# Patient Record
Sex: Female | Born: 1945 | Race: White | Hispanic: No | Smoking: Former smoker
Health system: Southern US, Community
[De-identification: ages and names within clinical notes are randomized; demographics above are authoritative.]

## PROBLEM LIST (undated history)

## (undated) DIAGNOSIS — Z8679 Personal history of other diseases of the circulatory system: Secondary | ICD-10-CM

## (undated) DIAGNOSIS — E871 Hypo-osmolality and hyponatremia: Secondary | ICD-10-CM

## (undated) DIAGNOSIS — F329 Major depressive disorder, single episode, unspecified: Secondary | ICD-10-CM

## (undated) DIAGNOSIS — F32A Depression, unspecified: Secondary | ICD-10-CM

## (undated) DIAGNOSIS — Z9189 Other specified personal risk factors, not elsewhere classified: Secondary | ICD-10-CM

## (undated) DIAGNOSIS — D649 Anemia, unspecified: Secondary | ICD-10-CM

## (undated) DIAGNOSIS — IMO0002 Reserved for concepts with insufficient information to code with codable children: Secondary | ICD-10-CM

## (undated) DIAGNOSIS — S32432A Displaced fracture of anterior column [iliopubic] of left acetabulum, initial encounter for closed fracture: Secondary | ICD-10-CM

## (undated) DIAGNOSIS — M329 Systemic lupus erythematosus, unspecified: Secondary | ICD-10-CM

## (undated) DIAGNOSIS — J449 Chronic obstructive pulmonary disease, unspecified: Secondary | ICD-10-CM

## (undated) DIAGNOSIS — G2581 Restless legs syndrome: Secondary | ICD-10-CM

## (undated) DIAGNOSIS — S329XXA Fracture of unspecified parts of lumbosacral spine and pelvis, initial encounter for closed fracture: Secondary | ICD-10-CM

## (undated) DIAGNOSIS — I10 Essential (primary) hypertension: Secondary | ICD-10-CM

## (undated) HISTORY — PX: APPENDECTOMY: SHX54

## (undated) HISTORY — PX: HIP SURGERY: SHX245

## (undated) HISTORY — DX: Essential (primary) hypertension: I10

## (undated) HISTORY — DX: Displaced fracture of anterior column (iliopubic) of left acetabulum, initial encounter for closed fracture: S32.432A

## (undated) HISTORY — DX: Fracture of unspecified parts of lumbosacral spine and pelvis, initial encounter for closed fracture: S32.9XXA

## (undated) HISTORY — DX: Other specified personal risk factors, not elsewhere classified: Z91.89

## (undated) HISTORY — DX: Anemia, unspecified: D64.9

## (undated) HISTORY — PX: JOINT REPLACEMENT: SHX530

## (undated) HISTORY — PX: TONSILLECTOMY: SUR1361

## (undated) HISTORY — DX: Restless legs syndrome: G25.81

## (undated) HISTORY — PX: ABDOMINAL HYSTERECTOMY: SHX81

## (undated) HISTORY — PX: BREAST SURGERY: SHX581

## (undated) HISTORY — DX: Hypo-osmolality and hyponatremia: E87.1

---
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
## 2019-07-18 ENCOUNTER — Telehealth: Payer: Self-pay

## 2019-07-18 NOTE — Telephone Encounter (Signed)
Patient's daughter called to schedule an appointment with Dr. Aline Brochure for a follow up. Stated mom broke her hip back in November and has moved to [place] from up [place]. I spoke with Arbie Cookey to be sure of what is needed. I told the daughter(Jill) everything we need for Dr. Aline Brochure to review before scheduling and after reviewing I would call her.. She stated she understood and would see what she could do about getting the information needed.

## 2019-07-28 ENCOUNTER — Other Ambulatory Visit: Payer: Self-pay

## 2019-07-28 ENCOUNTER — Encounter: Payer: Self-pay | Admitting: Emergency Medicine

## 2019-07-28 ENCOUNTER — Emergency Department
Admission: EM | Admit: 2019-07-28 | Discharge: 2019-07-28 | Disposition: A | Discharge status: Home / Self Care | Payer: Medicare Other | Attending: Student in an Organized Health Care Education/Training Program | Admitting: Student in an Organized Health Care Education/Training Program

## 2019-07-28 DIAGNOSIS — J449 Chronic obstructive pulmonary disease, unspecified: Secondary | ICD-10-CM | POA: Diagnosis not present

## 2019-07-28 DIAGNOSIS — R252 Cramp and spasm: Secondary | ICD-10-CM | POA: Insufficient documentation

## 2019-07-28 DIAGNOSIS — Z87891 Personal history of nicotine dependence: Secondary | ICD-10-CM | POA: Insufficient documentation

## 2019-07-28 DIAGNOSIS — M79669 Pain in unspecified lower leg: Secondary | ICD-10-CM | POA: Diagnosis present

## 2019-07-28 HISTORY — DX: Depression, unspecified: F32.A

## 2019-07-28 HISTORY — DX: Chronic obstructive pulmonary disease, unspecified: J44.9

## 2019-07-28 HISTORY — DX: Reserved for concepts with insufficient information to code with codable children: IMO0002

## 2019-07-28 HISTORY — DX: Systemic lupus erythematosus, unspecified: M32.9

## 2019-07-28 NOTE — ED Triage Notes (Addendum)
C/O bilateral calf pain x 10 min.  Patient states she had left hip surgery 06/11/19.  Denies all other complaint.  Patient states she just moved to [facility] from [place], one week ago.

## 2019-07-28 NOTE — ED Provider Notes (Signed)
[facility] Emergency Department Provider Note  ____________________________________________  Time seen: Approximately 5:24 PM  I have reviewed the triage vital signs and the nursing notes.   HISTORY  Chief Complaint Leg Pain    HPI Sharon Jordan is a 73 y.o. female who presents the emergency department complaining of intermittent bilateral calf pain.  Patient states that she had hip surgery 1-1/2 months ago.  Patient been informed that if she develops calf pain she should be seen because she could develop a clot.  Patient states that she has been asymptomatic until today.  Patient states that she had approximately 10 minutes of cramping to the left calf, then 10 minutes of cramping to the right calf, is currently asymptomatic.  Patient denies any other injury or complaint at this time.  Patient is concerned given her calf pain in the setting of recent surgery.         Past Medical History:  Diagnosis Date  . COPD (chronic obstructive pulmonary disease) (HCC)   . Depression   . Lupus (HCC)     There are no problems to display for this patient.   Past Surgical History:  Procedure Laterality Date  . ABDOMINAL HYSTERECTOMY    . HIP SURGERY Left     Prior to Admission medications   Not on File    Allergies Penicillins  No family history on file.  Social History Social History   Tobacco Use  . Smoking status: Former Games developer  . Smokeless tobacco: Never Used  Substance Use Topics  . Alcohol use: Not on file  . Drug use: Not on file     Review of Systems  Constitutional: No fever/chills Eyes: No visual changes. No discharge ENT: No upper respiratory complaints. Cardiovascular: no chest pain. Respiratory: no cough. No SOB. Gastrointestinal: No abdominal pain.  No nausea, no vomiting.  No diarrhea.  No constipation. Musculoskeletal: Intermittent bilateral calf pain Skin: Negative for rash, abrasions, lacerations,  ecchymosis. Neurological: Negative for headaches, focal weakness or numbness. 10-point ROS otherwise negative.  ____________________________________________   PHYSICAL EXAM:  VITAL SIGNS: ED Triage Vitals  Enc Vitals Group     BP 07/28/19 1653 (!) 122/59     Pulse Rate 07/28/19 1653 95     Resp 07/28/19 1653 16     Temp 07/28/19 1653 98.2 F (36.8 C)     Temp Source 07/28/19 1653 Oral     SpO2 07/28/19 1653 97 %     Weight 07/28/19 1653 115 lb (52.2 kg)     Height 07/28/19 1653 5' (1.524 m)     Head Circumference --      Peak Flow --      Pain Score 07/28/19 1657 3     Pain Loc --      Pain Edu? --      Excl. in GC? --      Constitutional: Alert and oriented. Well appearing and in no acute distress. Eyes: Conjunctivae are normal. PERRL. EOMI. Head: Atraumatic. ENT:      Ears:       Nose: No congestion/rhinnorhea.      Mouth/Throat: Mucous membranes are moist.  Neck: No stridor.    Cardiovascular: Normal rate, regular rhythm. Normal S1 and S2.  Good peripheral circulation. Respiratory: Normal respiratory effort without tachypnea or retractions. Lungs CTAB. Good air entry to the bases with no decreased or absent breath sounds. Musculoskeletal: Full range of motion to all extremities. No gross deformities appreciated.  Visualization of bilateral calfs  reveal no gross abnormality.  Examination of the left calf reveals no tenderness.  No palpable abnormality.  No warmth.  No edema.  Full range of motion to the knee, joint.  Dorsalis pedis pulse and sensation intact.  Visualization of the right lower extremity reveals no erythema, edema.  Full range of motion to all joints right lower extremity.  Patient has no tenderness to palpation over the calf muscle.  No palpable abnormality.  Dorsalis pedis pulses sensation intact distally. Neurologic:  Normal speech and language. No gross focal neurologic deficits are appreciated.  Skin:  Skin is warm, dry and intact. No rash  noted. Psychiatric: Mood and affect are normal. Speech and behavior are normal. Patient exhibits appropriate insight and judgement.   ____________________________________________   LABS (all labs ordered are listed, but only abnormal results are displayed)  Labs Reviewed - No data to display ____________________________________________  EKG   ____________________________________________  RADIOLOGY   No results found.  ____________________________________________    PROCEDURES  Procedure(s) performed:    Procedures    Medications - No data to display    ____________________________________________   INITIAL IMPRESSION / ASSESSMENT AND PLAN / ED COURSE  Pertinent labs & imaging results that were available during my care of the patient were reviewed by me and considered in my medical decision making (see chart for details).  Review of the [place] CSRS was performed in accordance of the [place] prior to dispensing any controlled drugs.           Patient's diagnosis is consistent with muscle cramps.  Patient presented to emergency department with approximately 10 minutes of cramping to bilateral lower extremities.  Patient admits to not drinking plenty of fluids, has been less active since her hip surgery as well.  There is no erythema, edema.  No ongoing pain complaints.  At this time, patient appears to be low risk for DVT.  I have discussed imaging with the patient.  Given the significant amount of weight time, patient states that she would rather be discharged, drink fluids and eat a banana at home.  I have discussed signs and symptoms to be concerned formed patient verbalizes understanding.  This includes feeling like her heart is racing, chest pain, pleuritic pain, shortness of breath, unilateral edema, erythema, worsening pain.  Patient verbalizes understanding and will return if this occurs..  With primary care and orthopedic surgery as needed.  Patient is given ED  precautions to return to the ED for any worsening or new symptoms.     ____________________________________________  FINAL CLINICAL IMPRESSION(S) / ED DIAGNOSES  Final diagnoses:  Muscle cramps      NEW MEDICATIONS STARTED DURING THIS VISIT:  ED Discharge Orders    None          This chart was dictated using voice recognition software/Dragon. Despite best efforts to proofread, errors can occur which can change the meaning. Any change was purely unintentional.    Darletta Moll, PA-C 07/28/19 1728    Merlyn Lot, MD 07/28/19 [id number]

## 2019-07-28 NOTE — ED Notes (Signed)
AAOx3.  Skin warm and dry.  NAD 

## 2019-08-01 HISTORY — PX: OTHER SURGICAL HISTORY: SHX169

## 2019-08-04 ENCOUNTER — Ambulatory Visit (HOSPITAL_COMMUNITY)
Admission: RE | Admit: 2019-08-04 | Discharge: 2019-08-04 | Disposition: A | Discharge status: Home / Self Care | Payer: Medicare Other | Source: Ambulatory Visit | Attending: Family Medicine | Admitting: Family Medicine

## 2019-08-04 ENCOUNTER — Encounter (HOSPITAL_COMMUNITY): Payer: Self-pay

## 2019-08-04 ENCOUNTER — Other Ambulatory Visit (HOSPITAL_COMMUNITY): Payer: Self-pay | Admitting: Family Medicine

## 2019-08-04 ENCOUNTER — Other Ambulatory Visit: Payer: Self-pay

## 2019-08-04 DIAGNOSIS — M5440 Lumbago with sciatica, unspecified side: Secondary | ICD-10-CM | POA: Diagnosis present

## 2019-08-04 DIAGNOSIS — M25552 Pain in left hip: Secondary | ICD-10-CM | POA: Insufficient documentation

## 2019-08-04 DIAGNOSIS — M542 Cervicalgia: Secondary | ICD-10-CM

## 2019-08-08 ENCOUNTER — Encounter: Payer: Self-pay | Admitting: Orthopedic Surgery

## 2019-08-23 DIAGNOSIS — S329XXA Fracture of unspecified parts of lumbosacral spine and pelvis, initial encounter for closed fracture: Secondary | ICD-10-CM | POA: Insufficient documentation

## 2019-08-26 DIAGNOSIS — Z9189 Other specified personal risk factors, not elsewhere classified: Secondary | ICD-10-CM | POA: Insufficient documentation

## 2019-08-26 DIAGNOSIS — E871 Hypo-osmolality and hyponatremia: Secondary | ICD-10-CM | POA: Insufficient documentation

## 2019-08-26 DIAGNOSIS — D649 Anemia, unspecified: Secondary | ICD-10-CM | POA: Insufficient documentation

## 2019-08-29 ENCOUNTER — Other Ambulatory Visit: Payer: Self-pay | Admitting: Internal Medicine

## 2019-08-29 MED ORDER — HYDROCODONE-ACETAMINOPHEN 5-325 MG PO TABS: 1.0000 | ORAL_TABLET | Freq: Four times a day (QID) | ORAL | 0 refills | Status: AC | PRN

## 2019-08-29 MED ORDER — CLONAZEPAM 0.5 MG PO TABS: 0.5000 mg | ORAL_TABLET | Freq: Two times a day (BID) | ORAL | 0 refills | Status: DC | PRN

## 2019-09-01 ENCOUNTER — Non-Acute Institutional Stay (SKILLED_NURSING_FACILITY): Payer: Medicare Other | Admitting: Internal Medicine

## 2019-09-01 ENCOUNTER — Encounter: Payer: Self-pay | Admitting: Internal Medicine

## 2019-09-01 DIAGNOSIS — J449 Chronic obstructive pulmonary disease, unspecified: Secondary | ICD-10-CM

## 2019-09-01 DIAGNOSIS — S32435S Nondisplaced fracture of anterior column [iliopubic] of left acetabulum, sequela: Secondary | ICD-10-CM | POA: Diagnosis not present

## 2019-09-01 DIAGNOSIS — K219 Gastro-esophageal reflux disease without esophagitis: Secondary | ICD-10-CM

## 2019-09-01 DIAGNOSIS — I10 Essential (primary) hypertension: Secondary | ICD-10-CM | POA: Diagnosis not present

## 2019-09-01 DIAGNOSIS — G2581 Restless legs syndrome: Secondary | ICD-10-CM

## 2019-09-01 DIAGNOSIS — F32A Depression, unspecified: Secondary | ICD-10-CM

## 2019-09-01 DIAGNOSIS — S72142S Displaced intertrochanteric fracture of left femur, sequela: Secondary | ICD-10-CM | POA: Diagnosis not present

## 2019-09-01 DIAGNOSIS — F329 Major depressive disorder, single episode, unspecified: Secondary | ICD-10-CM

## 2019-09-01 NOTE — Progress Notes (Signed)
: Provider:  Margit Hanks., MD Location:  [facility] Room Number: 501-P Place of Service:  SNF (31)  PCP: [name], MD Patient Care Team: [name], MD as PCP - General (Internal Medicine)  Extended Emergency Contact Information Primary Emergency Contact: Kerby Moors Mobile Phone: [phone] Relation: Daughter Preferred language: English Interpreter needed? No     Allergies: Penicillins  Chief Complaint  Patient presents with  . New Admit To SNF    New admission to [facility] SNF    HPI: Patient is a 74 y.o. female with depression, COPD on 2.5 L O2 nightly, SLE, not on medications , restless leg syndrome, hypertension, with a history of multiple falls including a recent left hip fracture November 2020 presented after a fall with new left hip pain.  Imaging revealed a left intertrochanteric hip fracture, left acetabular fracture and pelvic fractures.  Patient was admitted, [facility] from 1/23-29 where 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  Patient had no exacerbation of COPD and no delirium.  Patient is admitted to skilled nursing facility for OT/PT.  While at skilled nursing facility patient will be followed for hypertension treated with Norvasc, depression treated with Wellbutrin and GERD treated with Protonix.  Past Medical History:  Diagnosis Date  . Anemia   . At risk for delirium   . Closed fracture of anterior column of left acetabulum (HCC)   . Closed fracture of pelvis (HCC)   . COPD (chronic obstructive pulmonary disease) (HCC)   . Depression   . HTN (hypertension)   . Hyponatremia   . Lupus (HCC)   . RLS (restless legs syndrome)     Past Surgical History:  Procedure Laterality Date  . ABDOMINAL HYSTERECTOMY    . CLOSED MANIPULATION AND CLOSED  TREATMENT OF LEFT ACETABULUM FRACTURE, BOTH COLUMNS Left   . HIP SURGERY Left   . REMOVAL OF DEEP HARDWARE, LEFT INTRAMEDULLARY NAIL Left    TFN  . 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      Medication List       Accurate as of September 01, 2019 11:59 PM. If you have any questions, ask your nurse or doctor.        albuterol (2.5 MG/3ML) 0.083% nebulizer solution Commonly known as: PROVENTIL Take 2.5 mg by nebulization every 8 (eight) hours as needed for wheezing or shortness of breath.   amLODipine 5 MG tablet Commonly known as: NORVASC Take 5 mg by mouth daily.   aspirin EC 81 MG tablet Take 81 mg by mouth 2 (two) times daily. QAM and QHS   bisacodyl 10 MG suppository Commonly known as: DULCOLAX Place 10 mg rectally as needed for moderate constipation (Constopation not relieved by MOM).   Breztri Aerosphere 160-9-4.8 MCG/ACT Aero Generic drug: Budeson-Glycopyrrol-Formoterol Inhale 1 puff into the lungs.   buPROPion 300 MG 24 hr tablet Commonly known as: WELLBUTRIN XL Take 300 mg by mouth daily.   clonazePAM 0.5 MG tablet Commonly known as: KLONOPIN Take 1 tablet (0.5 mg total) by mouth 2 (two) times daily as needed for anxiety.   dextromethorphan-guaiFENesin 30-600 MG 12hr tablet Commonly known  as: MUCINEX DM Take 1 tablet by mouth 2 (two) times daily as needed for cough.   docusate sodium 100 MG capsule Commonly known as: COLACE Take 100 mg by mouth daily.   enoxaparin 30 MG/0.3ML injection Commonly known as: LOVENOX Inject 30 mg into the skin every 12 (twelve) hours.   HYDROcodone-acetaminophen 5-325 MG tablet Commonly known as: Norco Take 1-2 tablets by mouth every 6 (six) hours as needed for up to 7 days for moderate pain. 1 for mod pain, 2 for severe     magnesium hydroxide 400 MG/5ML suspension Commonly known as: MILK OF MAGNESIA Take 30 mLs by mouth daily as needed for mild constipation.   pantoprazole 40 MG tablet Commonly known as: PROTONIX Take 40 mg by mouth daily.   RA SALINE ENEMA RE Place 1 each rectally daily as needed (Constipation not relieved by bisacodyl).   rOPINIRole 2 MG tablet Commonly known as: REQUIP Take 2 mg by mouth at bedtime.   Vitamin D-3 125 MCG (5000 UT) Tabs Take by mouth. Give 5,000 units by mouth daily       No orders of the defined types were placed in this encounter.    There is no immunization history on file for this patient.  Social History   Tobacco Use  . Smoking status: Former Games developer  . Smokeless tobacco: Never Used  Substance Use Topics  . Alcohol use: Not on file    Family history is   History reviewed. No pertinent family history.    Review of Systems  GENERAL:  no fevers, fatigue, appetite changes SKIN: No itching, or rash EYES: No eye pain, redness, discharge EARS: No earache, tinnitus, change in hearing NOSE: No congestion, drainage or bleeding  MOUTH/THROAT: No mouth or tooth pain, No sore throat RESPIRATORY: No cough, wheezing, SOB CARDIAC: No chest pain, palpitations, lower extremity edema  GI: No abdominal pain, No N/V/D or constipation, No heartburn or reflux  GU: No dysuria, frequency or urgency, or incontinence  MUSCULOSKELETAL: No unrelieved bone/joint pain NEUROLOGIC: No headache, dizziness or focal weakness PSYCHIATRIC: No c/o anxiety or sadness   Vitals:   09/01/19 1219  BP: 120/69  Pulse: 97  Resp: 18  Temp: (!) 96.9 F (36.1 C)  SpO2: 99%    SpO2 Readings from Last 1 Encounters:  09/11/19 98%   Body mass index is 22.46 kg/m.     Physical Exam  GENERAL APPEARANCE: Alert, conversant,  No acute distress.  SKIN: No diaphoresis rash HEAD: Normocephalic, atraumatic  EYES: Conjunctiva/lids clear. Pupils round, reactive. EOMs intact.   EARS: External exam WNL, canals clear. Hearing grossly normal.  NOSE: No deformity or discharge.  MOUTH/THROAT: Lips w/o lesions  RESPIRATORY: Breathing is even, unlabored. Lung sounds are clear   CARDIOVASCULAR: Heart RRR no murmurs, rubs or gallops. No peripheral edema.   GASTROINTESTINAL: Abdomen is soft, non-tender, not distended w/ normal bowel sounds. GENITOURINARY: Bladder non tender, not distended  MUSCULOSKELETAL: No abnormal joints or musculature NEUROLOGIC:  Cranial nerves 2-12 grossly intact. Moves all extremities  PSYCHIATRIC: Mood and affect appropriate to situation, no behavioral issues  Patient Active Problem List   Diagnosis Date Noted  . Intertrochanteric fracture of left femur (HCC) 09/05/2019  . Fracture of acetabulum, left, closed (HCC) 09/05/2019  . Hypertension 09/05/2019  . COPD (chronic obstructive pulmonary disease) (HCC) 09/05/2019  . Depression 09/05/2019  . GERD (gastroesophageal reflux disease) 09/05/2019  . Restless leg syndrome 09/05/2019      Labs reviewed: Basic Metabolic Panel:  Component Value Date/Time   NA 136 (A) 09/02/2019 0000   K 4.9 09/02/2019 0000   CL 97 (A) 09/02/2019 0000   CO2 32 (A) 09/02/2019 0000   BUN 13 09/02/2019 0000   CALCIUM 8.4 (A) 09/02/2019 0000   GFRNONAA >90 09/02/2019 0000   GFRAA >90 09/02/2019 0000    Recent Labs    09/02/19 0000  NA 136*  K 4.9  CL 97*  CO2 32*  BUN 13  CALCIUM 8.4*   Liver Function Tests: No results for input(s): AST, ALT, ALKPHOS, BILITOT, PROT, ALBUMIN in the last 8760 hours. No results for input(s): LIPASE, AMYLASE in the last 8760 hours. No results for input(s): AMMONIA in the last 8760 hours. CBC: Recent Labs    09/02/19 0000 09/08/19 0000  WBC 7.4 6.0  HGB 7.3* 7.9*  HCT 22* 24*  PLT 409* 476*   Lipid No results for input(s): CHOL, HDL, LDLCALC, TRIG in the last 8760 hours.  Cardiac Enzymes: No results for input(s): CKTOTAL, CKMB, CKMBINDEX, TROPONINI in the  last 8760 hours. BNP: No results for input(s): BNP in the last 8760 hours. No results found for: MICROALBUR No results found for: HGBA1C No results found for: TSH No results found for: VITAMINB12 No results found for: FOLATE No results found for: IRON, TIBC, FERRITIN  Imaging and Procedures obtained prior to SNF admission: DG Cervical Spine Complete  Result Date: 08/04/2019 CLINICAL DATA:  Recent fall with neck pain, initial encounter EXAM: CERVICAL SPINE - COMPLETE 4+ VIEW COMPARISON:  None. FINDINGS: Seven cervical segments are well visualized. Multilevel disc space narrowing from C3 to C7 is seen. Multilevel facet hypertrophic changes are noted. No significant neural foraminal narrowing is seen. No acute fracture or acute facet abnormality is noted. No prevertebral soft tissue changes are noted. The odontoid is within normal limits. IMPRESSION: Multilevel degenerative change without acute abnormality. Electronically Signed   By: Inez Catalina M.D.   On: 08/04/2019 15:34   DG Lumbar Spine Complete  Result Date: 08/04/2019 CLINICAL DATA:  Low back pain, initial encounter EXAM: LUMBAR SPINE - COMPLETE 4+ VIEW COMPARISON:  None. FINDINGS: Five lumbar type vertebral bodies are well visualized. L1 compression deformity is noted which appears chronic in nature. No pars defects are seen. Multilevel disc space narrowing is noted. No soft tissue abnormality is noted. Prior left hip fracture with fixation is seen. IMPRESSION: Degenerative change without acute abnormality. Chronic appearing L1 compression deformity. Electronically Signed   By: Inez Catalina M.D.   On: 08/04/2019 15:35   DG HIP UNILAT WITH PELVIS 2-3 VIEWS LEFT  Result Date: 08/04/2019 CLINICAL DATA:  Fall several weeks ago with left hip pain, initial encounter EXAM: DG HIP (WITH OR WITHOUT PELVIS) 2-3V LEFT COMPARISON:  None. FINDINGS: Changes consistent with prior intratrochanteric fracture are noted with medullary rod placement and fixation  screw placement. Fracture fragments are in near anatomic alignment. Pelvic ring appears intact. Degenerative changes of lumbar spine are noted. No acute abnormality is seen. IMPRESSION: Prior left proximal femoral fracture with fixation. No significant callus formation is noted. Electronically Signed   By: Inez Catalina M.D.   On: 08/04/2019 15:33     Not all labs, radiology exams or other studies done during hospitalization come through on my EPIC note; however they are reviewed by me.    Assessment and Plan  Left intertrochanteric fracture/left acetabular fracture-status post repair of left peritrochanteric nonunion with use of left leg plate, removal of deep hardware left intramedullary nail, closed  manipulation and closed treatment of left acetabular fracture, both problems; there were no complications prophylaxed with Lovenox 30 mg every 12 hours with last dose being 10/07/2019 SNF-admitted for OT/PT; continue Lovenox 30 mg every 12, last dose 10/07/2019  Hypertension SNF-controlled; continue Norvasc 5 mg daily  COPD SNF-without exacerbation; continue O2 2.5 L nightly and as needed along with budesonide formoterol 1 puff daily and Mucinex DM 30-60 mg 1 every 12 as needed  Depression SNF-appears controlled; continue Wellbutrin 300 mg daily  GERD  SNF-Good control; continue Protonix 40 mg daily  Restless leg syndrome SNF-no complaints; continue Requip 2 mg nightly  Insomnia  SNF-start trazodone 25 mg p.o. nightly which patient says she has used before   Time spent greater than 45 minutes;> 50% of time with patient was spent reviewing records, labs, tests and studies, counseling and developing plan of care  Margit Hanks, MD

## 2019-09-02 LAB — BASIC METABOLIC PANEL
BUN: 13 (ref 4–21)
CO2: 32 — AB (ref 13–22)
Chloride: 97 — AB (ref 99–108)
Glucose: 80
Potassium: 4.9 (ref 3.4–5.3)
Sodium: 136 — AB (ref 137–147)

## 2019-09-02 LAB — COMPREHENSIVE METABOLIC PANEL
Calcium: 8.4 — AB (ref 8.7–10.7)
GFR calc Af Amer: >90
GFR calc non Af Amer: >90

## 2019-09-02 LAB — CBC AND DIFFERENTIAL
HCT: 22 — AB (ref 36–46)
Hemoglobin: 7.3 — AB (ref 12.0–16.0)
Platelets: 409 — AB (ref 150–399)
WBC: 7.4

## 2019-09-02 LAB — CBC: RBC: 2.31 — AB (ref 3.87–5.11)

## 2019-09-03 ENCOUNTER — Encounter: Payer: Self-pay | Admitting: Internal Medicine

## 2019-09-05 ENCOUNTER — Encounter: Payer: Self-pay | Admitting: Internal Medicine

## 2019-09-05 DIAGNOSIS — G2581 Restless legs syndrome: Secondary | ICD-10-CM | POA: Insufficient documentation

## 2019-09-05 DIAGNOSIS — S72142A Displaced intertrochanteric fracture of left femur, initial encounter for closed fracture: Secondary | ICD-10-CM | POA: Insufficient documentation

## 2019-09-05 DIAGNOSIS — S32402A Unspecified fracture of left acetabulum, initial encounter for closed fracture: Secondary | ICD-10-CM | POA: Insufficient documentation

## 2019-09-05 DIAGNOSIS — J449 Chronic obstructive pulmonary disease, unspecified: Secondary | ICD-10-CM | POA: Insufficient documentation

## 2019-09-05 DIAGNOSIS — I10 Essential (primary) hypertension: Secondary | ICD-10-CM | POA: Insufficient documentation

## 2019-09-05 DIAGNOSIS — K219 Gastro-esophageal reflux disease without esophagitis: Secondary | ICD-10-CM | POA: Insufficient documentation

## 2019-09-05 DIAGNOSIS — F32A Depression, unspecified: Secondary | ICD-10-CM | POA: Insufficient documentation

## 2019-09-05 DIAGNOSIS — F329 Major depressive disorder, single episode, unspecified: Secondary | ICD-10-CM | POA: Insufficient documentation

## 2019-09-08 LAB — CBC AND DIFFERENTIAL
HCT: 24 — AB (ref 36–46)
Hemoglobin: 7.9 — AB (ref 12.0–16.0)
Platelets: 476 — AB (ref 150–399)
WBC: 6

## 2019-09-08 LAB — CBC: RBC: 2.5 — AB (ref 3.87–5.11)

## 2019-09-11 ENCOUNTER — Encounter: Payer: Self-pay | Admitting: Internal Medicine

## 2019-09-11 ENCOUNTER — Non-Acute Institutional Stay (SKILLED_NURSING_FACILITY): Payer: Medicare Other | Admitting: Internal Medicine

## 2019-09-11 DIAGNOSIS — J449 Chronic obstructive pulmonary disease, unspecified: Secondary | ICD-10-CM | POA: Diagnosis not present

## 2019-09-11 DIAGNOSIS — G4700 Insomnia, unspecified: Secondary | ICD-10-CM | POA: Diagnosis not present

## 2019-09-11 DIAGNOSIS — S72142S Displaced intertrochanteric fracture of left femur, sequela: Secondary | ICD-10-CM | POA: Diagnosis not present

## 2019-09-11 DIAGNOSIS — F329 Major depressive disorder, single episode, unspecified: Secondary | ICD-10-CM | POA: Diagnosis not present

## 2019-09-11 DIAGNOSIS — I10 Essential (primary) hypertension: Secondary | ICD-10-CM

## 2019-09-11 DIAGNOSIS — F32A Depression, unspecified: Secondary | ICD-10-CM

## 2019-09-11 NOTE — Progress Notes (Signed)
Location:    [name] Room Number: 501/P Place of Service:  SNF [phone]) Provider:  Velva Harman, MD  Patient Care Team: [name], MD as PCP - General (Internal Medicine)  Extended Emergency Contact Information Primary Emergency Contact: Kerby Moors Mobile Phone: [phone] Relation: Daughter Preferred language: English Interpreter needed? No  Code Status:  Full Code Goals of care: Advanced Directive information Advanced Directives 09/11/2019  Does Patient Have a Medical Advance Directive? Yes  Type of Advance Directive (No Data)  Does patient want to make changes to medical advance directive? No - Patient declined  Would patient like information on creating a medical advance directive? -    Chief complaint-acute visit to follow-up history of recent left hip fracture as well as follow-up depression-insomnia-constipation-COPD   HPI:  Pt is a 74 y.o. female seen today for an acute visit for several issues as noted above.  Patient was recently admitted here for rehab after sustaining a fall-she had previously had a recent left hip fracture that was repaired in November 2020.  But she subsequently apparently fell and had new left hip pain.  Imaging showed a left intertrochanteric hip fracture left acetabular fracture and a pelvic fracture.  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   Her COPD apparently was stable in the hospital.  She she was admitted here for PT and OT.  Her other diagnoses include hypertension which is treated with Norvasc and depression she is on Wellbutrin and she has GERD and is on Protonix for left.  Per discussion with social work yesterday patient appeared to be depressed.  I did speak with her this morning she did express some frustration with  the recent orthopedic issues.  She denied any intention to harm herself-but says she would be open to having a psychiatric consult.  She does complain of some insomnia and says trazodone does not really help and actually makes her somewhat sedated we will start melatonin and she was agreeable to that  She also complains of some neck pain she feels may be muscle related.  She also complains of some constipation and would like to be put on Metamucil which she says was effective at home  She also complains of occasional sweating episodes and I told her we would check a TSH to begin with and go from there and she was okay with that.  She does have a history of COPD and does have orders for Mucinex as needed but she feels it would be more effective if she could receive this routinely.  Regards to her hip issues she does have Norco 5-325 mg a day every 6 hours as needed she is on this for 14-day course.  We are attempting to titrate this down before she goes home  She says this does help.  She is on Lovenox for anticoagulation.  She also would like her staples removed when possible and I have spoken with nursing about contacting orthopedics for follow-up on this  Currently she is resting in bed comfortably she is pleasant alert-vital signs appear to be stable.      Past Medical History:  Diagnosis Date  . Anemia   . At risk for delirium   . Closed fracture of anterior column of left acetabulum (HCC)   . Closed fracture of pelvis (HCC)   . COPD (  chronic obstructive pulmonary disease) (HCC)   . Depression   . HTN (hypertension)   . Hyponatremia   . Lupus (HCC)   . RLS (restless legs syndrome)    Past Surgical History:  Procedure Laterality Date  . ABDOMINAL HYSTERECTOMY    . CLOSED MANIPULATION AND CLOSED TREATMENT OF LEFT ACETABULUM FRACTURE, BOTH COLUMNS Left   . HIP SURGERY Left   . REMOVAL OF DEEP HARDWARE, LEFT INTRAMEDULLARY NAIL Left    TFN  . 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 Penicillins Diarrhea    Patient states she is allergic to 'all antibiotics' states has diarrhea and nausea.    Outpatient Encounter Medications as of 09/11/2019  Medication Sig  . acetaminophen (TYLENOL) 325 MG tablet Take 650 mg by mouth every 6 (six) hours as needed for mild pain.  Marland Kitchen albuterol (PROVENTIL) (2.5 MG/3ML) 0.083% nebulizer solution Take 2.5 mg by nebulization every 8 (eight) hours as needed for wheezing or shortness of breath.  Marland Kitchen amLODipine (NORVASC) 5 MG tablet Take 5 mg by mouth daily.  Marland Kitchen aspirin EC 81 MG tablet Take 81 mg by mouth 2 (two) times daily. QAM and QHS  . bisacodyl (DULCOLAX) 10 MG suppository Place 10 mg rectally as needed for moderate constipation (Constopation not relieved by MOM).  . Budeson-Glycopyrrol-Formoterol (BREZTRI AEROSPHERE) 160-9-4.8 MCG/ACT AERO Inhale 1 puff into the lungs.  Marland Kitchen buPROPion (WELLBUTRIN XL) 300 MG 24 hr tablet Take 300 mg by mouth daily.  . Carboxymethylcellulose Sodium (ARTIFICIAL TEARS OP) Instill 2 drops both eyes three times a day as needed for dry eyes  . Cholecalciferol (VITAMIN D-3) 125 MCG (5000 UT) TABS Take by mouth. Give 5,000 units by mouth daily  . clonazePAM (KLONOPIN) 0.5 MG tablet Take 1 tablet (0.5 mg total) by mouth 2 (two) times daily as needed for anxiety.  Marland Kitchen dextromethorphan-guaiFENesin (MUCINEX DM) 30-600 MG 12hr tablet Take 1 tablet by mouth 2 (two) times daily as needed for cough.  . docusate sodium (COLACE) 100 MG capsule Take 100 mg by mouth daily.  Marland Kitchen enoxaparin (LOVENOX) 30 MG/0.3ML injection Inject 30 mg into the skin every 12 (twelve) hours.  . ferrous sulfate 325 (65 FE) MG tablet Take 325 mg by mouth daily with breakfast. For Anemia  . HYDROcodone-acetaminophen (NORCO/VICODIN) 5-325 MG tablet Take 1 tablet by mouth every 6 (six) hours as needed for moderate pain.  . magnesium hydroxide (MILK OF MAGNESIA)  400 MG/5ML suspension Take 30 mLs by mouth daily as needed for mild constipation.  . ondansetron (ZOFRAN) 4 MG tablet Take 4 mg by mouth every 6 (six) hours as needed for nausea or vomiting.  . pantoprazole (PROTONIX) 40 MG tablet Take 40 mg by mouth daily.  Marland Kitchen rOPINIRole (REQUIP) 0.25 MG tablet Take 0.25 mg by mouth every morning.  Marland Kitchen rOPINIRole (REQUIP) 2 MG tablet Take 2 mg by mouth at bedtime.  . Sodium Phosphates (RA SALINE ENEMA RE) Place 1 each rectally daily as needed (Constipation not relieved by bisacodyl).  . traZODone (DESYREL) 50 MG tablet Take 25 mg by mouth at bedtime.   No facility-administered encounter medications on file as of 09/11/2019.    Review of Systems   In general she is not complaining of any fever or chills.  Skin does not complain of rashes or itching would like to have her staples removed.  Head ears eyes nose mouth and throat is not complain of visual  changes or sore throat.  Respiratory does not complain of shortness of breath does have a history of COPD and would like Mucinex routine as an expectorant.  Cardiac does not complain of chest pain or increasing edema.  GI does have a history of constipation and would like to start Metamucil-.  GU is not complaining of dysuria.  Musculoskeletal at this point Norco apparently is helping with her hip discomfort-she does complain of some neck tightness she thinks is muscle related.  Neurologic does not complain of dizziness headache numbness does have some weakness.  And psych does have a history of depression on Wellbutrin--is frustrated by recent fall with orthopedic issues   There is no immunization history on file for this patient. Pertinent  Health Maintenance Due  Topic Date Due  . COLONOSCOPY  06/03/1994  . DEXA SCAN  06/03/2009  . PNA vac Low Risk Adult (1 of 2 - PCV13) 06/03/2009  . INFLUENZA VACCINE  03/01/2019   No flowsheet data found. Functional Status Survey:    Vitals:   09/11/19  1019  BP: 140/90  Pulse: 92  Resp: 18  Temp: 98 F (36.7 C)  TempSrc: Oral  SpO2: 98%  Weight: 121 lb 6.4 oz (55.1 kg)  Height: 5' (1.524 m)   Body mass index is 23.71 kg/m. Physical Exam   In general this is a pleasant elderly female in no distress lying comfortably in bed.  Her skin is warm and dry.  Left hip surgical site is partially covered there are some staples exposed they appear to be in place with well approximated surgical scar-I do not see any drainage bleeding or concerning erythema at this time.  Eyes visual acuity appears to be intact sclera and conjunctive are clear.-She has prescription lenses  Chest there is no labored breathing-she has some mild expiratory wheezing on deep inspiration.  Heart is regular rate and rhythm without murmur gallop or rub she has trace lower extremity edema bilaterally.  Pedal pulses are intact  Abdomen is soft nontender with positive bowel sounds.  Musculoskeletal moves upper extremities at baseline-left hip surgical site as noted above-she does have weakness of her lower extremity status post recent surgery.  Neurologic is grossly intact cannot appreciate lateralizing findings her speech is clear.  Psych she is alert and oriented pleasant and appropriate  Labs reviewed:  September 08, 2019.  WBC 6.0 hemoglobin 7.9 platelets 476.  September 02, 2019.  WBC 7.4 hemoglobin 7.3 platelets 409.  Sodium 136 potassium 4.9 BUN 13.2 creatinine 0.52.  Assessment and plan.  1.-History ofLeft intertrochanteric fracture/left acetabular fracture-status post repair of left peritrochanteric nonunion with use of left leg plate, removal of deep hardware left intramedullary nail, closed manipulation and closed treatment of left acetabular fracture --She continues on Lovenox through March 9 for DVT prophylaxis.  She continues with PT and OT-at this point pain appears to be controlled she is on Norco for the time being as needed for pain-- she  also has orders for Tylenol.  2.  History of depression she is on Wellbutrin-Per discussion above will order psychiatric consult she does not describe herself as being acutely depressed but is frustrated with her recent issues with the fall and the fractures which is understandable.  3.-History of COPD-she does not describe increased shortness of breath or cough from baseline-she does have orders for albuterol nebulizers as needed-she would like Mucinex made routine which we will do.  4.  History of insomnia she is on trazodone nightly she says  this is not very effective and actually makes her somewhat sedated-will discontinue the trazodone and start melatonin 3 mg nightly and monitor.  5.-History of constipation she says she does well with Metamucil says her last bowel movement was about 2 days ago-she is on Colace as well. Does not describe any acute abdominal pain but would like Metamucil added.  6.  History of neck pain she describes is more muscle related possibly-we will start low-dose Robaxin 250 mg 3 times daily as needed.  7.  History of occasional sweating episodes-she says this is somewhat intermittent will start by ordering a TSH.  As well as a metabolic panel  8.  History of anemia.  She is on iron hemoglobin appears to be rising it was 7.9 earlier this week we will continue to monitor.--Will update CBC  9.  History of hypertension she is on Norvasc 5 mg a day recent blood pressures range from 116-140 systolically will continue to monitor.   [id number] note greater than 40 minutes spent assessing patient-discussing her concerns at bedside-reviewing her chart and labs-discussing her status with nursing staff-and coordinating and formulating a plan of care for numerous diagnoses-of note greater than 50% of time spent coordinating a plan of care with input as noted above

## 2019-09-12 ENCOUNTER — Encounter: Payer: Self-pay | Admitting: Internal Medicine

## 2019-09-12 LAB — CBC: RBC: 2.65 — AB (ref 3.87–5.11)

## 2019-09-12 LAB — BASIC METABOLIC PANEL
BUN: 10 (ref 4–21)
CO2: 31 — AB (ref 13–22)
Chloride: 100 (ref 99–108)
Creatinine: 0.5 (ref 0.5–1.1)
Glucose: 76
Potassium: 4.6 (ref 3.4–5.3)
Sodium: 138 (ref 137–147)

## 2019-09-12 LAB — CBC AND DIFFERENTIAL
HCT: 3 — AB (ref 36–46)
Hemoglobin: 8.3 — AB (ref 12.0–16.0)
Platelets: 7 — AB (ref 150–399)
WBC: 4.2

## 2019-09-12 LAB — COMPREHENSIVE METABOLIC PANEL
Calcium: 8.7 (ref 8.7–10.7)
GFR calc Af Amer: >90
GFR calc non Af Amer: >90

## 2019-09-12 LAB — TSH: TSH: 1.49 (ref 0.41–5.90)

## 2019-09-24 ENCOUNTER — Non-Acute Institutional Stay (SKILLED_NURSING_FACILITY): Payer: Medicare Other | Admitting: Internal Medicine

## 2019-09-24 ENCOUNTER — Encounter: Payer: Self-pay | Admitting: Internal Medicine

## 2019-09-24 DIAGNOSIS — J449 Chronic obstructive pulmonary disease, unspecified: Secondary | ICD-10-CM

## 2019-09-24 DIAGNOSIS — D509 Iron deficiency anemia, unspecified: Secondary | ICD-10-CM

## 2019-09-24 DIAGNOSIS — S72142S Displaced intertrochanteric fracture of left femur, sequela: Secondary | ICD-10-CM | POA: Diagnosis not present

## 2019-09-24 DIAGNOSIS — G4700 Insomnia, unspecified: Secondary | ICD-10-CM | POA: Diagnosis not present

## 2019-09-24 DIAGNOSIS — G629 Polyneuropathy, unspecified: Secondary | ICD-10-CM | POA: Diagnosis not present

## 2019-09-24 NOTE — Progress Notes (Signed)
Location: [name] Room Number: 102/P Place of Service:  SNF [phone]) Provider:  Velva Harman, MD  Patient Care Team: [name], MD as PCP - General (Internal Medicine)  Extended Emergency Contact Information Primary Emergency Contact: Kerby Moors Mobile Phone: [phone] Relation: Daughter Preferred language: English Interpreter needed? No  Code Status:  Full Code Goals of care: Advanced Directive information Advanced Directives 09/24/2019  Does Patient Have a Medical Advance Directive? Yes  Type of Advance Directive (No Data)  Does patient want to make changes to medical advance directive? No - Patient declined  Would patient like information on creating a medical advance directive? -     Chief Complaint  Patient presents with  . Acute Visit    Insomnia & Nubness    HPI:  Pt is a 74 y.o. female seen today for an acute visit for follow-up of insomnia as well as complaints of numbness in her hands and lower legs.  Patient is here for rehab after hospital admission for a fall that resulted in a left intertrochanteric hip fracture left acetabular fracture and a pelvic fracture.  This was complicated with the previous left hip fracture that was repaired in November 2020.  She under went a repair with removal of the deep hardware left intramedullary nail and closed manipulation closed treatment of left acetabular fracture-.  She continues on Lovenox for DVT prophylaxis.  She also has some history of neuropathy and is on Neurontin 300 mg 3 times daily-however she states she is having some increased tingling numbness in her lower legs and hands-.  She also has complained of some insomnia in the past previously DC'd her trazodone and started her on melatonin.  However today she says she slept pretty well last night so we will continue the melatonin for now.  She had thought about asking for Ambien but I did tell her  that we are hesitant to start this especially if melatonin is effective.     Past Medical History:  Diagnosis Date  . Anemia   . At risk for delirium   . Closed fracture of anterior column of left acetabulum (HCC)   . Closed fracture of pelvis (HCC)   . COPD (chronic obstructive pulmonary disease) (HCC)   . Depression   . HTN (hypertension)   . Hyponatremia   . Lupus (HCC)   . RLS (restless legs syndrome)    Past Surgical History:  Procedure Laterality Date  . ABDOMINAL HYSTERECTOMY    . CLOSED MANIPULATION AND CLOSED TREATMENT OF LEFT ACETABULUM FRACTURE, BOTH COLUMNS Left   . HIP SURGERY Left   . REMOVAL OF DEEP HARDWARE, LEFT INTRAMEDULLARY NAIL Left    TFN  . 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 Penicillins Diarrhea    Patient states she is allergic to 'all antibiotics' states has diarrhea and nausea.    Outpatient Encounter Medications as of 09/24/2019  Medication Sig  . acetaminophen (TYLENOL) 325 MG tablet Take 650 mg by mouth every 6 (six) hours as needed for mild pain.  Marland Kitchen acetaminophen (TYLENOL) 500 MG tablet Take 1,000 mg by mouth every 6 (six) hours as needed (For Pain).  Marland Kitchen albuterol (PROVENTIL) (2.5 MG/3ML) 0.083% nebulizer solution Take 2.5 mg by nebulization every 8 (eight) hours as needed for wheezing or shortness of breath.  Marland Kitchen amLODipine (NORVASC) 5 MG tablet Take 5 mg by mouth  daily.  . aspirin EC 81 MG tablet Take 81 mg by mouth 2 (two) times daily. QAM and QHS  . bisacodyl (DULCOLAX) 10 MG suppository Place 10 mg rectally as needed for moderate constipation (Constopation not relieved by MOM).  . Budeson-Glycopyrrol-Formoterol (BREZTRI AEROSPHERE) 160-9-4.8 MCG/ACT AERO Inhale 1 puff into the lungs.  Marland Kitchen buPROPion (WELLBUTRIN XL) 300 MG 24 hr tablet Take 300 mg by mouth daily.  . calcium carbonate (TUMS - DOSED IN MG ELEMENTAL CALCIUM) 500 MG chewable tablet Chew 1 tablet by mouth  daily. With meals  . calcium carbonate (TUMS - DOSED IN MG ELEMENTAL CALCIUM) 500 MG chewable tablet Chew 1 tablet by mouth daily. TAKE 1 TABLET BY MOUTH FOUR TIMES DAILY AS NEEDED FOR HEARTBURN OR INDIGESTION  . Carboxymethylcellulose Sodium (ARTIFICIAL TEARS OP) Instill 2 drops both eyes three times a day as needed for dry eyes  . Cholecalciferol (VITAMIN D-3) 125 MCG (5000 UT) TABS Take by mouth. Give 5,000 units by mouth daily  . clonazePAM (KLONOPIN) 0.5 MG tablet Take 1 tablet (0.5 mg total) by mouth 2 (two) times daily as needed for anxiety.  Marland Kitchen dextromethorphan-guaiFENesin (MUCINEX DM) 30-600 MG 12hr tablet Take 1 tablet by mouth 2 (two) times daily.   Marland Kitchen docusate sodium (COLACE) 100 MG capsule Take 100 mg by mouth daily.  Marland Kitchen enoxaparin (LOVENOX) 30 MG/0.3ML injection Inject 30 mg into the skin every 12 (twelve) hours.  . ferrous sulfate 325 (65 FE) MG tablet Take 325 mg by mouth daily with breakfast. For Anemia  . gabapentin (NEURONTIN) 300 MG capsule Take 300 mg by mouth 3 (three) times daily. FOR NEUROPATHY (DO NOT CRUSH)  . magnesium hydroxide (MILK OF MAGNESIA) 400 MG/5ML suspension Take 30 mLs by mouth daily as needed for mild constipation.  . Melatonin 3 MG TABS Take 3 mg by mouth at bedtime.  . methocarbamol (ROBAXIN) 500 MG tablet TABLET TAKE 1 TABLET BY MOUTH TWICE A DAY FOR MUSCLE SPASMS  . methocarbamol (ROBAXIN) 500 MG tablet TAKE 1/2 TABLET (250MG ) BY MOUTH TWICE DAILY AS NEEDED FOR MUSCLE SPASMS  . ondansetron (ZOFRAN) 4 MG tablet Take 4 mg by mouth every 6 (six) hours as needed for nausea or vomiting.  . pantoprazole (PROTONIX) 40 MG tablet Take 40 mg by mouth daily.  . Psyllium (METAMUCIL PO) Take by mouth. TAKE 1 SCOOP DAILY FOR CONSTIPATION- HOLD FOR DIARRHEA  . rOPINIRole (REQUIP) 0.25 MG tablet TABLET TAKE 2 TABLETS (0.5MG ) BY MOUTH EVERY MORNING  . rOPINIRole (REQUIP) 2 MG tablet Take 2 mg by mouth at bedtime.  . simethicone (MYLICON) 80 MG chewable tablet Chew 80 mg by  mouth daily.  . Sodium Phosphates (RA SALINE ENEMA RE) Place 1 each rectally daily as needed (Constipation not relieved by bisacodyl).  . traZODone (DESYREL) 50 MG tablet Take 25 mg by mouth at bedtime.   No facility-administered encounter medications on file as of 09/24/2019.    Review of Systems General should not complain of any fever or chills.  Skin does not complain of itching rashes her staples have been removed.  Head ears eyes nose mouth and throat not complain of visual change sore throat.  Respiratory does not complain of shortness of breath or increased cough from baseline she does have a history of COPD she says the Mucinex is helping.  Cardiac does not complain of chest pain or increasing edema.  GI does not complain of abdominal pain nausea vomiting or constipation today she has been started on Metamucil per her  request.  GU is not complaining of dysuria.  Musculoskeletal at this point is not complaining of pain.  Neurologic as noted above does complain of increased numbness does not complain of headache or dizziness or syncope.  And psych does have some history of depression a psych consult is pending she is on Wellbutrin.    There is no immunization history on file for this patient. Pertinent  Health Maintenance Due  Topic Date Due  . COLONOSCOPY  06/03/1994  . DEXA SCAN  06/03/2009  . PNA vac Low Risk Adult (1 of 2 - PCV13) 06/03/2009  . INFLUENZA VACCINE  03/01/2019   No flowsheet data found. Functional Status Survey:    Vitals:   09/24/19 1045  BP: 122/77  Pulse: 87  Resp: 18  Temp: 97.7 F (36.5 C)  TempSrc: Oral  SpO2: 99%  Weight: 122 lb 3.2 oz (55.4 kg)  Height: 5' (1.524 m)   Body mass index is 23.87 kg/m. Physical Exam   In general this is a pleasant elderly female in no distress lying comfortably in bed.  Her skin is warm and dry surgical site left hip staples have been removed.  Eyes visual acuity appears to be intact sclera and  conjunctive are clear.  Oropharynx is clear mucous membranes moist.  Chest is clear to auscultation she does cough with deep inspiration but this is baseline with previous exam there is no labored breathing.  Heart is regular rate and rhythm with occasional irregular beat she does not really have significant edema.  Abdomen is soft nontender with positive bowel sounds.  Musculoskeletal Limited exam since she is in bed is able to move her upper extremities at baseline as well as lower extremities it appears with some baseline weakness.  Neurologic is grossly intact touch sensation appears to be intact lower extremities pedal pulses are intact bilaterally.  Psych she is alert and oriented pleasant and appropriate  Labs reviewed:  September 12, 2019.  WBC 4.2 hemoglobin 8.3 platelets 362.  Sodium 138 potassium 4.6 BUN 9.8 creatinine 0.46.  TSH 1.49.   Recent Labs    09/02/19 0000  NA 136*  K 4.9  CL 97*  CO2 32*  BUN 13  CALCIUM 8.4*   No results for input(s): AST, ALT, ALKPHOS, BILITOT, PROT, ALBUMIN in the last 8760 hours. Recent Labs    09/02/19 0000 09/08/19 0000  WBC 7.4 6.0  HGB 7.3* 7.9*  HCT 22* 24*  PLT 409* 476*   No results found for: TSH No results found for: HGBA1C No results found for: CHOL, HDL, LDLCALC, LDLDIRECT, TRIG, CHOLHDL  Significant Diagnostic Results in last 30 days:  No results found.  Assessment/Plan  #1 history of neuropathy-she is on Neurontin 300 mg 3 times daily-I did speak with her about this and will increase her Neurontin to 400 mg 3 times daily and monitor.  Neurologically she appears to be at baseline.  2.  History of insomnia as noted above she has been started on melatonin she thought she slept pretty well last night so will leave this alone for now.  She had spoken about Ambien but I did tell her we do have concerns with starting this especially if melatonin proves to be effective   #3 history of left hip fracture  status post repair she is nonweightbearing at this point she has been seen by orthopedics-at this point continue Lovenox 6 weeks postop.  4.  History of anemia she is on iron hemoglobin appears to be  gradually rising it was 8.3 on most recent lab.  5.  History of COPD she continues on Mucinex twice daily as well as an inhaler and albuterol nebs as needed this appears to be stable per her report.  [id number]

## 2019-09-25 LAB — BASIC METABOLIC PANEL
BUN: 9 (ref 4–21)
CO2: 32 — AB (ref 13–22)
Chloride: 97 — AB (ref 99–108)
Creatinine: 0.4 — AB (ref 0.5–1.1)
Glucose: 76
Potassium: 4.7 (ref 3.4–5.3)
Sodium: 137 (ref 137–147)

## 2019-09-25 LAB — COMPREHENSIVE METABOLIC PANEL
Calcium: 9 (ref 8.7–10.7)
GFR calc Af Amer: 90
GFR calc non Af Amer: 90

## 2019-09-25 LAB — CBC AND DIFFERENTIAL
HCT: 27 — AB (ref 36–46)
Hemoglobin: 8.8 — AB (ref 12.0–16.0)
Neutrophils Absolute: 3
Platelets: 290 (ref 150–399)
WBC: 4.6

## 2019-09-25 LAB — CBC: RBC: 2.95 — AB (ref 3.87–5.11)

## 2019-09-26 ENCOUNTER — Encounter: Payer: Self-pay | Admitting: Internal Medicine

## 2019-09-30 ENCOUNTER — Encounter: Payer: Self-pay | Admitting: Internal Medicine

## 2019-09-30 NOTE — Progress Notes (Deleted)
Location:  [facility] Room Number: 102-P Place of Service:  SNF [phone]) Provider:  Edmon Crape, PA-C  Patient Care Team: [name], MD as PCP - General (Internal Medicine)  Extended Emergency Contact Information Primary Emergency Contact: Kerby Moors Mobile Phone: [phone] Relation: Daughter Preferred language: English Interpreter needed? No  Code Status:  FULL CODE Goals of care: Advanced Directive information Advanced Directives 09/24/2019  Does Patient Have a Medical Advance Directive? Yes  Type of Advance Directive (No Data)  Does patient want to make changes to medical advance directive? No - Patient declined  Would patient like information on creating a medical advance directive? -     Chief Complaint  Patient presents with  . Medical Management of Chronic Issues    Routine [facility] visit    HPI:  Pt is a 74 y.o. female seen today for medical management of chronic diseases.     Past Medical History:  Diagnosis Date  . Anemia   . At risk for delirium   . Closed fracture of anterior column of left acetabulum (HCC)   . Closed fracture of pelvis (HCC)   . COPD (chronic obstructive pulmonary disease) (HCC)   . Depression   . HTN (hypertension)   . Hyponatremia   . Lupus (HCC)   . RLS (restless legs syndrome)    Past Surgical History:  Procedure Laterality Date  . ABDOMINAL HYSTERECTOMY    . CLOSED MANIPULATION AND CLOSED TREATMENT OF LEFT ACETABULUM FRACTURE, BOTH COLUMNS Left   . HIP SURGERY Left   . REMOVAL OF DEEP HARDWARE, LEFT INTRAMEDULLARY NAIL Left    TFN  . 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 Penicillins Diarrhea    Patient states she is allergic to 'all antibiotics' states has diarrhea and nausea.    Outpatient Encounter Medications as of 09/30/2019  Medication Sig  . acetaminophen (TYLENOL) 325 MG tablet Take 650 mg  by mouth every 6 (six) hours as needed for mild pain.  Marland Kitchen acetaminophen (TYLENOL) 500 MG tablet Take 1,000 mg by mouth every 6 (six) hours as needed (For Pain).  Marland Kitchen albuterol (PROVENTIL) (2.5 MG/3ML) 0.083% nebulizer solution Take 2.5 mg by nebulization every 8 (eight) hours as needed for wheezing or shortness of breath.  Marland Kitchen amLODipine (NORVASC) 5 MG tablet Take 5 mg by mouth daily.  Marland Kitchen aspirin EC 81 MG tablet Take 81 mg by mouth 2 (two) times daily. QAM and QHS  . bisacodyl (DULCOLAX) 10 MG suppository Place 10 mg rectally as needed for moderate constipation (Constopation not relieved by MOM).  . Budeson-Glycopyrrol-Formoterol (BREZTRI AEROSPHERE) 160-9-4.8 MCG/ACT AERO Inhale 1 puff into the lungs.  Marland Kitchen buPROPion (WELLBUTRIN XL) 300 MG 24 hr tablet Take 300 mg by mouth daily.  . calcium carbonate (TUMS - DOSED IN MG ELEMENTAL CALCIUM) 500 MG chewable tablet Chew 1 tablet by mouth daily. With meals  . calcium carbonate (TUMS - DOSED IN MG ELEMENTAL CALCIUM) 500 MG chewable tablet Chew 1 tablet by mouth 4 (four) times daily as needed.   . Carboxymethylcellulose Sodium (ARTIFICIAL TEARS OP) Instill 2 drops both eyes three times a day as needed for dry eyes  . Cholecalciferol (VITAMIN D-3) 125 MCG (5000 UT) TABS Take by mouth. Give 5,000 units by mouth daily  . dextromethorphan-guaiFENesin (MUCINEX DM) 30-600 MG 12hr tablet Take 1 tablet by mouth 2 (two) times daily.   Marland Kitchen  docusate sodium (COLACE) 100 MG capsule Take 100 mg by mouth daily.  Marland Kitchen enoxaparin (LOVENOX) 30 MG/0.3ML injection Inject 30 mg into the skin every 12 (twelve) hours.  . ferrous sulfate 325 (65 FE) MG tablet Take 325 mg by mouth daily with breakfast. For Anemia  . gabapentin (NEURONTIN) 300 MG capsule Take 400 mg by mouth 3 (three) times daily. FOR NEUROPATHY (DO NOT CRUSH)   . magnesium hydroxide (MILK OF MAGNESIA) 400 MG/5ML suspension Take 30 mLs by mouth daily as needed for mild constipation.  . Melatonin 3 MG TABS Take 3 mg by mouth at  bedtime.  . methocarbamol (ROBAXIN) 500 MG tablet Take 250 mg by mouth 2 (two) times daily as needed for muscle spasms.   . methocarbamol (ROBAXIN) 500 MG tablet TAKE 1/2 TABLET (250MG ) BY MOUTH TWICE DAILY AS NEEDED FOR MUSCLE SPASMS  . ondansetron (ZOFRAN) 4 MG tablet Take 4 mg by mouth every 6 (six) hours as needed for nausea or vomiting.  . pantoprazole (PROTONIX) 40 MG tablet Take 40 mg by mouth daily.  . Psyllium (METAMUCIL PO) Take by mouth. TAKE 1 SCOOP DAILY FOR CONSTIPATION- HOLD FOR DIARRHEA  . rOPINIRole (REQUIP) 0.25 MG tablet Take 0.5 mg by mouth in the morning. Take 2 tablets to = 0.5 mg  . rOPINIRole (REQUIP) 2 MG tablet Take 2 mg by mouth at bedtime.  . simethicone (MYLICON) 80 MG chewable tablet Chew 80 mg by mouth 3 (three) times daily as needed.   . Sodium Phosphates (RA SALINE ENEMA RE) Place 1 each rectally daily as needed (Constipation not relieved by bisacodyl).  . [DISCONTINUED] clonazePAM (KLONOPIN) 0.5 MG tablet Take 1 tablet (0.5 mg total) by mouth 2 (two) times daily as needed for anxiety.  . [DISCONTINUED] traZODone (DESYREL) 50 MG tablet Take 25 mg by mouth at bedtime.   No facility-administered encounter medications on file as of 09/30/2019.    Review of Systems   There is no immunization history on file for this patient. Pertinent  Health Maintenance Due  Topic Date Due  . COLONOSCOPY  06/03/1994  . DEXA SCAN  06/03/2009  . PNA vac Low Risk Adult (1 of 2 - PCV13) 06/03/2009  . INFLUENZA VACCINE  03/01/2019   No flowsheet data found. Functional Status Survey:    Vitals:   09/30/19 0911  BP: 106/60  Pulse: 91  Resp: 18  Temp: 97.7 F (36.5 C)  TempSrc: Oral  SpO2: 95%  Weight: 122 lb 3.2 oz (55.4 kg)  Height: 5' (1.524 m)   Body mass index is 23.87 kg/m. Physical Exam  Labs reviewed: Recent Labs    09/02/19 0000 09/12/19 0000  NA 136* 138  K 4.9 4.6  CL 97* 100  CO2 32* 31*  BUN 13 10  CREATININE  --  0.5  CALCIUM 8.4* 8.7   No  results for input(s): AST, ALT, ALKPHOS, BILITOT, PROT, ALBUMIN in the last 8760 hours. Recent Labs    09/02/19 0000 09/08/19 0000 09/12/19 0000  WBC 7.4 6.0 4.2  HGB 7.3* 7.9* 8.3*  HCT 22* 24* 3*  PLT 409* 476* 7*   Lab Results  Component Value Date   TSH 1.49 09/12/2019   No results found for: HGBA1C No results found for: CHOL, HDL, LDLCALC, LDLDIRECT, TRIG, CHOLHDL  Significant Diagnostic Results in last 30 days:  No results found.  Assessment/Plan There are no diagnoses linked to this encounter.   Family/ staff Communication: ***  Labs/tests ordered:  ***

## 2019-10-01 ENCOUNTER — Encounter: Payer: Self-pay | Admitting: Internal Medicine

## 2019-10-01 ENCOUNTER — Non-Acute Institutional Stay (SKILLED_NURSING_FACILITY): Payer: Medicare Other | Admitting: Internal Medicine

## 2019-10-01 DIAGNOSIS — D649 Anemia, unspecified: Secondary | ICD-10-CM | POA: Diagnosis not present

## 2019-10-01 DIAGNOSIS — G629 Polyneuropathy, unspecified: Secondary | ICD-10-CM

## 2019-10-01 DIAGNOSIS — S72142S Displaced intertrochanteric fracture of left femur, sequela: Secondary | ICD-10-CM

## 2019-10-01 DIAGNOSIS — J449 Chronic obstructive pulmonary disease, unspecified: Secondary | ICD-10-CM

## 2019-10-01 DIAGNOSIS — I10 Essential (primary) hypertension: Secondary | ICD-10-CM

## 2019-10-01 DIAGNOSIS — R531 Weakness: Secondary | ICD-10-CM

## 2019-10-01 MED ORDER — CLONAZEPAM 0.25 MG PO TBDP: 0.2500 mg | ORAL_TABLET | Freq: Two times a day (BID) | ORAL | 0 refills | Status: DC | PRN

## 2019-10-01 NOTE — Progress Notes (Signed)
This encounter was created in error - please disregard.

## 2019-10-01 NOTE — Progress Notes (Signed)
Location:    [name] Room Number: 102/P Place of Service:  SNF (31) Provider: Kathlen Brunswick, MD  Patient Care Team: [name], MD as PCP - General (Internal Medicine)  Extended Emergency Contact Information Primary Emergency Contact: Kerby Moors Mobile Phone: [phone] Relation: Daughter Preferred language: English Interpreter needed? No  Code Status:  Full Code Goals of care: Advanced Directive information Advanced Directives 10/01/2019  Does Patient Have a Medical Advance Directive? Yes  Type of Advance Directive (No Data)  Does patient want to make changes to medical advance directive? No - Patient declined  Would patient like information on creating a medical advance directive? -     Chief Complaint  Patient presents with  . Medical Management of Chronic Issues    Routine visit of medical management  Medical management of chronic medical conditions including history of left hip fracture with repair-anemia as well as COPD-insomnia hypertension depression GERD constipation restless legs neuropathy and anxiety  HPI:  Pt is a 74 y.o. female seen today for medical management of chronic diseases.  As noted above.  She is here for rehab after hospitalization for a fall that resulted in a left intertrochanteric hip fracture left acetabular fracture and a pelvic fracture.  This was complicated with a previous left hip fracture that was repaired in November of last year.  She underwent repair with removal of deep hardware  She continues on Lovenox for DVT prophylaxis until March 9.  Most recently she complained of increased numbness and tingling in her lower legs and hands and we did increase her Neurontin to 400 mg up from 300 mg 3 times daily and this appears to be helping.  She also has a history of depression but appears to be in better spirits recently we have ordered a psychiatric consult she continues on  Wellbutrin 300 mg daily.  Regards to other issues she does have a history of COPD she is on Mucinex twice a day as well as inhalers and nebulizers at this point appears to be stable-she says the Mucinex is helping.  In regards to hypertension she is on Norvasc 5 mg a day and this appears stable with recent systolics in the lower to mid 100s.  In regards to anxiety her orders for Klonopin as needed for 2 weeks has run out she feels she is still benefiting from this and has anxiety at times and would like it extended will restart it at a lower dose of 0.25 mg twice daily as needed and monitor.  Currently she is sitting in her chair comfortably she is reading a book does not have any acute complaints but says she does still feel somewhat weak.  Her pain at this point appears to be controlled.     Past Medical History:  Diagnosis Date  . Anemia   . At risk for delirium   . Closed fracture of anterior column of left acetabulum (HCC)   . Closed fracture of pelvis (HCC)   . COPD (chronic obstructive pulmonary disease) (HCC)   . Depression   . HTN (hypertension)   . Hyponatremia   . Lupus (HCC)   . RLS (restless legs syndrome)    Past Surgical History:  Procedure Laterality Date  . ABDOMINAL HYSTERECTOMY    . CLOSED MANIPULATION AND CLOSED TREATMENT OF LEFT ACETABULUM FRACTURE, BOTH COLUMNS Left   . HIP SURGERY Left   . REMOVAL OF DEEP HARDWARE, LEFT INTRAMEDULLARY NAIL Left  TFN  . 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 Penicillins Diarrhea    Patient states she is allergic to 'all antibiotics' states has diarrhea and nausea.    Allergies as of 10/01/2019      Reactions   Penicillins Diarrhea   Patient states she is allergic to 'all antibiotics' states has diarrhea and nausea.      Medication List       Accurate as of October 01, 2019  2:33 PM. If you have any questions, ask your nurse or doctor.         acetaminophen 325 MG tablet Commonly known as: TYLENOL Take 650 mg by mouth every 6 (six) hours as needed for mild pain.   acetaminophen 500 MG tablet Commonly known as: TYLENOL Take 1,000 mg by mouth every 6 (six) hours as needed (For Pain).   albuterol (2.5 MG/3ML) 0.083% nebulizer solution Commonly known as: PROVENTIL Take 2.5 mg by nebulization every 8 (eight) hours as needed for wheezing or shortness of breath.   amLODipine 5 MG tablet Commonly known as: NORVASC Take 5 mg by mouth daily.   ARTIFICIAL TEARS OP Instill 2 drops both eyes three times a day as needed for dry eyes   aspirin EC 81 MG tablet Take 81 mg by mouth 2 (two) times daily. QAM and QHS   bisacodyl 10 MG suppository Commonly known as: DULCOLAX Place 10 mg rectally as needed for moderate constipation (Constopation not relieved by MOM).   Breztri Aerosphere 160-9-4.8 MCG/ACT Aero Generic drug: Budeson-Glycopyrrol-Formoterol Inhale 1 puff into the lungs.   buPROPion 300 MG 24 hr tablet Commonly known as: WELLBUTRIN XL Take 300 mg by mouth daily.   calcium carbonate 500 MG chewable tablet Commonly known as: TUMS - dosed in mg elemental calcium Chew 1 tablet by mouth daily. With meals   calcium carbonate 500 MG chewable tablet Commonly known as: TUMS - dosed in mg elemental calcium Chew 1 tablet by mouth 4 (four) times daily as needed.   clonazePAM 0.25 MG disintegrating tablet Commonly known as: KLONOPIN Take 0.25 mg by mouth 2 (two) times daily. For Anxiety   dextromethorphan-guaiFENesin 30-600 MG 12hr tablet Commonly known as: MUCINEX DM Take 1 tablet by mouth 2 (two) times daily.   docusate sodium 100 MG capsule Commonly known as: COLACE Take 100 mg by mouth daily.   enoxaparin 30 MG/0.3ML injection Commonly known as: LOVENOX Inject 30 mg into the skin every 12 (twelve) hours.   ferrous sulfate 325 (65 FE) MG tablet Take 325 mg by mouth daily with breakfast. For Anemia    gabapentin 300 MG capsule Commonly known as: NEURONTIN Take 400 mg by mouth 3 (three) times daily. FOR NEUROPATHY (DO NOT CRUSH)   magnesium hydroxide 400 MG/5ML suspension Commonly known as: MILK OF MAGNESIA Take 30 mLs by mouth daily as needed for mild constipation.   Melatonin 3 MG Tabs Take 3 mg by mouth at bedtime.   METAMUCIL PO Take by mouth. TAKE 1 SCOOP DAILY FOR CONSTIPATION- HOLD FOR DIARRHEA   methocarbamol 500 MG tablet Commonly known as: ROBAXIN Take 500 mg by mouth 2 (two) times daily as needed for muscle spasms.   methocarbamol 500 MG tablet Commonly known as: ROBAXIN TAKE 1/2 TABLET (250MG ) BY MOUTH TWICE DAILY AS NEEDED FOR MUSCLE SPASMS   ondansetron 4 MG tablet Commonly known as: ZOFRAN Take 4 mg by mouth every 6 (six) hours  as needed for nausea or vomiting.   pantoprazole 40 MG tablet Commonly known as: PROTONIX Take 40 mg by mouth daily.   RA SALINE ENEMA RE Place 1 each rectally daily as needed (Constipation not relieved by bisacodyl).   rOPINIRole 2 MG tablet Commonly known as: REQUIP Take 2 mg by mouth at bedtime.   rOPINIRole 0.25 MG tablet Commonly known as: REQUIP Take 0.5 mg by mouth in the morning. Take 2 tablets to = 0.5 mg   simethicone 80 MG chewable tablet Commonly known as: MYLICON Chew 80 mg by mouth 3 (three) times daily as needed.   Vitamin D-3 125 MCG (5000 UT) Tabs Take by mouth. Give 5,000 units by mouth daily       Review of Systems Any we will need   General she is not complaining of any fever or chills.  Skin does not complain of rashes or itching surgical site left hip appears to be healing well.  Head ears eyes nose mouth and throat is not complaining of visual changes or sore throat.  Respiratory does not complain of being short of breath or having an increased cough from baseline.  Cardiac does not complain of chest pain or increased edema.  GI is not complaining of abdominal discomfort nausea vomiting  diarrhea-has complained of constipation in the past but has been started on Metamucil and she is on Colace her last bowel movement was 1 to 2 days ago.  GU is not complaining of dysuria.  Musculoskeletal status post hip fracture with repair but does not complain of pain at this point.  Neurologic does not complain of dizziness headache does have some weakness and has complained of numbness and her Neurontin has recently been increased  Psych does not complain of overt depression but previously did have some frustration-psych consult is pending at this point appears to be stable she is on Wellbutrin.      There is no immunization history on file for this patient. Pertinent  Health Maintenance Due  Topic Date Due  . COLONOSCOPY  06/03/1994  . DEXA SCAN  06/03/2009  . PNA vac Low Risk Adult (1 of 2 - PCV13) 06/03/2009  . INFLUENZA VACCINE  03/01/2019   No flowsheet data found. Functional Status Survey:    Vitals:   10/01/19 1358  BP: 133/69  Pulse: 84  Resp: 18  Temp: 98 F (36.7 C)  TempSrc: Oral  SpO2: 95%  Weight: 122 lb 3.2 oz (55.4 kg)  Height: 5' (1.524 m)   Body mass index is 23.87 kg/m. Physical Exam  In general this is a well-nourished elderly female in no distress sitting comfortably in her recliner.  Her skin is warm and dry surgical site left hip appears to be healing well without signs of infection  Eyes visual acuity appears to be intact sclera and conjunctive are clear.  She does have prescription lenses.  Oropharynx is clear mucous membranes moist.  Chest is clear to auscultation with somewhat shallow air entry she does cough with deep inspiration this has been baseline on previous exams.  Heart is regular rate and rhythm without murmur gallop or rub she does not appear to have significant lower extremity edema  Abdomen is soft nontender with positive bowel sounds.  Musculoskeletal is able move all extremities x4 surgical site appears unremarkable  with well-healing surgical scar.  Neurologic appears grossly intact her speech is clear touch sensation is intact bilaterally lower extremities.  Psych she is alert and oriented pleasant and  appropriate  Labs reviewed: Recent Labs    09/02/19 0000 09/12/19 0000 09/25/19 0000  NA 136* 138 137  K 4.9 4.6 4.7  CL 97* 100 97*  CO2 32* 31* 32*  BUN 13 10 9   CREATININE  --  0.5 0.4*  CALCIUM 8.4* 8.7 9.0   No results for input(s): AST, ALT, ALKPHOS, BILITOT, PROT, ALBUMIN in the last 8760 hours. Recent Labs    09/08/19 0000 09/12/19 0000 09/25/19 0000  WBC 6.0 4.2 4.6  NEUTROABS  --   --  3  HGB 7.9* 8.3* 8.8*  HCT 24* 3* 27*  PLT 476* 7* 290   Lab Results  Component Value Date   TSH 1.49 09/12/2019   No results found for: HGBA1C No results found for: CHOL, HDL, LDLCALC, LDLDIRECT, TRIG, CHOLHDL  Significant Diagnostic Results in last 30 days:  No results found.  Assessment/Plan    1.  - 1.-History ofLeft intertrochanteric fracture/left acetabular fracture-status post repair of left peritrochanteric nonunion with use of left leg plate, removal of deep hardware left intramedullary nail, closed manipulation and closed treatment of left acetabular fracture --She continues on Lovenox through March 9 for DVT prophylaxis At this point pain appears to be controlled she does have orders for Tylenol as needed. She also has orders for Robaxin 500 mg twice a day  2.  History of neuropathy her Neurontin has been increased to 400 mg 3 times daily at this point appears to be stable she does not really complain of neuropathic pain today.  3.  History of anemia hemoglobin he is trending up it is now 8.8 we will have this updated next week.  She is on iron 325 mg a day.  4.  History of COPD at this point appears to be compensated she is on Mucinex twice a day as well as orders for albuterol nebs as needed as needed and continues on inhalers as well.  5.  History of hypertension  this appears stable on Norvasc 5 mg a day recent blood pressures 108/64-125/76.  6.-History of anxiety she has been on Klonopin 0.5 twice daily as needed will reorder this for short-term at 0.25 mg twice daily as needed this was discussed with patient and she is satisfied with this.  7.  History of insomnia she continues on melatonin 3 mg a day-she had complained of feeling somewhat sedated during the day when she was on the trazodone.  8.  History of depression at this point does not appear overtly depressed-psychiatric consult has been ordered she is on Wellbutrin 300 mg daily appears to be doing better in this regards.  9.  History of GERD she continues on Protonix 40 mg daily this appears to be stable.  .  10 history of restless legs she continues on Requip she gets this 0.5 mg in the morning and 2 mg nightly-this appears to be stable previous pain complaints appear to be more neuropathic in origin and again her Neurontin was increased  #11-history of constipation Metamucil has recently been added to her Colace apparently this has improved some although she does not have a BM every day-- at this point will monitor abdominal exam was benign she is not really complaining of pain or discomfort in this regards at this time. She also has been started on simethicone for previous complaints of gas pain  12.  History of weakness-status post surgery again will update blood work including a CBC and comprehensive metabolic panel.  She does not report  problems working with therapy and apparently is doing relatively well.  I suspect her weakness could be postop-multifactorial with history of anemia as well as some debility but will see what the lab work tells [place] and monitor.  Of note recent TSH was within normal limits at 1.49  [id number]-of note greater than 35 minutes spent assessing patient-reviewing her chart and labs-discussing her concerns at bedside-and coordinating and formulating a plan of care  for numerous diagnoses-of note greater than 50% of time spent coordinating a plan of care with input as noted above

## 2019-10-03 ENCOUNTER — Other Ambulatory Visit: Payer: Self-pay | Admitting: Internal Medicine

## 2019-10-03 MED ORDER — CLONAZEPAM 0.5 MG PO TABS: 0.2500 mg | ORAL_TABLET | Freq: Two times a day (BID) | ORAL | 0 refills | Status: DC | PRN

## 2019-10-05 ENCOUNTER — Encounter: Payer: Self-pay | Admitting: Internal Medicine

## 2019-10-07 ENCOUNTER — Non-Acute Institutional Stay (SKILLED_NURSING_FACILITY): Payer: Medicare Other | Admitting: Internal Medicine

## 2019-10-07 ENCOUNTER — Encounter: Payer: Self-pay | Admitting: Internal Medicine

## 2019-10-07 DIAGNOSIS — J441 Chronic obstructive pulmonary disease with (acute) exacerbation: Secondary | ICD-10-CM | POA: Diagnosis not present

## 2019-10-07 NOTE — Progress Notes (Signed)
Location:  [facility] Room Number: 102-P Place of Service:  SNF ([phone])  [name], MD  Patient Care Team: [name], MD as PCP - General (Internal Medicine)  Extended Emergency Contact Information Primary Emergency Contact: Kerby Moors Mobile Phone: [phone] Relation: Daughter Preferred language: English Interpreter needed? No    Allergies: Penicillins  Chief Complaint  Patient presents with  . Acute Visit    Patient is seen secondary to cough and congestion.     HPI: Patient is a 74 y.o. female who is being seen for reported cough.  Patient is coughing as I am entering the room and it is a dry hacking cough.  Patient denies sputum production.  Patient admits this feels like a COPD exacerbation and I agree with her.  Patient has had no fever.  Patient does admit that she improves after albuterol nebs.  Patient admits that she does have chest pain but it is not because of the coughing, as because of the strap is being used during physical therapy to help her stand and transfer.    Past Medical History:  Diagnosis Date  . Anemia   . At risk for delirium   . Closed fracture of anterior column of left acetabulum (HCC)   . Closed fracture of pelvis (HCC)   . COPD (chronic obstructive pulmonary disease) (HCC)   . Depression   . HTN (hypertension)   . Hyponatremia   . Lupus (HCC)   . RLS (restless legs syndrome)     Past Surgical History:  Procedure Laterality Date  . ABDOMINAL HYSTERECTOMY    . CLOSED MANIPULATION AND CLOSED TREATMENT OF LEFT ACETABULUM FRACTURE, BOTH COLUMNS Left   . HIP SURGERY Left   . REMOVAL OF DEEP HARDWARE, LEFT INTRAMEDULLARY NAIL Left    TFN  . 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      Medication  List       Accurate as of October 07, 2019 12:07 PM. If you have any questions, ask your nurse or doctor.        acetaminophen 325 MG tablet Commonly known as: TYLENOL Take 650 mg by mouth every 6 (six) hours as needed for mild pain.   acetaminophen 500 MG tablet Commonly known as: TYLENOL Take 1,000 mg by mouth every 6 (six) hours as needed (For Pain).   albuterol (2.5 MG/3ML) 0.083% nebulizer solution Commonly known as: PROVENTIL Take 2.5 mg by nebulization every 8 (eight) hours as needed for wheezing or shortness of breath.   amLODipine 5 MG tablet Commonly known as: NORVASC Take 5 mg by mouth daily.   ARTIFICIAL TEARS OP Instill 2 drops both eyes three times a day as needed for dry eyes   aspirin EC 81 MG tablet Take 81 mg by mouth 2 (two) times daily. QAM and QHS   bisacodyl 10 MG suppository Commonly known as: DULCOLAX Place 10 mg rectally as needed for moderate constipation (Constopation not relieved by MOM).   Breztri Aerosphere 160-9-4.8 MCG/ACT Aero Generic drug: Budeson-Glycopyrrol-Formoterol Inhale 1 puff into the lungs.   buPROPion 300 MG 24 hr tablet Commonly known as: WELLBUTRIN XL Take 300 mg by mouth daily.   calcium carbonate 500 MG chewable tablet Commonly known as: TUMS - dosed in  mg elemental calcium Chew 1 tablet by mouth daily. With meals   calcium carbonate 500 MG chewable tablet Commonly known as: TUMS - dosed in mg elemental calcium Chew 1 tablet by mouth 4 (four) times daily as needed.   clonazePAM 0.5 MG tablet Commonly known as: KlonoPIN Take 0.5 tablets (0.25 mg total) by mouth 2 (two) times daily as needed for up to 11 days for anxiety.   dextromethorphan-guaiFENesin 30-600 MG 12hr tablet Commonly known as: MUCINEX DM Take 1 tablet by mouth 2 (two) times daily.   docusate sodium 100 MG capsule Commonly known as: COLACE Take 100 mg by mouth daily.   enoxaparin 30 MG/0.3ML injection Commonly known as: LOVENOX Inject 30 mg into  the skin every 12 (twelve) hours.   ferrous sulfate 325 (65 FE) MG tablet Take 325 mg by mouth daily with breakfast. For Anemia   gabapentin 400 MG capsule Commonly known as: NEURONTIN Take 400 mg by mouth 3 (three) times daily. What changed: Another medication with the same name was removed. Continue taking this medication, and follow the directions you see here. Changed by: Merrilee Seashore, MD   magnesium hydroxide 400 MG/5ML suspension Commonly known as: MILK OF MAGNESIA Take 30 mLs by mouth daily as needed for mild constipation.   Melatonin 3 MG Tabs Take 3 mg by mouth at bedtime.   METAMUCIL PO Take by mouth. TAKE 1 SCOOP DAILY FOR CONSTIPATION- HOLD FOR DIARRHEA   methocarbamol 500 MG tablet Commonly known as: ROBAXIN Take 500 mg by mouth 2 (two) times daily as needed for muscle spasms.   methocarbamol 500 MG tablet Commonly known as: ROBAXIN TAKE 1/2 TABLET (250MG ) BY MOUTH TWICE DAILY AS NEEDED FOR MUSCLE SPASMS   ondansetron 4 MG tablet Commonly known as: ZOFRAN Take 4 mg by mouth every 6 (six) hours as needed for nausea or vomiting.   pantoprazole 40 MG tablet Commonly known as: PROTONIX Take 40 mg by mouth daily.   RA SALINE ENEMA RE Place 1 each rectally daily as needed (Constipation not relieved by bisacodyl).   rOPINIRole 2 MG tablet Commonly known as: REQUIP Take 2 mg by mouth at bedtime.   rOPINIRole 0.25 MG tablet Commonly known as: REQUIP Take 0.5 mg by mouth in the morning. Take 2 tablets to = 0.5 mg   simethicone 80 MG chewable tablet Commonly known as: MYLICON Chew 80 mg by mouth 3 (three) times daily as needed.   Vitamin D-3 125 MCG (5000 UT) Tabs Take by mouth. Give 5,000 units by mouth daily       No orders of the defined types were placed in this encounter.    There is no immunization history on file for this patient.  Social History   Tobacco Use  . Smoking status: Former  . Smokeless tobacco: Never Used  Substance  Use Topics  . Alcohol use: Not on file    Review of Systems  GENERAL:  no fevers, fatigue, appetite changes SKIN: No itching, rash HEENT: No complaint RESPIRATORY: + cough, some wheezing, SOB CARDIAC: No chest pain, palpitations, lower extremity edema  GI: No abdominal pain, No N/V/D or constipation, No heartburn or reflux  GU: No dysuria, frequency or urgency, or incontinence  MUSCULOSKELETAL: No unrelieved bone/joint pain NEUROLOGIC: No headache, dizziness  PSYCHIATRIC: No overt anxiety or sadness  Vitals:   10/07/19 1131  BP: 110/68  Pulse: 86  Resp: 17  Temp: 97.9 F (36.6 C)  SpO2: 95%   Body mass index is  23.87 kg/m. Physical Exam  GENERAL APPEARANCE: Alert, conversant, No acute distress  SKIN: No diaphoresis rash HEENT: Unremarkable RESPIRATORY: Breathing is even, unlabored. Lung sounds are decreased diffusely no wheezing CARDIOVASCULAR: Heart RRR no murmurs, rubs or gallops. No peripheral edema  GASTROINTESTINAL: Abdomen is soft, non-tender, not distended w/ normal bowel sounds.  GENITOURINARY: Bladder non tender, not distended  MUSCULOSKELETAL: No abnormal joints or musculature NEUROLOGIC: Cranial nerves 2-12 grossly intact. Moves all extremities PSYCHIATRIC: Mood and affect appropriate to situation, no behavioral issues  Patient Active Problem List   Diagnosis Date Noted  . Intertrochanteric fracture of left femur ([place]) 09/05/2019  . Fracture of acetabulum, left, closed ([place]) 09/05/2019  . Hypertension 09/05/2019  . COPD (chronic obstructive pulmonary disease) ([place]) 09/05/2019  . Depression 09/05/2019  . GERD (gastroesophageal reflux disease) 09/05/2019  . Restless leg syndrome 09/05/2019    CMP     Component Value Date/Time   NA 137 09/25/2019 0000   K 4.7 09/25/2019 0000   CL 97 (A) 09/25/2019 0000   CO2 32 (A) 09/25/2019 0000   BUN 9 09/25/2019 0000   CREATININE 0.4 (A) 09/25/2019 0000   CALCIUM 9.0 09/25/2019 0000   GFRNONAA 90 09/25/2019  0000   GFRAA 90 09/25/2019 0000   Recent Labs    09/02/19 0000 09/12/19 0000 09/25/19 0000  NA 136* 138 137  K 4.9 4.6 4.7  CL 97* 100 97*  CO2 32* 31* 32*  BUN 13 10 9   CREATININE  --  0.5 0.4*  CALCIUM 8.4* 8.7 9.0   No results for input(s): AST, ALT, ALKPHOS, BILITOT, PROT, ALBUMIN in the last 8760 hours. Recent Labs    09/08/19 0000 09/12/19 0000 09/25/19 0000  WBC 6.0 4.2 4.6  NEUTROABS  --   --  3  HGB 7.9* 8.3* 8.8*  HCT 24* 3* 27*  PLT 476* 7* 290   No results for input(s): CHOL, LDLCALC, TRIG in the last 8760 hours.  Invalid input(s): HCL No results found for: MICROALBUR Lab Results  Component Value Date   TSH 1.49 09/12/2019   No results found for: HGBA1C No results found for: CHOL, HDL, LDLCALC, LDLDIRECT, TRIG, CHOLHDL  Significant Diagnostic Results in last 30 days:  No results found.  Assessment and Plan  Acute COPD exacerbation-his usual combination inhaler, Robitussin-DM 600 mg tablets 2 p.o. twice daily, and add duo nebs 4 times daily for 1 week.  Patient's chest x-ray is negative; will monitor response.    Hennie Duos, MD

## 2019-10-08 ENCOUNTER — Encounter: Payer: Self-pay | Admitting: Internal Medicine

## 2019-10-08 ENCOUNTER — Non-Acute Institutional Stay (SKILLED_NURSING_FACILITY): Payer: Medicare Other | Admitting: Internal Medicine

## 2019-10-08 DIAGNOSIS — S72142S Displaced intertrochanteric fracture of left femur, sequela: Secondary | ICD-10-CM | POA: Diagnosis not present

## 2019-10-08 DIAGNOSIS — D509 Iron deficiency anemia, unspecified: Secondary | ICD-10-CM | POA: Diagnosis not present

## 2019-10-08 DIAGNOSIS — I10 Essential (primary) hypertension: Secondary | ICD-10-CM | POA: Diagnosis not present

## 2019-10-08 DIAGNOSIS — F329 Major depressive disorder, single episode, unspecified: Secondary | ICD-10-CM

## 2019-10-08 DIAGNOSIS — G2581 Restless legs syndrome: Secondary | ICD-10-CM

## 2019-10-08 DIAGNOSIS — J449 Chronic obstructive pulmonary disease, unspecified: Secondary | ICD-10-CM | POA: Diagnosis not present

## 2019-10-08 DIAGNOSIS — F32A Depression, unspecified: Secondary | ICD-10-CM

## 2019-10-08 NOTE — Progress Notes (Signed)
Location:    [facility] Room Number: 102/P Place of Service:  SNF ([id number])  Provider: Estill BattenLassen,Estoria Geary, PA-C  PCP: Oval Linseyondiego, Richard, MD Patient Care Team: Oval Linseyondiego, Richard, MD as PCP - General (Internal Medicine)  Extended Emergency Contact Information Primary Emergency Contact: Kerby MoorsWirst, Jill Mobile Phone: [phone] Relation: Daughter Preferred language: English Interpreter needed? No  Code Status: Full Code Goals of care:  Advanced Directive information Advanced Directives 10/08/2019  Does Patient Have a Medical Advance Directive? Yes  Type of Advance Directive (No Data)  Does patient want to make changes to medical advance directive? No - Patient declined  Would patient like information on creating a medical advance directive? -     Allergies  Allergen Reactions  . Penicillins Diarrhea    Patient states she is allergic to 'all antibiotics' states has diarrhea and nausea.    Chief Complaint  Patient presents with  . Discharge Note    Discharge Visit    HPI:  74 y.o. female seen today for discharge from facility slated for later this week.  She has been here for rehab after hospitalization for a fall that resulted in a left intertrochanteric hip fracture as well as left acetabular fracture as well as a pelvic fracture.  This was complicated with a previous left hip fracture that was repaired back in November.  She underwent a repair with removal of the deep hardware-she has just completed a course of Lovenox for DVT prophylaxis.  And is now on aspirin twice daily.  Her other diagnoses includeCOPD-anemia-insomnia as well as hypertension depression GERD constipation neuropathy and restless legs and anxiety  She has been started on prednisone for what was thought to be a COPD flare-she is also on Mucinex twice daily and Breztri inhaler  She also has orders for DuoNeb's 4 times a day for 7 days  She does have somewhat of a chronic cough-but  does not complain of difficulty breathing and this appears to be relatively stabilized.  She also has a history of postop anemia she is on iron and hemoglobin appears to be rising was 8.7 on lab done on March 4.  Regards to hypertension she is on Norvasc 5 mg a day this appears stable recent blood pressures 119/74 110/68-142/66.  She also has a history of anxiety and we have extended her Klonopin for a couple weeks she says she is benefiting from this we did reduce the dose down to 0.25 mg twice daily as needed.  She also has a history of restless legs and continues on Requip twice a day 0.5 mg in a.m. and 2 mg at night.  As well as neuropathy for which she continues on Neurontin  Currently she is sitting in her chair comfortably-she has appealed her discharge-but if she goes home she will need continued PT and OT as well as a wheelchair to assist with ambulation.      Past Medical History:  Diagnosis Date  . Anemia   . At risk for delirium   . Closed fracture of anterior column of left acetabulum (HCC)   . Closed fracture of pelvis (HCC)   . COPD (chronic obstructive pulmonary disease) (HCC)   . Depression   . HTN (hypertension)   . Hyponatremia   . Lupus (HCC)   . RLS (restless legs syndrome)     Past Surgical History:  Procedure Laterality Date  . ABDOMINAL HYSTERECTOMY    . CLOSED MANIPULATION AND CLOSED TREATMENT OF LEFT ACETABULUM FRACTURE, BOTH  COLUMNS Left   . HIP SURGERY Left   . REMOVAL OF DEEP HARDWARE, LEFT INTRAMEDULLARY NAIL Left    TFN  . 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 She has never used smokeless tobacco. No history on file for alcohol and drug. Social History   Socioeconomic History  . Marital status: Widowed    Spouse name: Not on file  . Number of children: Not on file  . Years of education: Not on file  . Highest education level: Not on file  Occupational  History  . Not on file  Tobacco Use  . Smoking status: Former Games developer  . Smokeless tobacco: Never Used  Substance and Sexual Activity  . Alcohol use: Not on file  . Drug use: Not on file  . Sexual activity: Not on file  Other Topics Concern  . Not on file  Social History Narrative  . Not on file   Social Determinants of Health   Financial Resource Strain:   . Difficulty of Paying Living Expenses: Not on file  Food Insecurity:   . Worried About Programme researcher, broadcasting/film/video in the Last Year: Not on file  . Ran Out of Food in the Last Year: Not on file  Transportation Needs:   . Lack of Transportation (Medical): Not on file  . Lack of Transportation (Non-Medical): Not on file  Physical Activity:   . Days of Exercise per Week: Not on file  . Minutes of Exercise per Session: Not on file  Stress:   . Feeling of Stress : Not on file  Social Connections:   . Frequency of Communication with Friends and Family: Not on file  . Frequency of Social Gatherings with Friends and Family: Not on file  . Attends Religious Services: Not on file  . Active Member of Clubs or Organizations: Not on file  . Attends Banker Meetings: Not on file  . Marital Status: Not on file  Intimate Partner Violence:   . Fear of Current or Ex-Partner: Not on file  . Emotionally Abused: Not on file  . Physically Abused: Not on file  . Sexually Abused: Not on file   Functional Status Survey:    Allergies  Allergen Reactions  . Penicillins Diarrhea    Patient states she is allergic to 'all antibiotics' states has diarrhea and nausea.    Pertinent  Health Maintenance Due  Topic Date Due  . COLONOSCOPY  06/03/1994  . DEXA SCAN  06/03/2009  . PNA vac Low Risk Adult (1 of 2 - PCV13) 06/03/2009  . INFLUENZA VACCINE  Completed    Medications: Allergies as of 10/08/2019      Reactions   Penicillins Diarrhea   Patient states she is allergic to 'all antibiotics' states has diarrhea and nausea.        Medication List       Accurate as of October 08, 2019  4:03 PM. If you have any questions, ask your nurse or doctor.        acetaminophen 325 MG tablet Commonly known as: TYLENOL Take 325 mg by mouth every 6 (six) hours as needed for mild pain.   acetaminophen 500 MG tablet Commonly known as: TYLENOL Take 1,000 mg by mouth every 6 (six) hours as needed (For Pain).   albuterol (2.5 MG/3ML) 0.083% nebulizer solution Commonly known as: PROVENTIL Take 2.5 mg by nebulization every 8 (eight)  hours as needed for wheezing or shortness of breath.   amLODipine 5 MG tablet Commonly known as: NORVASC Take 5 mg by mouth daily.   ARTIFICIAL TEARS OP Instill 2 drops both eyes three times a day as needed for dry eyes   aspirin EC 81 MG tablet Take 81 mg by mouth 2 (two) times daily. QAM and QHS   bisacodyl 10 MG suppository Commonly known as: DULCOLAX Place 10 mg rectally as needed for moderate constipation (Constopation not relieved by MOM).   Breztri Aerosphere 160-9-4.8 MCG/ACT Aero Generic drug: Budeson-Glycopyrrol-Formoterol Inhale 1 puff into the lungs.   buPROPion 300 MG 24 hr tablet Commonly known as: WELLBUTRIN XL Take 300 mg by mouth daily.   calcium carbonate 500 MG chewable tablet Commonly known as: TUMS - dosed in mg elemental calcium Chew 1 tablet by mouth daily. With meals   calcium carbonate 500 MG chewable tablet Commonly known as: TUMS - dosed in mg elemental calcium Chew 1 tablet by mouth 4 (four) times daily as needed.   clonazePAM 0.5 MG tablet Commonly known as: KlonoPIN Take 0.5 tablets (0.25 mg total) by mouth 2 (two) times daily as needed for up to 11 days for anxiety.   dextromethorphan-guaiFENesin 30-600 MG 12hr tablet Commonly known as: MUCINEX DM Take 1 tablet by mouth 2 (two) times daily.   docusate sodium 100 MG capsule Commonly known as: COLACE Take 100 mg by mouth daily.   enoxaparin 30 MG/0.3ML injection Commonly known as:  LOVENOX Inject 30 mg into the skin every 12 (twelve) hours.   ferrous sulfate 325 (65 FE) MG tablet Take 325 mg by mouth daily with breakfast. For Anemia   gabapentin 400 MG capsule Commonly known as: NEURONTIN Take 400 mg by mouth 3 (three) times daily.   ipratropium-albuterol 0.5-2.5 (3) MG/3ML Soln Commonly known as: DUONEB Take 3 mLs by nebulization. INHALE 1 VIAL VIA NEBULIZER 4 TIMES A DAY FOR 7 DAYS FOR ACUTE COPD EXACERBATION   magnesium hydroxide 400 MG/5ML suspension Commonly known as: MILK OF MAGNESIA Take 30 mLs by mouth daily as needed for mild constipation.   Melatonin 3 MG Tabs Take 3 mg by mouth at bedtime.   METAMUCIL PO Take by mouth. TAKE 1 SCOOP DAILY FOR CONSTIPATION- HOLD FOR DIARRHEA   methocarbamol 500 MG tablet Commonly known as: ROBAXIN Take 500 mg by mouth 2 (two) times daily as needed for muscle spasms.   methocarbamol 500 MG tablet Commonly known as: ROBAXIN TAKE 1/2 TABLET (250MG ) BY MOUTH TWICE DAILY AS NEEDED FOR MUSCLE SPASMS   ondansetron 4 MG tablet Commonly known as: ZOFRAN Take 4 mg by mouth every 6 (six) hours as needed for nausea or vomiting.   pantoprazole 40 MG tablet Commonly known as: PROTONIX Take 40 mg by mouth daily.   predniSONE 20 MG tablet Commonly known as: DELTASONE Give 40 mg ( 2 tablet) day 2 from 10/09/2019-10/09/2019, then Give one tablet a day for 3 days from 10/10/2019-10/12/2019   RA SALINE ENEMA RE Place 1 each rectally daily as needed (Constipation not relieved by bisacodyl).   rOPINIRole 2 MG tablet Commonly known as: REQUIP Take 2 mg by mouth at bedtime.   rOPINIRole 0.25 MG tablet Commonly known as: REQUIP Take 0.5 mg by mouth in the morning. Take 2 tablets to = 0.5 mg   simethicone 80 MG chewable tablet Commonly known as: MYLICON Chew 80 mg by mouth 3 (three) times daily as needed.   Vitamin D-3 125 MCG (5000 UT) Tabs  Take by mouth. Give 5,000 units by mouth daily       Review of Systems    In general she is not complaining of any fever or chills.  Skin does not complain of rashes or itching surgical site appears well-healed.  Head ears eyes nose mouth and throat is not complain of visual changes or sore throat.  Respiratory is not complaining of shortness of breath she does have orders for oxygen she does have somewhat of a chronic cough-she is being treated for possible COPD flare.  Cardiac is not complaining of chest pain or increasing edema.  GI is not complaining of abdominal discomfort nausea vomiting diarrhea at times has somewhat irregular bowel movements.  GU is not complaining of dysuria.  Musculoskeletal is status post left hip repair at this point is not complaining of pain.  Neurologic does not complain of dizziness headache or syncope does have some history of neuropathy.  And psych does have some element of anxiety at times she does have Klonopin low dose if needed short-term.    Vitals:   10/08/19 1548  BP: 138/72  Pulse: 94  Resp: 18  Temp: (!) 97.2 F (36.2 C)  TempSrc: Oral  SpO2: 98%  Weight: 122 lb 3.2 oz (55.4 kg)  Height: 5' (1.524 m)   Body mass index is 23.87 kg/m. Physical Exam In general this is a pleasant elderly female in no distress.  Her skin is warm and dry  Eyes visual acuity appears to be intact-sclera and conjunctive are clear.  Oropharynx is clear mucous membranes moist.  Chest is clear to auscultation with somewhat shallow air entry she does cough with deep inspiration.  There is no labored breathing.  Heart is regular rate and rhythm without murmur gallop or rub she does not have significant lower extremity edema.  Abdomen is soft nontender with positive bowel sounds.  Musculoskeletal is able to move all extremities x4 it appears at relative baseline surgical site left hip appears benign and well-healed She does not have significant lower extremity edema.  Neurologic is grossly intact her speech is clear  cannot appreciate lateralizing findings.  Psych she is alert and oriented pleasant and appropriate Labs reviewed:  October 02, 2019.  Sodium 134 potassium 4.3 BUN 13.2 creatinine 0.5.  Liver function tests within normal limits.  WBC 5.9 hemoglobin 8.7 platelets 364 Basic Metabolic Panel: Recent Labs    09/02/19 0000 09/12/19 0000 09/25/19 0000  NA 136* 138 137  K 4.9 4.6 4.7  CL 97* 100 97*  CO2 32* 31* 32*  BUN 13 10 9   CREATININE  --  0.5 0.4*  CALCIUM 8.4* 8.7 9.0   Liver Function Tests: No results for input(s): AST, ALT, ALKPHOS, BILITOT, PROT, ALBUMIN in the last 8760 hours. No results for input(s): LIPASE, AMYLASE in the last 8760 hours. No results for input(s): AMMONIA in the last 8760 hours. CBC: Recent Labs    09/08/19 0000 09/12/19 0000 09/25/19 0000  WBC 6.0 4.2 4.6  NEUTROABS  --   --  3  HGB 7.9* 8.3* 8.8*  HCT 24* 3* 27*  PLT 476* 7* 290   Cardiac Enzymes: No results for input(s): CKTOTAL, CKMB, CKMBINDEX, TROPONINI in the last 8760 hours. BNP: Invalid input(s): POCBNP CBG: No results for input(s): GLUCAP in the last 8760 hours.  Procedures and Imaging Studies During Stay: No results found.  Assessment/Plan:    #1 .-History ofLeft intertrochanteric fracture/left acetabular fracture-status post repair of left peritrochanteric nonunion with use  of left leg plate, removal of deep hardware left intramedullary nail, closed manipulation and closed treatment of left acetabular fracture This appears stable she will need orthopedic follow-up-she does have orders for Tylenol as needed and Robaxin 500 mg twice a day-she is not really complaining of pain-she will need continued PT and OT when she goes home as well as a wheelchair to assist with her ambulation.  She is now on aspirin 81 mg twice daily for DVT prophylaxis  2.  History of COPD-she is on a prednisone taper which will last for 3 days-she also has orders for Mucinex twice daily routine as well as  duo nebs 4 times daily for 7 days-she also has orders for the [facility] inhaler daily. At this point appears stable respiratory wise  #3 hypertension this appears stable on Norvasc 5 mg a day.  4.  History of anemia postop hemoglobin appears to be rising she is on iron but apparently has somewhat spotty intake of this secondary to GI concerns.  Will update a CBC.  5.  History of anxiety she is on a short course of Klonopin 0.25 mg twice daily as needed at this point appears stable.  6.  History of insomnia she is on melatonin 3 mg nightly she does not complain of insomnia today.  7.  History of restless leg syndrome she is on Requip 0.5 mg every morning and 2 mg nightly.  8.  History of neuropathy we did increase her Neurontin to 400 mg 3 times daily at this point she is not really complain of neuropathic discomfort.  9.  History of constipation she has been started on Metamucil per her request she also is on Colace once a day she says she has a bowel movement about every other day.  She also is on simethicone 3 times daily as needed for gas discomfort.  10.  Depression she is on Wellbutrin 300 mg a day-at one point staff thought she had depression she appears to be in better spirits we did order psych consult although since she is about to go home I suspect this will need to be follow-up as an outpatient but clinically she appears to be doing well   [id number]-of note greater than 40 minutes spent on this discharge summary-greater than 50% of time spent coordinating a plan of care  Addendum-October 09, 2019-per discussion with social worker today-patient has won her appeal and will not be discharged on March 12.  -We will change this note to an acute visit for possible discharge.  And will change code to [id number]---greater than 40 minutes was spent assessing patient-reviewing her chart and labs discussing her concerns at bedside-and coordinating and formulating a plan of care for numerous  diagnoses

## 2019-10-11 ENCOUNTER — Encounter: Payer: Self-pay | Admitting: Internal Medicine

## 2019-10-11 DIAGNOSIS — J441 Chronic obstructive pulmonary disease with (acute) exacerbation: Secondary | ICD-10-CM | POA: Insufficient documentation

## 2019-10-15 ENCOUNTER — Other Ambulatory Visit: Payer: Self-pay | Admitting: Internal Medicine

## 2019-10-15 MED ORDER — CLONAZEPAM 0.5 MG PO TABS: 0.2500 mg | ORAL_TABLET | Freq: Two times a day (BID) | ORAL | 0 refills | Status: DC | PRN

## 2019-10-20 ENCOUNTER — Encounter: Payer: Self-pay | Admitting: Internal Medicine

## 2019-10-20 ENCOUNTER — Non-Acute Institutional Stay (SKILLED_NURSING_FACILITY): Payer: Medicare Other | Admitting: Internal Medicine

## 2019-10-20 DIAGNOSIS — J449 Chronic obstructive pulmonary disease, unspecified: Secondary | ICD-10-CM | POA: Diagnosis not present

## 2019-10-20 DIAGNOSIS — S72142S Displaced intertrochanteric fracture of left femur, sequela: Secondary | ICD-10-CM

## 2019-10-20 DIAGNOSIS — G2581 Restless legs syndrome: Secondary | ICD-10-CM

## 2019-10-20 DIAGNOSIS — D509 Iron deficiency anemia, unspecified: Secondary | ICD-10-CM

## 2019-10-20 DIAGNOSIS — I10 Essential (primary) hypertension: Secondary | ICD-10-CM

## 2019-10-20 NOTE — Progress Notes (Signed)
Location:    [name] Room Number: 102/P Place of Service:  SNF (31)  Provider: Estill Batten  PCP: [name], MD Patient Care Team: [name], MD as PCP - General (Internal Medicine)  Extended Emergency Contact Information Primary Emergency Contact: Kerby Moors Mobile Phone: [phone] Relation: Daughter Preferred language: English Interpreter needed? No  Code Status: Full Code Goals of care:  Advanced Directive information Advanced Directives 10/20/2019  Does Patient Have a Medical Advance Directive? Yes  Type of Advance Directive (No Data)  Does patient want to make changes to medical advance directive? No - Patient declined  Would patient like information on creating a medical advance directive? -     Allergies  Allergen Reactions  . Penicillins Diarrhea    Patient states she is allergic to 'all antibiotics' states has diarrhea and nausea.    Chief Complaint  Patient presents with  . Discharge Note    Discharge Visit    HPI:  74 y.o. female for discharge from facility on Wednesday, March 24.  She previously had been slated for discharge but won her appeal.  Apparently final discharge however is slated for Wednesday.  She has been here for rehab after being hospitalized for a fall-she sustained a left intertrochanteric fracture of the hip as well as a left acetabular fracture and a pelvic fracture.  She previously had a history of a left hip fracture that was repaired back in November.  She underwent a repair with removal of the deep hardware-she is complete a course of Lovenox for DVT prophylaxis.  She continues on aspirin 81 mg twice daily.  Her other diagnoses include COPD she recently had a flare and did respond well to prednisone she continues on Mucinex as well as albuterol nebulizers as needed and Beztri inhaler daily.  In regards to anemia hemoglobin appears to be trending up it is 9.3 on today's  lab-she is on iron although I do not believe she takes this every day secondary to concerns of may cause constipation.  She does have a history of hypertension continues on Norvasc 5 mg a day and this appears to be stable recent systolics have been in the 1 teens to 140s range.  She does have a history of anxiety and we did give her Klonopin we have reduced this dose however to 0.25 mg twice daily as needed-she states she still needs this occasionally we will give her a  limited supply on discharge with follow-up by primary care provider.  In regards to restless leg syndrome she is on Requip twice a day at 0.5 mg a day and she gets 2 mg at night apparently it is well-tolerated and helping..  She also receives Neurontin 400 mg 3 times a day.  In regards to depression she is on Wellbutrin 300 mg a day-she appears to be getting in better spirits at 1 point was thought to have some depressive symptoms but appears to be somewhat more upbeat recently.  She will be going home with family who lives in [place].  She will need continued PT OT and home health support.  As well as a wheelchair to help with ambulation.  She also has orthopedic follow-up which is actually scheduled for tomorrow      Past Medical History:  Diagnosis Date  . Anemia   . At risk for delirium   . Closed fracture of anterior column of left acetabulum (HCC)   . Closed fracture of pelvis (HCC)   .  COPD (chronic obstructive pulmonary disease) ([place])   . Depression   . HTN (hypertension)   . Hyponatremia   . Lupus ([place])   . RLS (restless legs syndrome)     Past Surgical History:  Procedure Laterality Date  . ABDOMINAL HYSTERECTOMY    . CLOSED MANIPULATION AND CLOSED TREATMENT OF LEFT ACETABULUM FRACTURE, BOTH COLUMNS Left   . HIP SURGERY Left   . REMOVAL OF DEEP HARDWARE, LEFT INTRAMEDULLARY NAIL Left    TFN  . REPAIR OF LEFT PERITROCHANTERIC MALUNION WITH USE OF A BLADE PLATE Left [id number]      reports that  she has quit smoking. She has never used smokeless tobacco. No history on file for alcohol and drug. Social History   Socioeconomic History  . Marital status: Widowed    Spouse name: Not on file  . Number of children: Not on file  . Years of education: Not on file  . Highest education level: Not on file  Occupational History  . Not on file  Tobacco Use  . Smoking status: Former Research scientist (life sciences)  . Smokeless tobacco: Never Used  Substance and Sexual Activity  . Alcohol use: Not on file  . Drug use: Not on file  . Sexual activity: Not on file  Other Topics Concern  . Not on file  Social History Narrative  . Not on file   Social Determinants of Health   Financial Resource Strain:   . Difficulty of Paying Living Expenses:   Food Insecurity:   . Worried About Charity fundraiser in the Last Year:   . Arboriculturist in the Last Year:   Transportation Needs:   . Film/video editor (Medical):   Marland Kitchen Lack of Transportation (Non-Medical):   Physical Activity:   . Days of Exercise per Week:   . Minutes of Exercise per Session:   Stress:   . Feeling of Stress :   Social Connections:   . Frequency of Communication with Friends and Family:   . Frequency of Social Gatherings with Friends and Family:   . Attends Religious Services:   . Active Member of Clubs or Organizations:   . Attends Archivist Meetings:   Marland Kitchen Marital Status:   Intimate Partner Violence:   . Fear of Current or Ex-Partner:   . Emotionally Abused:   Marland Kitchen Physically Abused:   . Sexually Abused:    Functional Status Survey:    Allergies  Allergen Reactions  . Penicillins Diarrhea    Patient states she is allergic to 'all antibiotics' states has diarrhea and nausea.    Pertinent  Health Maintenance Due  Topic Date Due  . COLONOSCOPY  Never done  . DEXA SCAN  Never done  . PNA vac Low Risk Adult (1 of 2 - PCV13) Never done  . INFLUENZA VACCINE  Completed    Medications: Allergies as of 10/20/2019       Reactions   Penicillins Diarrhea   Patient states she is allergic to 'all antibiotics' states has diarrhea and nausea.      Medication List       Accurate as of October 20, 2019  3:53 PM. If you have any questions, ask your nurse or doctor.        STOP taking these medications   clonazePAM 0.5 MG tablet Commonly known as: KlonoPIN Stopped by: Granville Lewis, PA-C   ipratropium-albuterol 0.5-2.5 (3) MG/3ML Soln Commonly known as: DUONEB Stopped by: Granville Lewis, PA-C   predniSONE 20  MG tablet Commonly known as: DELTASONE Stopped by: Edmon CrapeArlo Prabhjot Maddux, PA-C     TAKE these medications   acetaminophen 325 MG tablet Commonly known as: TYLENOL Take 325 mg by mouth every 6 (six) hours as needed for mild pain.   acetaminophen 500 MG tablet Commonly known as: TYLENOL Take 1,000 mg by mouth every 6 (six) hours as needed (For Pain).   albuterol (2.5 MG/3ML) 0.083% nebulizer solution Commonly known as: PROVENTIL Take 2.5 mg by nebulization every 8 (eight) hours as needed for wheezing or shortness of breath.   amLODipine 5 MG tablet Commonly known as: NORVASC Take 5 mg by mouth daily.   ARTIFICIAL TEARS OP Instill 2 drops both eyes three times a day as needed for dry eyes   aspirin EC 81 MG tablet Take 81 mg by mouth 2 (two) times daily. QAM and QHS   bisacodyl 10 MG suppository Commonly known as: DULCOLAX Place 10 mg rectally as needed for moderate constipation (Constopation not relieved by MOM).   Breztri Aerosphere 160-9-4.8 MCG/ACT Aero Generic drug: Budeson-Glycopyrrol-Formoterol Inhale 1 puff into the lungs.   buPROPion 300 MG 24 hr tablet Commonly known as: WELLBUTRIN XL Take 300 mg by mouth daily.   calcium carbonate 500 MG chewable tablet Commonly known as: TUMS - dosed in mg elemental calcium Chew 1 tablet by mouth daily. With meals   calcium carbonate 500 MG chewable tablet Commonly known as: TUMS - dosed in mg elemental calcium Chew 1 tablet by mouth 4 (four)  times daily as needed.   dextromethorphan-guaiFENesin 30-600 MG 12hr tablet Commonly known as: MUCINEX DM Take 2 tablets by mouth 2 (two) times daily.   docusate sodium 100 MG capsule Commonly known as: COLACE Take 100 mg by mouth daily.   ferrous sulfate 325 (65 FE) MG tablet Take 325 mg by mouth daily with breakfast. For Anemia   gabapentin 400 MG capsule Commonly known as: NEURONTIN Take 400 mg by mouth 3 (three) times daily.   magnesium hydroxide 400 MG/5ML suspension Commonly known as: MILK OF MAGNESIA Take 30 mLs by mouth daily as needed for mild constipation.   melatonin 3 MG Tabs tablet Take 3 mg by mouth at bedtime.   METAMUCIL PO Take by mouth. TAKE 1 SCOOP DAILY FOR CONSTIPATION- HOLD FOR DIARRHEA   methocarbamol 500 MG tablet Commonly known as: ROBAXIN Take 500 mg by mouth 2 (two) times daily as needed for muscle spasms.   methocarbamol 500 MG tablet Commonly known as: ROBAXIN TAKE 1/2 TABLET (250MG ) BY MOUTH TWICE DAILY AS NEEDED FOR MUSCLE SPASMS   ondansetron 4 MG tablet Commonly known as: ZOFRAN Take 4 mg by mouth every 6 (six) hours as needed for nausea or vomiting.   pantoprazole 40 MG tablet Commonly known as: PROTONIX Take 40 mg by mouth daily.   RA SALINE ENEMA RE Place 1 each rectally daily as needed (Constipation not relieved by bisacodyl).   rOPINIRole 2 MG tablet Commonly known as: REQUIP Take 2 mg by mouth at bedtime.   rOPINIRole 0.25 MG tablet Commonly known as: REQUIP Take 0.5 mg by mouth in the morning. Take 2 tablets to = 0.5 mg   simethicone 80 MG chewable tablet Commonly known as: MYLICON Chew 80 mg by mouth 3 (three) times daily as needed.   Vitamin D-3 125 MCG (5000 UT) Tabs Take by mouth. Give 5,000 units by mouth daily       Review of Systems   In general she is not complaining of any  fever or chills.  Skin does not complain of rashes or itching surgical site appears to be well-healed.  Head ears eyes nose  mouth and throat does not complain of visual changes or sore throat.  Respiratory says her breathing is stable does not really complain of increasing cough from baseline or shortness of breath she is on chronic oxygen.  Cardiac does not complain of chest pain or concerning edema.  GI does not complain of abdominal pain nausea or vomiting diarrhea-at times will have intermittent constipation she is on Colace as well as Metamucil says she did have a bowel movement this morning.  GU does not complain of dysuria.  Musculoskeletal is status post left hip repair at this point does not complain of pain.  Neurologic does not complain of dizziness headache numbness does have some history of neuropathy continues on Neurontin.  Psych does have at times some anxiety as noted above-also depressive symptoms but appears to be pretty stable and somewhat improved on Wellbutrin.    Vitals:   10/20/19 1543  BP: 130/74  Pulse: 93  Resp: 18  Temp: 98.3 F (36.8 C)  TempSrc: Oral  SpO2: 98%  Weight: 122 lb 3.2 oz (55.4 kg)  Height: 5' (1.524 m)   Body mass index is 23.87 kg/m. Physical Exam   In general this is a pleasant elderly female in no distress.  Her skin is warm and dry surgical site left hip appears to be well-healed with a residual scar that gradually is fading.  Eyes visual acuity appears to be intact sclera and conjunctive are clear.  Oropharynx is clear mucous membranes moist.  Chest is clear to auscultation there is no labored breathing air entry continues to be somewhat shallow.   Heart is regular rate and rhythm without murmur gallop or rub she does not really have significant lower extremity edema.  Abdomen is soft nontender with positive bowel sounds.  Musculoskeletal is able to move all extremities x4 at baseline with some residual left-sided weakness status post left hip repair.  Neurologic is grossly intact her speech is clear cranial nerves appear to be  intact.  Psych she is alert and oriented pleasant and appropriate   Labs reviewed:  October 20, 2019.  WBC 6.2 hemoglobin 9.3 platelets 308.  Sodium 138 potassium 4.7 BUN 21.6 creatinine 0.68.  October 09, 2019.  WBC 7.0 hemoglobin 9.1 platelets 328.  Sodium 132 potassium 4.5 BUN 14.5 creatinine 0.51 Basic Metabolic Panel: Recent Labs    09/02/19 0000 09/12/19 0000 09/25/19 0000  NA 136* 138 137  K 4.9 4.6 4.7  CL 97* 100 97*  CO2 32* 31* 32*  BUN 13 10 9   CREATININE  --  0.5 0.4*  CALCIUM 8.4* 8.7 9.0   Liver Function Tests: No results for input(s): AST, ALT, ALKPHOS, BILITOT, PROT, ALBUMIN in the last 8760 hours. No results for input(s): LIPASE, AMYLASE in the last 8760 hours. No results for input(s): AMMONIA in the last 8760 hours. CBC: Recent Labs    09/08/19 0000 09/12/19 0000 09/25/19 0000  WBC 6.0 4.2 4.6  NEUTROABS  --   --  3  HGB 7.9* 8.3* 8.8*  HCT 24* 3* 27*  PLT 476* 7* 290   Cardiac Enzymes: No results for input(s): CKTOTAL, CKMB, CKMBINDEX, TROPONINI in the last 8760 hours. BNP: Invalid input(s): POCBNP CBG: No results for input(s): GLUCAP in the last 8760 hours.  Procedures and Imaging Studies During Stay: No results found.  Assessment/Plan:    #1  history of left intertrochanteric fracture left acetabular fracture and pelvic fracture-status post repair of left peritrochanteric nonunion with use of left leg plate, removal of deep hardware left intramedullary nail, closed manipulation and closed treatment of left acetabular fracture  She will have orthopedic follow-up tomorrow-she does have orders for Robaxin twice a day as well as Tylenol as needed-pain appears to be controlled-she will need continued PT and OT as well as a wheelchair to help with ambulation.  She continues on aspirin 81 mg twice daily for prophylaxis.  2.  History of hypertension appears stable on Norvasc 5 mg a day as noted above.  3.  History of postop anemia  hemoglobin appears to be slowly rising was 9.3 on today's labs she is on iron although I believe intake is somewhat spotty because of her concerns with constipation.  4.  Constipation she is on Colace 100 mg a day she also has Metamucil which has been added-she says she had a bowel movement this morning-she also has simethicone as needed for gas pain.  Follow-up by primary care provider as needed.  5.  History of COPD and she did have a flare during her stay here but responded well to prednisone and nebulizers she continues on Mucinex 1200 mg twice daily she also has orders for as needed albuterol nebs as well as Breztri inhaler daily.  She is on chronic oxygen.  She appears to be stable respiratory wise.   #6 History of anxiety-this has been somewhat of an issue during her stay here she says she is done well with the Klonopin we have reduced the dose-as noted above we will give her a limited supply of Klonopin 0.25 mg which she can receive twice a day if needed will give 10 tablets with fu by PCP as needed  7.  History of depression she is on Wellbutrin 300 mg daily this will warrant follow-up by primary care provider this appears to be relatively stabilized she is now ready to go home at first was somewhat anxious about this.  #8 history of insomnia she is on melatonin 3 mg nightly  8.  History of restless leg syndrome as noted above she is on Requip 0.5 mg in the morning and 2 mg q. nightly.  9.  History of neuropathy she is on Neurontin to 400 mg 3 times daily-this was increased during her stay here and appears to be helping with her pain issues.   She will be going home with family she will need a wheelchair secondary to her limited mobility as well as oxygen at home.  She will need continued PT and OT as well as expedient primary care follow-up.  [id number] note greater than 30 minutes spent on this discharge summary-greater than 50% of time spent in discussion with patient at  bedside as well as formulating a plan care

## 2019-10-21 ENCOUNTER — Encounter: Payer: Self-pay | Admitting: Internal Medicine

## 2019-10-22 ENCOUNTER — Other Ambulatory Visit: Payer: Self-pay | Admitting: Family

## 2019-10-22 ENCOUNTER — Other Ambulatory Visit: Payer: Self-pay

## 2019-10-22 MED ORDER — CLONAZEPAM 0.5 MG PO TABS: 0.2500 mg | ORAL_TABLET | Freq: Two times a day (BID) | ORAL | 0 refills | Status: DC | PRN

## 2019-10-22 MED ORDER — BREZTRI AEROSPHERE 160-9-4.8 MCG/ACT IN AERO: 1.0000 | INHALATION_SPRAY | Freq: Every day | RESPIRATORY_TRACT | 0 refills | Status: DC

## 2019-10-22 MED ORDER — GUAIFENESIN ER 600 MG PO TB12: 1200.0000 mg | ORAL_TABLET | Freq: Two times a day (BID) | ORAL | 0 refills | Status: DC

## 2019-10-22 MED ORDER — ONDANSETRON HCL 4 MG PO TABS: 4.0000 mg | ORAL_TABLET | Freq: Four times a day (QID) | ORAL | 0 refills | Status: DC | PRN

## 2019-10-22 MED ORDER — ALBUTEROL SULFATE (2.5 MG/3ML) 0.083% IN NEBU: 2.5000 mg | INHALATION_SOLUTION | Freq: Three times a day (TID) | RESPIRATORY_TRACT | 0 refills | Status: DC | PRN

## 2019-10-22 MED ORDER — ARTIFICIAL TEARS 1 % OP SOLN: 2.0000 [drp] | Freq: Three times a day (TID) | OPHTHALMIC | 0 refills | Status: AC

## 2019-10-22 MED ORDER — FERROUS SULFATE 325 (65 FE) MG PO TABS: 325.0000 mg | ORAL_TABLET | Freq: Every day | ORAL | 0 refills | Status: DC

## 2019-10-22 MED ORDER — VITAMIN D-3 125 MCG (5000 UT) PO TABS: ORAL_TABLET | ORAL | 0 refills | Status: DC

## 2019-10-22 MED ORDER — METHOCARBAMOL 500 MG PO TABS: 500.0000 mg | ORAL_TABLET | Freq: Two times a day (BID) | ORAL | 0 refills | Status: DC | PRN

## 2019-10-22 MED ORDER — BUPROPION HCL ER (XL) 300 MG PO TB24: 300.0000 mg | ORAL_TABLET | Freq: Every day | ORAL | 0 refills | Status: DC

## 2019-10-22 MED ORDER — SIMETHICONE 80 MG PO CHEW: 80.0000 mg | CHEWABLE_TABLET | Freq: Three times a day (TID) | ORAL | 0 refills | Status: DC | PRN

## 2019-10-22 MED ORDER — AMLODIPINE BESYLATE 5 MG PO TABS: 5.0000 mg | ORAL_TABLET | Freq: Every day | ORAL | 0 refills | Status: DC

## 2019-10-22 MED ORDER — ROPINIROLE HCL 2 MG PO TABS: 2.0000 mg | ORAL_TABLET | Freq: Every day | ORAL | 0 refills | Status: DC

## 2019-10-22 MED ORDER — GABAPENTIN 400 MG PO CAPS: 400.0000 mg | ORAL_CAPSULE | Freq: Three times a day (TID) | ORAL | 0 refills | Status: DC

## 2019-10-22 MED ORDER — PANTOPRAZOLE SODIUM 40 MG PO TBEC: 40.0000 mg | DELAYED_RELEASE_TABLET | Freq: Every day | ORAL | 0 refills | Status: DC

## 2019-10-22 MED ORDER — ROPINIROLE HCL 0.25 MG PO TABS: 0.5000 mg | ORAL_TABLET | Freq: Every morning | ORAL | 0 refills | Status: DC

## 2019-10-27 ENCOUNTER — Encounter (HOSPITAL_COMMUNITY): Payer: Self-pay | Admitting: *Deleted

## 2019-10-27 ENCOUNTER — Emergency Department (HOSPITAL_COMMUNITY): Payer: Medicare Other

## 2019-10-27 ENCOUNTER — Inpatient Hospital Stay (HOSPITAL_COMMUNITY)
Admission: EM | Admit: 2019-10-27 | Discharge: 2019-10-31 | DRG: 536 | Disposition: A | Discharge status: Skilled Nursing Facility | Payer: Medicare Other | Attending: Internal Medicine | Admitting: Internal Medicine

## 2019-10-27 ENCOUNTER — Other Ambulatory Visit: Payer: Self-pay

## 2019-10-27 DIAGNOSIS — D509 Iron deficiency anemia, unspecified: Secondary | ICD-10-CM | POA: Diagnosis present

## 2019-10-27 DIAGNOSIS — Z87891 Personal history of nicotine dependence: Secondary | ICD-10-CM

## 2019-10-27 DIAGNOSIS — S72001A Fracture of unspecified part of neck of right femur, initial encounter for closed fracture: Secondary | ICD-10-CM

## 2019-10-27 DIAGNOSIS — Z7951 Long term (current) use of inhaled steroids: Secondary | ICD-10-CM

## 2019-10-27 DIAGNOSIS — J449 Chronic obstructive pulmonary disease, unspecified: Secondary | ICD-10-CM | POA: Diagnosis not present

## 2019-10-27 DIAGNOSIS — F419 Anxiety disorder, unspecified: Secondary | ICD-10-CM | POA: Diagnosis not present

## 2019-10-27 DIAGNOSIS — S72111A Displaced fracture of greater trochanter of right femur, initial encounter for closed fracture: Secondary | ICD-10-CM | POA: Diagnosis not present

## 2019-10-27 DIAGNOSIS — Z79899 Other long term (current) drug therapy: Secondary | ICD-10-CM | POA: Diagnosis not present

## 2019-10-27 DIAGNOSIS — I10 Essential (primary) hypertension: Secondary | ICD-10-CM | POA: Diagnosis present

## 2019-10-27 DIAGNOSIS — S72009A Fracture of unspecified part of neck of unspecified femur, initial encounter for closed fracture: Secondary | ICD-10-CM | POA: Diagnosis present

## 2019-10-27 DIAGNOSIS — Z7982 Long term (current) use of aspirin: Secondary | ICD-10-CM

## 2019-10-27 DIAGNOSIS — F329 Major depressive disorder, single episode, unspecified: Secondary | ICD-10-CM | POA: Diagnosis present

## 2019-10-27 DIAGNOSIS — J9611 Chronic respiratory failure with hypoxia: Secondary | ICD-10-CM | POA: Diagnosis not present

## 2019-10-27 DIAGNOSIS — W08XXXA Fall from other furniture, initial encounter: Secondary | ICD-10-CM | POA: Diagnosis present

## 2019-10-27 DIAGNOSIS — Z20822 Contact with and (suspected) exposure to covid-19: Secondary | ICD-10-CM | POA: Diagnosis present

## 2019-10-27 DIAGNOSIS — G2581 Restless legs syndrome: Secondary | ICD-10-CM

## 2019-10-27 DIAGNOSIS — E875 Hyperkalemia: Secondary | ICD-10-CM | POA: Diagnosis present

## 2019-10-27 DIAGNOSIS — S72114A Nondisplaced fracture of greater trochanter of right femur, initial encounter for closed fracture: Principal | ICD-10-CM | POA: Diagnosis present

## 2019-10-27 DIAGNOSIS — Z880 Allergy status to penicillin: Secondary | ICD-10-CM | POA: Diagnosis not present

## 2019-10-27 DIAGNOSIS — Y92019 Unspecified place in single-family (private) house as the place of occurrence of the external cause: Secondary | ICD-10-CM

## 2019-10-27 DIAGNOSIS — K219 Gastro-esophageal reflux disease without esophagitis: Secondary | ICD-10-CM | POA: Diagnosis present

## 2019-10-27 DIAGNOSIS — Z96642 Presence of left artificial hip joint: Secondary | ICD-10-CM | POA: Diagnosis not present

## 2019-10-27 HISTORY — DX: Personal history of other diseases of the circulatory system: Z86.79

## 2019-10-27 LAB — CBC WITH DIFFERENTIAL/PLATELET
Abs Immature Granulocytes: 0.03 10*3/uL (ref 0.00–0.07)
Basophils Absolute: 0 10*3/uL (ref 0.0–0.1)
Basophils Relative: 0 %
Eosinophils Absolute: 0.5 10*3/uL (ref 0.0–0.5)
Eosinophils Relative: 6 %
HCT: 32.3 % — ABNORMAL LOW (ref 36.0–46.0)
Hemoglobin: 9.4 g/dL — ABNORMAL LOW (ref 12.0–15.0)
Immature Granulocytes: 0 %
Lymphocytes Relative: 7 %
Lymphs Abs: 0.6 10*3/uL — ABNORMAL LOW (ref 0.7–4.0)
MCH: 27.1 pg (ref 26.0–34.0)
MCHC: 29.1 g/dL — ABNORMAL LOW (ref 30.0–36.0)
MCV: 93.1 fL (ref 80.0–100.0)
Monocytes Absolute: 0.6 10*3/uL (ref 0.1–1.0)
Monocytes Relative: 8 %
Neutro Abs: 6 10*3/uL (ref 1.7–7.7)
Neutrophils Relative %: 79 %
Platelets: 249 10*3/uL (ref 150–400)
RBC: 3.47 MIL/uL — ABNORMAL LOW (ref 3.87–5.11)
RDW: 13.9 % (ref 11.5–15.5)
WBC: 7.7 10*3/uL (ref 4.0–10.5)
nRBC: 0 % (ref 0.0–0.2)

## 2019-10-27 LAB — BASIC METABOLIC PANEL
Anion gap: 7 (ref 5–15)
BUN: 17 mg/dL (ref 8–23)
CO2: 34 mmol/L — ABNORMAL HIGH (ref 22–32)
Calcium: 9.1 mg/dL (ref 8.9–10.3)
Chloride: 95 mmol/L — ABNORMAL LOW (ref 98–111)
Creatinine, Ser: 0.6 mg/dL (ref 0.44–1.00)
GFR calc Af Amer: >60 mL/min (ref >60)
GFR calc non Af Amer: >60 mL/min (ref >60)
Glucose, Bld: 94 mg/dL (ref 70–99)
Potassium: 5.4 mmol/L — ABNORMAL HIGH (ref 3.5–5.1)
Sodium: 136 mmol/L (ref 135–145)

## 2019-10-27 LAB — RESPIRATORY PANEL BY RT PCR (FLU A&B, COVID)
Influenza A by PCR: NEGATIVE
Influenza B by PCR: NEGATIVE
SARS Coronavirus 2 by RT PCR: NEGATIVE

## 2019-10-27 MED ORDER — SIMETHICONE 80 MG PO CHEW: 80.0000 mg | CHEWABLE_TABLET | Freq: Four times a day (QID) | ORAL | Status: DC | PRN

## 2019-10-27 MED ORDER — PANTOPRAZOLE SODIUM 40 MG PO TBEC
40.0000 mg | DELAYED_RELEASE_TABLET | Freq: Every day | ORAL | Status: DC
  Administered 2019-10-28 – 2019-10-30 (×3): 40 mg via ORAL
  Filled 2019-10-27 (×4): qty 1

## 2019-10-27 MED ORDER — PSYLLIUM 95 % PO PACK
1.0000 | PACK | Freq: Two times a day (BID) | ORAL | Status: DC
  Administered 2019-10-27 – 2019-10-30 (×5): 1 via ORAL
  Filled 2019-10-27 (×6): qty 1

## 2019-10-27 MED ORDER — SODIUM CHLORIDE 0.9 % IV SOLN: INTRAVENOUS | Status: DC

## 2019-10-27 MED ORDER — CLONAZEPAM 0.25 MG PO TBDP
0.2500 mg | ORAL_TABLET | Freq: Two times a day (BID) | ORAL | Status: DC | PRN
  Administered 2019-10-27 – 2019-10-30 (×2): 0.25 mg via ORAL
  Filled 2019-10-27 (×3): qty 1

## 2019-10-27 MED ORDER — ROPINIROLE HCL 1 MG PO TABS
2.0000 mg | ORAL_TABLET | Freq: Every day | ORAL | Status: DC
  Administered 2019-10-27 – 2019-10-30 (×4): 2 mg via ORAL
  Filled 2019-10-27 (×4): qty 2

## 2019-10-27 MED ORDER — ORAL CARE MOUTH RINSE
15.0000 mL | Freq: Two times a day (BID) | OROMUCOSAL | Status: DC
  Administered 2019-10-28 – 2019-10-29 (×3): 15 mL via OROMUCOSAL

## 2019-10-27 MED ORDER — ALBUTEROL SULFATE (2.5 MG/3ML) 0.083% IN NEBU: 2.5000 mg | INHALATION_SOLUTION | Freq: Three times a day (TID) | RESPIRATORY_TRACT | Status: DC | PRN

## 2019-10-27 MED ORDER — BISACODYL 10 MG RE SUPP: 10.0000 mg | RECTAL | Status: DC | PRN

## 2019-10-27 MED ORDER — FENTANYL CITRATE (PF) 100 MCG/2ML IJ SOLN
50.0000 ug | Freq: Once | INTRAMUSCULAR | Status: AC
  Administered 2019-10-27: 50 ug via INTRAVENOUS
  Filled 2019-10-27: qty 2

## 2019-10-27 MED ORDER — GABAPENTIN 400 MG PO CAPS
400.0000 mg | ORAL_CAPSULE | Freq: Three times a day (TID) | ORAL | Status: DC
  Administered 2019-10-27 – 2019-10-30 (×10): 400 mg via ORAL
  Filled 2019-10-27 (×11): qty 1

## 2019-10-27 MED ORDER — ASPIRIN EC 81 MG PO TBEC
81.0000 mg | DELAYED_RELEASE_TABLET | Freq: Two times a day (BID) | ORAL | Status: DC
  Administered 2019-10-27 – 2019-10-30 (×7): 81 mg via ORAL
  Filled 2019-10-27 (×8): qty 1

## 2019-10-27 MED ORDER — FERROUS SULFATE 325 (65 FE) MG PO TABS
325.0000 mg | ORAL_TABLET | Freq: Every day | ORAL | Status: DC
  Administered 2019-10-28: 325 mg via ORAL
  Filled 2019-10-27 (×4): qty 1

## 2019-10-27 MED ORDER — BUPROPION HCL ER (XL) 300 MG PO TB24
300.0000 mg | ORAL_TABLET | Freq: Every day | ORAL | Status: DC
  Administered 2019-10-28 – 2019-10-30 (×3): 300 mg via ORAL
  Filled 2019-10-27 (×4): qty 1

## 2019-10-27 MED ORDER — ACETAMINOPHEN 325 MG PO TABS
650.0000 mg | ORAL_TABLET | Freq: Four times a day (QID) | ORAL | Status: DC | PRN
  Administered 2019-10-27 – 2019-10-31 (×2): 650 mg via ORAL
  Filled 2019-10-27 (×2): qty 2

## 2019-10-27 MED ORDER — MORPHINE SULFATE (PF) 4 MG/ML IV SOLN
4.0000 mg | INTRAVENOUS | Status: DC | PRN
  Administered 2019-10-29 – 2019-10-31 (×6): 4 mg via INTRAVENOUS
  Filled 2019-10-27 (×5): qty 1

## 2019-10-27 MED ORDER — ONDANSETRON HCL 4 MG/2ML IJ SOLN
4.0000 mg | Freq: Four times a day (QID) | INTRAMUSCULAR | Status: DC | PRN
  Administered 2019-10-31: 4 mg via INTRAVENOUS
  Filled 2019-10-27 (×2): qty 2

## 2019-10-27 MED ORDER — ONDANSETRON HCL 4 MG PO TABS: 4.0000 mg | ORAL_TABLET | Freq: Four times a day (QID) | ORAL | Status: DC | PRN

## 2019-10-27 MED ORDER — OXYCODONE HCL 5 MG PO TABS
5.0000 mg | ORAL_TABLET | ORAL | Status: DC | PRN
  Administered 2019-10-27 – 2019-10-30 (×8): 5 mg via ORAL
  Filled 2019-10-27 (×8): qty 1

## 2019-10-27 MED ORDER — ACETAMINOPHEN 650 MG RE SUPP: 650.0000 mg | Freq: Four times a day (QID) | RECTAL | Status: DC | PRN

## 2019-10-27 MED ORDER — CHLORHEXIDINE GLUCONATE 0.12 % MT SOLN
15.0000 mL | Freq: Two times a day (BID) | OROMUCOSAL | Status: DC
  Administered 2019-10-27 – 2019-10-30 (×7): 15 mL via OROMUCOSAL
  Filled 2019-10-27 (×8): qty 15

## 2019-10-27 MED ORDER — AMLODIPINE BESYLATE 5 MG PO TABS
5.0000 mg | ORAL_TABLET | Freq: Every day | ORAL | Status: DC
  Administered 2019-10-28 – 2019-10-30 (×3): 5 mg via ORAL
  Filled 2019-10-27 (×4): qty 1

## 2019-10-27 MED ORDER — HEPARIN SODIUM (PORCINE) 5000 UNIT/ML IJ SOLN
5000.0000 [IU] | Freq: Three times a day (TID) | INTRAMUSCULAR | Status: DC
  Administered 2019-10-27 – 2019-10-31 (×11): 5000 [IU] via SUBCUTANEOUS
  Filled 2019-10-27 (×11): qty 1

## 2019-10-27 MED ORDER — MELATONIN 3 MG PO TABS
3.0000 mg | ORAL_TABLET | Freq: Every day | ORAL | Status: DC
  Administered 2019-10-27 – 2019-10-30 (×4): 3 mg via ORAL
  Filled 2019-10-27 (×4): qty 1

## 2019-10-27 NOTE — ED Notes (Signed)
Pt eating dinner

## 2019-10-27 NOTE — Progress Notes (Signed)
Consult request has been received. CSW attempting to follow up at present time  Rozlynn Lippold M. Nolyn Eilert LCSWA Transitions of Care  Clinical Social Worker  Ph: [phone] 

## 2019-10-27 NOTE — ED Triage Notes (Signed)
Pt c/o pain to right hip and left rib cage area after fall last night. Denies LOC upon fall. Denies use of blood thinners. Pt reports she was trying to sit down and reached for the table next to her and it fall over. Pt reports she landed on her right side.

## 2019-10-27 NOTE — ED Provider Notes (Signed)
Emergency Department Provider Note   I have reviewed the triage vital signs and the nursing notes.   HISTORY  Chief Complaint Hip Injury   HPI Sharon Jordan is a 74 y.o. female with past medical history reviewed below presents to the emergency department after mechanical fall last night.  Patient states that she was attempting to transfer her over to the couch from a chair.  She was leaning on a small table when that gave way and fell over.  She fell to the ground landing on her right side.  Her son was there and had to help her onto the couch.  She has not been able to bear weight in the right leg having pain mainly in the hip.  She is also experiencing pain in the left lateral chest.  She denies shortness of breath.  No fevers or chills.  She does have history of hip replacement on the left but not on the right.  She denies any numbness or weakness in the extremities.  She did strike her head and has had a headache but is not anticoagulated.  When she was still unable to bear weight today she came to the emergency department for evaluation.  She has been taking Tylenol at home for pain.   Past Medical History:  Diagnosis Date  . Anemia   . At risk for delirium   . Closed fracture of anterior column of left acetabulum (HCC)   . Closed fracture of pelvis (HCC)   . COPD (chronic obstructive pulmonary disease) (HCC)   . Depression   . History of rheumatic fever   . HTN (hypertension)   . Hyponatremia   . Lupus (HCC)   . RLS (restless legs syndrome)     Patient Active Problem List   Diagnosis Date Noted  . Hip fracture (HCC) 10/27/2019  . Acute exacerbation of chronic obstructive pulmonary disease (COPD) (HCC) 10/11/2019  . Intertrochanteric fracture of left femur (HCC) 09/05/2019  . Fracture of acetabulum, left, closed (HCC) 09/05/2019  . Hypertension 09/05/2019  . COPD (chronic obstructive pulmonary disease) (HCC) 09/05/2019  . Depression 09/05/2019  . GERD  (gastroesophageal reflux disease) 09/05/2019  . Restless leg syndrome 09/05/2019    Past Surgical History:  Procedure Laterality Date  . ABDOMINAL HYSTERECTOMY    . APPENDECTOMY    . 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 CLOSED MANIPULATION AND CLOSED TREATMENT OF LEFT ACETABULUM FRACTURE, BOTH COLUMNS Left   . HIP SURGERY Left   . REMOVAL OF DEEP HARDWARE, LEFT INTRAMEDULLARY NAIL Left    TFN  . 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 TONSILLECTOMY      Allergies Penicillins  History reviewed. No pertinent family history.  Social History Social History   Tobacco Use  . Smoking status: Former Games developer  . Smokeless tobacco: Never Used  Substance Use Topics  . Alcohol use: Never  . Drug use: Never    Review of Systems  Constitutional: No fever/chills Eyes: No visual changes. ENT: No sore throat. Cardiovascular: Positive left chest pain. Respiratory: Denies shortness of breath. Gastrointestinal: No abdominal pain.  No nausea, no vomiting.  No diarrhea.  No constipation. Genitourinary: Negative for dysuria. Musculoskeletal: Negative for back pain. Positive right hip pain.  Skin: Negative for rash. Neurological: Negative for headaches, focal weakness or numbness.  10-point ROS otherwise negative.  ____________________________________________   PHYSICAL EXAM:  VITAL SIGNS: ED Triage Vitals  Enc Vitals Group     BP 10/27/19 1103 (!) 145/67     Pulse Rate 10/27/19 1103 88     Resp 10/27/19 1103 16     Temp 10/27/19 1103 98.4 F (36.9 C)     Temp Source 10/27/19 1103 Oral     SpO2 10/27/19 1103 100 %     Weight 10/27/19 1059 125 lb (56.7 kg)     Height 10/27/19 1059 5' (1.524 m)    Constitutional: Alert and oriented. Well appearing and in no acute distress. Eyes: Conjunctivae are normal.  Head: Atraumatic. Nose: No congestion/rhinnorhea. Mouth/Throat: Mucous membranes are moist.   Neck: No  stridor.   Cardiovascular: Normal rate, regular rhythm. Good peripheral circulation. Grossly normal heart sounds.   Respiratory: Normal respiratory effort.  No retractions. Lungs CTAB. Gastrointestinal: Soft and nontender. No distention.  Musculoskeletal: No tenderness to palpation of the right knee or ankle.  No swelling or deformity.  Patient does have pain with passive range of motion of the right hip.  Associated tenderness to palpation underneath the left breast without crepitus or paradoxical movement. Neurologic:  Normal speech and language. No gross focal neurologic deficits are appreciated.  Skin:  Skin is warm, dry and intact. No rash noted.  ____________________________________________   LABS (all labs ordered are listed, but only abnormal results are displayed)  Labs Reviewed  CBC WITH DIFFERENTIAL/PLATELET - Abnormal; Notable for the following components:      Result Value   RBC 3.47 (*)    Hemoglobin 9.4 (*)    HCT 32.3 (*)    MCHC 29.1 (*)    Lymphs Abs 0.6 (*)    All other components within normal limits  BASIC METABOLIC PANEL - Abnormal; Notable for the following components:   Potassium 5.4 (*)    Chloride 95 (*)    CO2 34 (*)    All other components within normal limits  COMPREHENSIVE METABOLIC PANEL - Abnormal; Notable for the following components:   Chloride 97 (*)    CO2 33 (*)    Calcium 8.8 (*)    Total Protein 5.3 (*)    Albumin 3.1 (*)    AST 11 (*)    All other components within normal limits  CBC WITH DIFFERENTIAL/PLATELET - Abnormal; Notable for the following components:   RBC 3.18 (*)    Hemoglobin 8.8 (*)    HCT 29.9 (*)    MCHC 29.4 (*)    All other components within normal limits  RETICULOCYTES - Abnormal; Notable for the following components:   RBC. 3.46 (*)    Immature Retic Fract 19.8 (*)    All other components within normal limits  RESPIRATORY PANEL BY RT PCR (FLU A&B, COVID)  MAGNESIUM  VITAMIN B12  FOLATE  IRON AND TIBC   FERRITIN   ____________________________________________  RADIOLOGY  CT Hip Right Wo Contrast  Result Date: 10/27/2019 CLINICAL DATA:  Right hip pain after a fall.  Initial encounter. EXAM: CT OF THE RIGHT HIP WITHOUT CONTRAST TECHNIQUE: Multidetector CT imaging of the right hip was performed according to the standard protocol. Multiplanar CT image reconstructions were also generated. COMPARISON:  Plain films right hip earlier today. FINDINGS: Bones/Joint/Cartilage Bones are osteopenic. The patient has an acute fracture through the base of the greater trochanter. The greater trochanter demonstrates medial displacement of approximately 0.5 cm and mild superior displacement. No extension of the fracture into the intertrochanteric femur is identified. There is buckling of the anterior cortex of the right sacrum  consistent with fracture, likely remote. Remote left pubic bone and inferior pubic ramus fractures are partially imaged. No lytic or sclerotic lesion is identified. The right femoral head is located. Mild right hip joint space narrowing is noted. Ligaments Suboptimally assessed by CT. Muscles and Tendons Negative. Soft tissues Imaged intrapelvic contents demonstrate sigmoid diverticulosis. The patient is status post hysterectomy. IMPRESSION: Mildly displaced fracture of the right greater trochanter. No intertrochanteric extension is identified but osteopenia limits evaluation. If there is concern for intertrochanteric extension, MRI without contrast is recommended for further evaluation. Right sacral fracture appears remote. Remote left parasymphyseal and inferior pubic ramus fractures are partially visualized. Electronically Signed   By: Inge Rise M.D.   On: 10/27/2019 15:23    ____________________________________________   PROCEDURES  Procedure(s) performed:   Procedures  None ____________________________________________   INITIAL IMPRESSION / ASSESSMENT AND PLAN / ED COURSE   Pertinent labs & imaging results that were available during my care of the patient were reviewed by me and considered in my medical decision making (see chart for details).   Patient presents emergency department for evaluation of right hip pain after fall yesterday.  Plan for plain films of that area along with plain films of the left ribs including chest.  Will also obtain CT scan of the head given head injury during the fall.  No outward sign of head trauma.  Patient not anticoagulated.  Patient with fracture of greater trochanter on CT. Dr. Roderic Palau to f/u with ortho and admit.  ____________________________________________  FINAL CLINICAL IMPRESSION(S) / ED DIAGNOSES  Final diagnoses:  Closed fracture of right hip, initial encounter [facility])     MEDICATIONS GIVEN DURING THIS VISIT:  Medications  aspirin EC tablet 81 mg (81 mg Oral Given 10/28/19 0849)  amLODipine (NORVASC) tablet 5 mg (5 mg Oral Given 10/28/19 0849)  buPROPion (WELLBUTRIN XL) 24 hr tablet 300 mg (300 mg Oral Given 10/28/19 0850)  bisacodyl (DULCOLAX) suppository 10 mg (has no administration in time range)  pantoprazole (PROTONIX) EC tablet 40 mg (40 mg Oral Given 10/28/19 0849)  psyllium (HYDROCIL/METAMUCIL) packet 1 packet (1 packet Oral Not Given 10/28/19 1257)  simethicone (MYLICON) chewable tablet 80 mg (has no administration in time range)  ferrous sulfate tablet 325 mg (325 mg Oral Given 10/28/19 0849)  melatonin tablet 3 mg (3 mg Oral Given 10/27/19 2221)  clonazePAM (KLONOPIN) disintegrating tablet 0.25 mg (0.25 mg Oral Given 10/27/19 2233)  gabapentin (NEURONTIN) capsule 400 mg (400 mg Oral Given 10/28/19 0848)  rOPINIRole (REQUIP) tablet 2 mg (2 mg Oral Given 10/27/19 2220)  albuterol (PROVENTIL) (2.5 MG/3ML) 0.083% nebulizer solution 2.5 mg (has no administration in time range)  heparin injection 5,000 Units (5,000 Units Subcutaneous Given 10/28/19 0629)  0.9 %  sodium chloride infusion ( Intravenous New Bag/Given  10/27/19 2237)  acetaminophen (TYLENOL) tablet 650 mg (650 mg Oral Given 10/27/19 2224)    Or  acetaminophen (TYLENOL) suppository 650 mg ( Rectal See Alternative 10/27/19 2224)  oxyCODONE (Oxy IR/ROXICODONE) immediate release tablet 5 mg (5 mg Oral Given 10/28/19 1256)  morphine 4 MG/ML injection 4 mg (has no administration in time range)  ondansetron (ZOFRAN) tablet 4 mg (has no administration in time range)    Or  ondansetron (ZOFRAN) injection 4 mg (has no administration in time range)  chlorhexidine (PERIDEX) 0.12 % solution 15 mL (15 mLs Mouth Rinse Given 10/28/19 0853)  MEDLINE mouth rinse (15 mLs Mouth Rinse Not Given 10/28/19 1328)  fentaNYL (SUBLIMAZE) injection 50 mcg (50 mcg  Intravenous Given 10/27/19 1137)     Note:  This document was prepared using Dragon voice recognition software and may include unintentional dictation errors.  Alona Bene, MD, [facility] Emergency Medicine    Alba Perillo, Arlyss Repress, MD 10/28/19 [phone]

## 2019-10-27 NOTE — Progress Notes (Signed)
Patient reports to EDP that she has a walker and wheelchair at home to assist with ambulation. CSW attempted to contact patient via room phone to discuss Home health provider preference without success.   CSW will assist patient by providing home health agency with referral .  Delrick Dehart Tomma Rakers Transitions of Care  Clinical Social Worker  Ph: [phone]

## 2019-10-27 NOTE — Progress Notes (Signed)
Patient ID: Sharon Jordan, female   DOB: [birth year], 74 y.o.   MRN: [id number]  74 year old female presents with greater trochanteric fracture right hip seen best on CAT scan not really seen on plain film  This is a nondisplaced nonoperative fracture.  The patient can immediately begin weightbearing as tolerated with a walker after being seen by physical therapy  This fracture has a normal healing time of 6 to 8 weeks  This fracture will require imaging studies in 2 to 3 weeks to assure that the fracture is not displaced  Patient will be seen on morning rounds tomorrow March 30.

## 2019-10-27 NOTE — ED Notes (Signed)
Paged orthopedic surgery again.  

## 2019-10-27 NOTE — ED Notes (Signed)
Pt back from x-ray.

## 2019-10-27 NOTE — H&P (Signed)
TRH H&P    Patient Demographics:    Sharon Jordan, is a 74 y.o. female  MRN: [id number]  DOB - 1946-07-25  Admit Date - 10/27/2019  Referring MD/NP/PA: Estell Harpin  Outpatient Primary MD for the patient is [name], MD  Patient coming from: Home  Chief complaint-hip pain   HPI:    Sharon Jordan  is a 74 y.o. female, with history of restless leg syndrome, hypertension, depression, COPD, and previous fractures presents to the ER with a chief complaint of right hip pain.  Patient reports that yesterday she was preparing to sit down on the couch when she tried to walk around an end table and brace on it.  Patient reports that she was not feeling woozy, having chest pain, having shortness of breath, or any other reason to be bracing on this and table, that is just how she habitually gets onto the couch.  The end table buckled beneath her weight and both she and the end table fell down.  Patient reports that she fell onto her right hip and then rolled over onto her left side and she is not sure if she hit her head.  She felt pain immediately after.  She lives with her son and daughter-in-law.  Son had to help her get up as patient could not get up on her own.  She waited through the night and part of today to see if the pain would get any better.  The pain continued to get worse.  Patient reports that upon arrival to the ED her pain was 10 out of 10.  After the fentanyl she was given in the ED of pain is 3 out of 10.  She had a doctor's appointment with her primary care physician today, but just could not make it there because she was in too much pain.  Patient denies any other symptoms at this time including but not limited to: Blurry vision, nasal congestion, sore throat, nausea, vomiting, diarrhea, constipation, dizziness, syncope, and more.  Patient does report that she has a headache, but that it feels like  her normal headache.  In the ED Temperature 98.4, pulse 96, respiratory rate 16, blood pressure 183/95, O2 sats 100%.  Afebrile and no leukocytosis.  Patient has a white blood cell count of 7.7.  CHEM panel reveals a hyperkalemia with a potassium of 5.4.  Covid test is negative.  CT hip shows displaced fracture of right greater trochanter.  X-ray ribs shows no acute rib fracture demonstrable.  Lungs clear.  No pneumothorax.  CT head shows no acute process.  Fentanyl ordered for pain.  Ortho consulted from the ED and reportedly advised that this will likely be a nonsurgical intervention with physical therapy only.  Ortho to see patient in the a.m.    Review of systems:    In addition to the HPI above,  No Fever-chills, No Headache, No changes with Vision or hearing, No problems swallowing food or Liquids, No Chest pain, Cough or Shortness of Breath, No Abdominal pain, No Nausea or Vomiting, bowel  movements are regular, No Blood in stool or Urine, No dysuria, No new skin rashes or bruises, Right hip pain, left rib pain No new weakness, tingling, numbness in any extremity, No recent weight gain or loss, No polyuria, polydypsia or polyphagia, No significant Mental Stressors.  All other systems reviewed and are negative.    Past History of the following :    Past Medical History:  Diagnosis Date  . Anemia   . At risk for delirium   . Closed fracture of anterior column of left acetabulum (HCC)   . Closed fracture of pelvis (HCC)   . COPD (chronic obstructive pulmonary disease) (HCC)   . Depression   . History of rheumatic fever   . HTN (hypertension)   . Hyponatremia   . Lupus (HCC)   . RLS (restless legs syndrome)       Past Surgical History:  Procedure Laterality Date  . ABDOMINAL HYSTERECTOMY    . APPENDECTOMY    . 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 CLOSED MANIPULATION AND CLOSED TREATMENT OF LEFT ACETABULUM FRACTURE, BOTH COLUMNS Left   . HIP  SURGERY Left   . REMOVAL OF DEEP HARDWARE, LEFT INTRAMEDULLARY NAIL Left    TFN  . 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 TONSILLECTOMY        Social History:      Social History   Tobacco Use  . Smoking status: Former Games developer  . Smokeless tobacco: Never Used  Substance Use Topics  . Alcohol use: Never       Family History :    History reviewed. No pertinent family history.    Home Medications:   Prior to Admission medications   Medication Sig Start Date End Date Taking? Authorizing Provider  acetaminophen (TYLENOL) 500 MG tablet Take 1,000 mg by mouth every 6 (six) hours as needed (For Pain).   Yes [provider]  albuterol (PROVENTIL) (2.5 MG/3ML) 0.083% nebulizer solution Take 3 mLs (2.5 mg total) by nebulization every 8 (eight) hours as needed for wheezing or shortness of breath. 10/22/19  Yes Trudie Reed, Arlo C, PA-C  amLODipine (NORVASC) 5 MG tablet Take 1 tablet (5 mg total) by mouth daily. 10/22/19  Yes Edmon Crape C, PA-C  aspirin EC 81 MG tablet Take 81 mg by mouth 2 (two) times daily. QAM and QHS   Yes [provider]  Budeson-Glycopyrrol-Formoterol (BREZTRI AEROSPHERE) 160-9-4.8 MCG/ACT AERO Inhale 1 puff into the lungs daily. 10/22/19  Yes Lassen, Arlo C, PA-C  buPROPion (WELLBUTRIN XL) 300 MG 24 hr tablet Take 1 tablet (300 mg total) by mouth daily. 10/22/19  Yes Lassen, Arlo C, PA-C  calcium carbonate (TUMS - DOSED IN MG ELEMENTAL CALCIUM) 500 MG chewable tablet Chew 1 tablet by mouth 3 (three) times daily with meals.    Yes [provider]  carboxymethylcellulose (ARTIFICIAL TEARS) 1 % ophthalmic solution Place 2 drops into both eyes 3 (three) times daily. Instill 2 drops both eyes three times a day as needed for dry eyes 10/22/19  Yes Lassen, Arlo C, PA-C  Cholecalciferol (VITAMIN D-3) 125 MCG (5000 UT) TABS Give 5,000 units by mouth daily Patient taking differently: Take 5,000 Units by mouth  daily.  10/22/19  Yes Lassen, Arlo C, PA-C  clonazePAM (KLONOPIN) 0.5 MG tablet Take 0.5 tablets (0.25 mg total) by mouth 2 (two) times daily as needed for anxiety. Patient taking differently: Take 0.25-0.5 mg  by mouth See admin instructions. Takes one tablet at bedtime. **Take one-half tablet by mouth twice daily as needed for anxiety (shaking) 10/22/19  Yes Ngetich, Dinah C, NP  docusate sodium (COLACE) 100 MG capsule Take 100 mg by mouth daily.   Yes [provider]  gabapentin (NEURONTIN) 400 MG capsule Take 1 capsule (400 mg total) by mouth 3 (three) times daily. 10/22/19  Yes Lassen, Arlo C, PA-C  guaiFENesin (MUCINEX) 600 MG 12 hr tablet Take 2 tablets (1,200 mg total) by mouth 2 (two) times daily. 10/22/19  Yes Lassen, Arlo C, PA-C  magnesium hydroxide (MILK OF MAGNESIA) 400 MG/5ML suspension Take 30 mLs by mouth daily as needed for mild constipation.   Yes [provider]  Melatonin 3 MG TABS Take 3 mg by mouth at bedtime.   Yes [provider]  methocarbamol (ROBAXIN) 500 MG tablet Take 1 tablet (500 mg total) by mouth 2 (two) times daily as needed for muscle spasms. 10/22/19  Yes Lassen, Arlo C, PA-C  ondansetron (ZOFRAN) 4 MG tablet Take 1 tablet (4 mg total) by mouth every 6 (six) hours as needed for nausea or vomiting. 10/22/19  Yes Oscar La, Arlo C, PA-C  pantoprazole (PROTONIX) 40 MG tablet Take 1 tablet (40 mg total) by mouth daily. 10/22/19  Yes Lassen, Arlo C, PA-C  Psyllium (METAMUCIL PO) Take by mouth See admin instructions. TAKE 1 SCOOP DAILY AT BEDTIME  FOR CONSTIPATION- HOLD FOR DIARRHEA   Yes [provider]  rOPINIRole (REQUIP) 2 MG tablet Take 1 tablet (2 mg total) by mouth at bedtime. Patient taking differently: Take 4 mg by mouth at bedtime.  10/22/19  Yes Oscar La, Arlo C, PA-C  simethicone (MYLICON) 80 MG chewable tablet Chew 1 tablet (80 mg total) by mouth 3 (three) times daily as needed. 10/22/19  Yes Granville Lewis C, PA-C     Allergies:       Allergies  Allergen Reactions  . Penicillins Diarrhea    Patient states she is allergic to 'all antibiotics' states has diarrhea and nausea.     Physical Exam:   Vitals  Blood pressure (!) 157/66, pulse 94, temperature 98.1 F (36.7 C), temperature source Oral, resp. rate 18, height 5' (1.524 m), weight 55.4 kg, SpO2 100 %.  1.  General: Lying supine in bed talking on telephone  2. Psychiatric: Pleasant and cooperative with exam  3. Neurologic: Cranial nerves II through XII are grossly intact no focal deficits on limited exam  4. HEENMT:  Head is atraumatic normocephalic, pupils are reactive to light, EOMI, neck is supple, trachea is midline, mucous membranes are moist  5. Respiratory : Lungs are clear to auscultation bilaterally  6. Cardiovascular : Heart rate is tachycardic, rhythm is regular  7. Gastrointestinal:  Abdomen is soft, nondistended, nontender to palpation  8. Skin:  No acute lesions on limited skin exam  9.Musculoskeletal:  Tenderness to palpation over right hip    Data Review:    CBC Recent Labs  Lab 10/27/19 1123  WBC 7.7  HGB 9.4*  HCT 32.3*  PLT 249  MCV 93.1  MCH 27.1  MCHC 29.1*  RDW 13.9  LYMPHSABS 0.6*  MONOABS 0.6  EOSABS 0.5  BASOSABS 0.0   ------------------------------------------------------------------------------------------------------------------  Results for orders placed or performed during the hospital encounter of 10/27/19 (from the past 48 hour(s))  CBC with Differential     Status: Abnormal   Collection Time: 10/27/19 11:23 AM  Result Value Ref Range   WBC 7.7 4.0 -  10.5 K/uL   RBC 3.47 (L) 3.87 - 5.11 MIL/uL   Hemoglobin 9.4 (L) 12.0 - 15.0 g/dL   HCT 99.8 (L) 33.8 - 25.0 %   MCV 93.1 80.0 - 100.0 fL   MCH 27.1 26.0 - 34.0 pg   MCHC 29.1 (L) 30.0 - 36.0 g/dL   RDW 53.9 76.7 - 34.1 %   Platelets 249 150 - 400 K/uL   nRBC 0.0 0.0 - 0.2 %   Neutrophils Relative % 79 %   Neutro Abs 6.0 1.7 - 7.7 K/uL    Lymphocytes Relative 7 %   Lymphs Abs 0.6 (L) 0.7 - 4.0 K/uL   Monocytes Relative 8 %   Monocytes Absolute 0.6 0.1 - 1.0 K/uL   Eosinophils Relative 6 %   Eosinophils Absolute 0.5 0.0 - 0.5 K/uL   Basophils Relative 0 %   Basophils Absolute 0.0 0.0 - 0.1 K/uL   Immature Granulocytes 0 %   Abs Immature Granulocytes 0.03 0.00 - 0.07 K/uL    Comment: Performed at [facility], [address], [place], [place] [address]  Basic metabolic panel     Status: Abnormal   Collection Time: 10/27/19 11:23 AM  Result Value Ref Range   Sodium 136 135 - 145 mmol/L   Potassium 5.4 (H) 3.5 - 5.1 mmol/L   Chloride 95 (L) 98 - 111 mmol/L   CO2 34 (H) 22 - 32 mmol/L   Glucose, Bld 94 70 - 99 mg/dL    Comment: Glucose reference range applies only to samples taken after fasting for at least 8 hours.   BUN 17 8 - 23 mg/dL   Creatinine, Ser 2.40 0.44 - 1.00 mg/dL   Calcium 9.1 8.9 - 97.3 mg/dL   GFR calc non Af Amer >60 >60 mL/min   GFR calc Af Amer >60 >60 mL/min   Anion gap 7 5 - 15    Comment: Performed at [facility], [address], [place], [place] [address]  Respiratory Panel by RT PCR (Flu A&B, Covid) - Nasopharyngeal Swab     Status: None   Collection Time: 10/27/19  3:32 PM   Specimen: Nasopharyngeal Swab  Result Value Ref Range   SARS Coronavirus 2 by RT PCR NEGATIVE NEGATIVE    Comment: (NOTE) SARS-CoV-2 target nucleic acids are NOT DETECTED. The SARS-CoV-2 RNA is generally detectable in upper respiratoy specimens during the acute phase of infection. The lowest concentration of SARS-CoV-2 viral copies this assay can detect is 131 copies/mL. A negative result does not preclude SARS-Cov-2 infection and should not be used as the sole basis for treatment or other patient management decisions. A negative result may occur with  improper specimen collection/handling, submission of specimen other than nasopharyngeal swab, presence of viral mutation(s) within the areas targeted by this assay, and  inadequate number of viral copies (<131 copies/mL). A negative result must be combined with clinical observations, patient history, and epidemiological information. The expected result is Negative. Fact Sheet for Patients:  https://www.moore.com/ Fact Sheet for Healthcare Providers:  https://www.young.biz/ This test is not yet ap proved or cleared by the [place] FDA and  has been authorized for detection and/or diagnosis of SARS-CoV-2 by FDA under an Emergency Use Authorization (EUA). This EUA will remain  in effect (meaning this test can be used) for the duration of the COVID-19 declaration under Section 564(b)(1) of the Act, 21 U.S.C. section 360bbb-3(b)(1), unless the authorization is terminated or revoked sooner.    Influenza A by PCR NEGATIVE NEGATIVE  Influenza B by PCR NEGATIVE NEGATIVE    Comment: (NOTE) The Xpert Xpress SARS-CoV-2/FLU/RSV assay is intended as an aid in  the diagnosis of influenza from Nasopharyngeal swab specimens and  should not be used as a sole basis for treatment. Nasal washings and  aspirates are unacceptable for Xpert Xpress SARS-CoV-2/FLU/RSV  testing. Fact Sheet for Patients: https://www.moore.com/ Fact Sheet for Healthcare Providers: https://www.young.biz/ This test is not yet approved or cleared by the [place] FDA and  has been authorized for detection and/or diagnosis of SARS-CoV-2 by  FDA under an Emergency Use Authorization (EUA). This EUA will remain  in effect (meaning this test can be used) for the duration of the  Covid-19 declaration under Section 564(b)(1) of the Act, 21  U.S.C. section 360bbb-3(b)(1), unless the authorization is  terminated or revoked. Performed at [facility], [address] [place], [place] [address]     Chemistries  Recent Labs  Lab 10/27/19 1123  NA 136  K 5.4*  CL 95*  CO2 34*  GLUCOSE 94  BUN 17  CREATININE 0.60   CALCIUM 9.1   ------------------------------------------------------------------------------------------------------------------  ------------------------------------------------------------------------------------------------------------------ GFR: Estimated Creatinine Clearance: 47.5 mL/min (by C-G formula based on SCr of 0.6 mg/dL). Liver Function Tests: No results for input(s): AST, ALT, ALKPHOS, BILITOT, PROT, ALBUMIN in the last 168 hours. No results for input(s): LIPASE, AMYLASE in the last 168 hours. No results for input(s): AMMONIA in the last 168 hours. Coagulation Profile: No results for input(s): INR, PROTIME in the last 168 hours. Cardiac Enzymes: No results for input(s): CKTOTAL, CKMB, CKMBINDEX, TROPONINI in the last 168 hours. BNP (last 3 results) No results for input(s): PROBNP in the last 8760 hours. HbA1C: No results for input(s): HGBA1C in the last 72 hours. CBG: No results for input(s): GLUCAP in the last 168 hours. Lipid Profile: No results for input(s): CHOL, HDL, LDLCALC, TRIG, CHOLHDL, LDLDIRECT in the last 72 hours. Thyroid Function Tests: No results for input(s): TSH, T4TOTAL, FREET4, T3FREE, THYROIDAB in the last 72 hours. Anemia Panel: No results for input(s): VITAMINB12, FOLATE, FERRITIN, TIBC, IRON, RETICCTPCT in the last 72 hours.  --------------------------------------------------------------------------------------------------------------- Urine analysis: No results found for: COLORURINE, APPEARANCEUR, LABSPEC, PHURINE, GLUCOSEU, HGBUR, BILIRUBINUR, KETONESUR, PROTEINUR, UROBILINOGEN, NITRITE, LEUKOCYTESUR    Imaging Results:    DG Ribs Unilateral W/Chest Left  Result Date: 10/27/2019 CLINICAL DATA:  Pain following fall EXAM: LEFT RIBS AND CHEST - 3+ VIEW COMPARISON:  None. FINDINGS: Frontal chest as well as oblique and cone-down rib images obtained. No edema or airspace opacity. Heart size and pulmonary vascularity are normal. No adenopathy.  There is no evident pneumothorax or pleural effusion. No evident rib fracture. There is an old healed fracture of the left humeral metaphysis. IMPRESSION: No acute rib fracture is demonstrable. Lungs clear. No pneumothorax. Healed fracture proximal left humerus with remodeling. Electronically Signed   By: Bretta Bang III M.D.   On: 10/27/2019 13:03   CT Head Wo Contrast  Result Date: 10/27/2019 CLINICAL DATA:  Patient status post fall. EXAM: CT HEAD WITHOUT CONTRAST TECHNIQUE: Contiguous axial images were obtained from the base of the skull through the vertex without intravenous contrast. COMPARISON:  Brain CT 08/23/2019 FINDINGS: Brain: Ventricles and sulci are appropriate for patient's age. Periventricular and subcortical white matter hypodensities compatible with chronic microvascular ischemic changes. No evidence for acute cortically based infarct, intracranial hemorrhage, mass lesion or mass-effect. Vascular: No hyperdense vessel or unexpected calcification. Skull: Normal. Negative for fracture or focal lesion. Sinuses/Orbits: No acute finding. Other: None.  IMPRESSION: No acute intracranial process. Electronically Signed   By: Annia Beltrew  Davis M.D.   On: 10/27/2019 12:29   CT Hip Right Wo Contrast  Result Date: 10/27/2019 CLINICAL DATA:  Right hip pain after a fall.  Initial encounter. EXAM: CT OF THE RIGHT HIP WITHOUT CONTRAST TECHNIQUE: Multidetector CT imaging of the right hip was performed according to the standard protocol. Multiplanar CT image reconstructions were also generated. COMPARISON:  Plain films right hip earlier today. FINDINGS: Bones/Joint/Cartilage Bones are osteopenic. The patient has an acute fracture through the base of the greater trochanter. The greater trochanter demonstrates medial displacement of approximately 0.5 cm and mild superior displacement. No extension of the fracture into the intertrochanteric femur is identified. There is buckling of the anterior cortex of the right  sacrum consistent with fracture, likely remote. Remote left pubic bone and inferior pubic ramus fractures are partially imaged. No lytic or sclerotic lesion is identified. The right femoral head is located. Mild right hip joint space narrowing is noted. Ligaments Suboptimally assessed by CT. Muscles and Tendons Negative. Soft tissues Imaged intrapelvic contents demonstrate sigmoid diverticulosis. The patient is status post hysterectomy. IMPRESSION: Mildly displaced fracture of the right greater trochanter. No intertrochanteric extension is identified but osteopenia limits evaluation. If there is concern for intertrochanteric extension, MRI without contrast is recommended for further evaluation. Right sacral fracture appears remote. Remote left parasymphyseal and inferior pubic ramus fractures are partially visualized. Electronically Signed   By: Drusilla Kannerhomas  Dalessio M.D.   On: 10/27/2019 15:23   DG Hip Unilat W or Wo Pelvis 2-3 Views Right  Result Date: 10/27/2019 CLINICAL DATA:  Pain following fall EXAM: DG HIP (WITH OR WITHOUT PELVIS) 2-3V RIGHT COMPARISON:  August 23, 2019 radiographs and CT of the left hip region. FINDINGS: Frontal pelvis as well as frontal and lateral right hip images were obtained. Postoperative changes noted in the left proximal femur with prior intertrochanteric fracture on the left in essentially anatomic alignment. Healing fracture left ischium noted. There is underlying osteoporosis. No acute fracture or dislocation. There is mild symmetric narrowing of each hip joint. IMPRESSION: Postoperative change left hip joint. Healing fracture left ischium. Underlying osteoporosis. No acute fracture or dislocation. Symmetric narrowing each hip joint, stable. Electronically Signed   By: Bretta BangWilliam  Woodruff III M.D.   On: 10/27/2019 13:01       Assessment & Plan:    Active Problems:   Hip fracture (HCC)   1. Hip fracture 1. Mechanical fall 2. CT hip shows - displaced fracture of the right  greater trochanter 3. Ortho consulted - will likely be non-surgical management per ED provider reports 4. Ortho to see patient in the AM 5. Pain control with morphine 6. NPO after midnight in case of surgical intervention 7. Continue to monitor  2. Mechanical fall 1. Patient reports fall with no preceding factors 2. Continue to monitor 3. Hyperkalemia 1. K+ 5.4 2. EKG pending 3. Monitor on tele 4. Continue IV fluids 5. Trend in the AM 4. Tachycardia 1. Heart rate 96 2. Patient reports that her heart rate normally runs high 3. Monitor on telemetry    DVT Prophylaxis- Heparin- SCDs   AM Labs Ordered, also please review Full Orders  Family Communication: No family at bedside Code Status: Full  Admission status: Inpatient :The appropriate admission status for this patient is INPATIENT. Inpatient status is judged to be reasonable and necessary in order to provide the required intensity of service to ensure the patient's safety. The patient's presenting symptoms,  physical exam findings, and initial radiographic and laboratory data in the context of their chronic comorbidities is felt to place them at high risk for further clinical deterioration. Furthermore, it is not anticipated that the patient will be medically stable for discharge from the hospital within 2 midnights of admission. The following factors support the admission status of inpatient.      I certify that at the point of admission it is my clinical judgment that the patient will require inpatient hospital care spanning beyond 2 midnights from the point of admission due to high intensity of service, high risk for further deterioration and high frequency of surveillance required.  Time spent in minutes : 60   Quintessa Simmerman B Zierle-Ghosh DO

## 2019-10-27 NOTE — ED Notes (Signed)
Attempted to assist pt to stand and pivot to her wheelchair per Dr. Edwin Dada request. Pt was able to get to side of bed with assistance and pain, but when pt attempted to stand she had "excruitating pain" and was unable to hold herself up. Pt assisted back to bed and Dr. Jacqulyn Bath notified.

## 2019-10-27 NOTE — ED Provider Notes (Signed)
Patient with a hip fracture.  I spoke with Dr. Romeo Apple and he will consult on the patient.  He does not believe the patient needs surgery to repair this but needs physical therapy and admission to the hospitalist.   Bethann Berkshire, MD 10/27/19 Serena Croissant

## 2019-10-28 DIAGNOSIS — S72111A Displaced fracture of greater trochanter of right femur, initial encounter for closed fracture: Secondary | ICD-10-CM

## 2019-10-28 LAB — COMPREHENSIVE METABOLIC PANEL
ALT: 10 U/L (ref 0–44)
AST: 11 U/L — ABNORMAL LOW (ref 15–41)
Albumin: 3.1 g/dL — ABNORMAL LOW (ref 3.5–5.0)
Alkaline Phosphatase: 74 U/L (ref 38–126)
Anion gap: 6 (ref 5–15)
BUN: 17 mg/dL (ref 8–23)
CO2: 33 mmol/L — ABNORMAL HIGH (ref 22–32)
Calcium: 8.8 mg/dL — ABNORMAL LOW (ref 8.9–10.3)
Chloride: 97 mmol/L — ABNORMAL LOW (ref 98–111)
Creatinine, Ser: 0.57 mg/dL (ref 0.44–1.00)
GFR calc Af Amer: >60 mL/min (ref >60)
GFR calc non Af Amer: >60 mL/min (ref >60)
Glucose, Bld: 89 mg/dL (ref 70–99)
Potassium: 4.7 mmol/L (ref 3.5–5.1)
Sodium: 136 mmol/L (ref 135–145)
Total Bilirubin: 0.4 mg/dL (ref 0.3–1.2)
Total Protein: 5.3 g/dL — ABNORMAL LOW (ref 6.5–8.1)

## 2019-10-28 LAB — RETICULOCYTES
Immature Retic Fract: 19.8 % — ABNORMAL HIGH (ref 2.3–15.9)
RBC.: 3.46 MIL/uL — ABNORMAL LOW (ref 3.87–5.11)
Retic Count, Absolute: 44.6 10*3/uL (ref 19.0–186.0)
Retic Ct Pct: 1.3 % (ref 0.4–3.1)

## 2019-10-28 LAB — CBC WITH DIFFERENTIAL/PLATELET
Abs Immature Granulocytes: 0.03 10*3/uL (ref 0.00–0.07)
Basophils Absolute: 0 10*3/uL (ref 0.0–0.1)
Basophils Relative: 1 %
Eosinophils Absolute: 0.4 10*3/uL (ref 0.0–0.5)
Eosinophils Relative: 6 %
HCT: 29.9 % — ABNORMAL LOW (ref 36.0–46.0)
Hemoglobin: 8.8 g/dL — ABNORMAL LOW (ref 12.0–15.0)
Immature Granulocytes: 1 %
Lymphocytes Relative: 10 %
Lymphs Abs: 0.7 10*3/uL (ref 0.7–4.0)
MCH: 27.7 pg (ref 26.0–34.0)
MCHC: 29.4 g/dL — ABNORMAL LOW (ref 30.0–36.0)
MCV: 94 fL (ref 80.0–100.0)
Monocytes Absolute: 0.5 10*3/uL (ref 0.1–1.0)
Monocytes Relative: 8 %
Neutro Abs: 4.8 10*3/uL (ref 1.7–7.7)
Neutrophils Relative %: 74 %
Platelets: 227 10*3/uL (ref 150–400)
RBC: 3.18 MIL/uL — ABNORMAL LOW (ref 3.87–5.11)
RDW: 14.1 % (ref 11.5–15.5)
WBC: 6.4 10*3/uL (ref 4.0–10.5)
nRBC: 0 % (ref 0.0–0.2)

## 2019-10-28 LAB — VITAMIN B12: Vitamin B-12: 232 pg/mL (ref 180–914)

## 2019-10-28 LAB — IRON AND TIBC
Iron: 19 ug/dL — ABNORMAL LOW (ref 28–170)
Saturation Ratios: 5 % — ABNORMAL LOW (ref 10.4–31.8)
TIBC: 356 ug/dL (ref 250–450)
UIBC: 337 ug/dL

## 2019-10-28 LAB — MAGNESIUM: Magnesium: 1.9 mg/dL (ref 1.7–2.4)

## 2019-10-28 LAB — FOLATE: Folate: 14 ng/mL (ref >5.9)

## 2019-10-28 LAB — FERRITIN: Ferritin: 18 ng/mL (ref 11–307)

## 2019-10-28 NOTE — Evaluation (Signed)
Physical Therapy Evaluation Patient Details Name: Sharon Jordan MRN: [id number] DOB: 12-11-1945 Today's Date: 10/28/2019   History of Present Illness  Sharon Jordan  is a 74 y.o. female, with history of restless leg syndrome, hypertension, depression, COPD, and previous fractures presents to the ER with a chief complaint of right hip pain.  Patient reports that yesterday she was preparing to sit down on the couch when she tried to walk around an end table and brace on it.  Patient reports that she was not feeling woozy, having chest pain, having shortness of breath, or any other reason to be bracing on this and table, that is just how she habitually gets onto the couch.  The end table buckled beneath her weight and both she and the end table fell down.  Patient reports that she fell onto her right hip and then rolled over onto her left side and she is not sure if she hit her head.  She felt pain immediately after.  She lives with her son and daughter-in-law.  Son had to help her get up as patient could not get up on her own.  She waited through the night and part of today to see if the pain would get any better.  The pain continued to get worse.  Patient reports that upon arrival to the ED her pain was 10 out of 10.  After the fentanyl she was given in the ED of pain is 3 out of 10.  She had a doctor's appointment with her primary care physician today, but just could not make it there because she was in too much pain.  Patient denies any other symptoms at this time including but not limited to: Blurry vision, nasal congestion, sore throat, nausea, vomiting, diarrhea, constipation, dizziness, syncope, and more.  Patient does report that she has a headache, but that it feels like her normal headache.    Clinical Impression  Patient limited for functional mobility as stated below secondary to BLE weakness, fatigue, pain, and poor standing balance. Patient highly motivated to work with physical  therapy today. She is able to transition to seated EOB slowly with minimal assist to upright trunk. She has c/o R hip pain and severe L Rib/flank pain throughout today's session. She demonstrates good sitting balance at edge of bed. She requires several attempts to transition to standing with RW, min/mod physical assist along with verbal cueing for sequencing. She has c/o severe L foot pain, which is abnormal, and requires verbal cueing for weight shifting between R and L LE. She is WBAT on RLE and is able to tolerate several slow, labored, lateral steps at bedside. She is limited by rib pain which makes her breathing labored as well as R hip pain. She requires assist to return to supine and she requests her supplemental o2 be put on as well as pain meds from nursing - RN notified. Patient will benefit from continued physical therapy in hospital and recommended venue below to increase strength, balance, endurance for safe ADLs and gait.     Follow Up Recommendations SNF    Equipment Recommendations  None recommended by PT    Recommendations for Other Services       Precautions / Restrictions Precautions Precautions: Fall Precaution Comments: R hip fracture, prior L hip fracture Restrictions Weight Bearing Restrictions: Yes RLE Weight Bearing: Weight bearing as tolerated      Mobility  Bed Mobility Overal bed mobility: Needs Assistance Bed Mobility: Supine to Sit;Sit to Supine  Supine to sit: Min assist Sit to supine: Min assist   General bed mobility comments: to transition to seated EOB, assist for uprighting trunk; to transition to supine, assist for LE movement  Transfers Overall transfer level: Needs assistance Equipment used: Rolling walker (2 wheeled) Transfers: Sit to/from Stand Sit to Stand: Min assist;Mod assist         General transfer comment: to transition to standing using RW, physical assist to transiton to standing, verbal cueing for sequencing and hand  placement on RW  Ambulation/Gait Ambulation/Gait assistance: Min assist;Mod assist Gait Distance (Feet): 2 Feet Assistive device: Rolling walker (2 wheeled)       General Gait Details: several small, labored lateral steps at bedside with RW, verbal cueing for sequencing  Stairs            Wheelchair Mobility    Modified Rankin (Stroke Patients Only)       Balance Overall balance assessment: Needs assistance Sitting-balance support: Feet unsupported;Bilateral upper extremity supported Sitting balance-Leahy Scale: Good Sitting balance - Comments: seated EOB   Standing balance support: Bilateral upper extremity supported Standing balance-Leahy Scale: Fair Standing balance comment: using RW                             Pertinent Vitals/Pain Pain Assessment: 0-10 Pain Score: 8  Pain Location: R hip and L Ribs Pain Intervention(s): Limited activity within patient's tolerance;Monitored during session;Patient requesting pain meds-RN notified    Home Living Family/patient expects to be discharged to:: Private residence Living Arrangements: Children Available Help at Discharge: Family;Available 24 hours/day Type of Home: House Home Access: Ramped entrance     Home Layout: One level Home Equipment: Walker - 2 wheels;Cane - single point;Bedside commode;Shower seat;Grab bars - tub/shower;Wheelchair - manual      Prior Function Level of Independence: Needs assistance   Gait / Transfers Assistance Needed: Patient was discharged from SNF last week, returned home and was very limited household ambulator with use of RW and wheelchair  ADL's / Homemaking Assistance Needed: family assists        Hand Dominance        Extremity/Trunk Assessment   Upper Extremity Assessment Upper Extremity Assessment: Generalized weakness    Lower Extremity Assessment Lower Extremity Assessment: Generalized weakness    Cervical / Trunk Assessment Cervical / Trunk  Assessment: Normal  Communication   Communication: No difficulties  Cognition Arousal/Alertness: Awake/alert Behavior During Therapy: WFL for tasks assessed/performed Overall Cognitive Status: Within Functional Limits for tasks assessed                                        General Comments      Exercises     Assessment/Plan    PT Assessment Patient needs continued PT services  PT Problem List Decreased strength;Decreased range of motion;Decreased activity tolerance;Decreased balance;Decreased mobility;Pain       PT Treatment Interventions DME instruction;Balance training;Gait training;Neuromuscular re-education;Stair training;Functional mobility training;Patient/family education;Therapeutic activities;Therapeutic exercise    PT Goals (Current goals can be found in the Care Plan section)  Acute Rehab PT Goals Patient Stated Goal: get stronger and return home PT Goal Formulation: With patient Time For Goal Achievement: 11/12/19 Potential to Achieve Goals: Good    Frequency Min 4X/week   Barriers to discharge        Co-evaluation  AM-PAC PT "6 Clicks" Mobility  Outcome Measure Help needed turning from your back to your side while in a flat bed without using bedrails?: None Help needed moving from lying on your back to sitting on the side of a flat bed without using bedrails?: A Little Help needed moving to and from a bed to a chair (including a wheelchair)?: A Lot Help needed standing up from a chair using your arms (e.g., wheelchair or bedside chair)?: A Lot Help needed to walk in hospital room?: A Lot Help needed climbing 3-5 steps with a railing? : Total 6 Click Score: 14    End of Session Equipment Utilized During Treatment: Gait belt;Oxygen Activity Tolerance: Patient tolerated treatment well;Patient limited by pain Patient left: in bed;with call bell/phone within reach;with bed alarm set Nurse Communication: Mobility  status;Patient requests pain meds PT Visit Diagnosis: Unsteadiness on feet (R26.81);Other abnormalities of gait and mobility (R26.89);Muscle weakness (generalized) (M62.81);Repeated falls (R29.6);Pain Pain - Right/Left: Right Pain - part of body: Hip(ribs -L)    Time: 1040-1109 PT Time Calculation (min) (ACUTE ONLY): 29 min   Charges:   PT Evaluation $PT Eval Low Complexity: 1 Low PT Treatments $Therapeutic Activity: 8-22 mins        12:05 PM, 10/28/19 Wyman Songster PT, DPT Physical Therapist at [facility]

## 2019-10-28 NOTE — Progress Notes (Signed)
PROGRESS NOTE    Sharon Jordan  RCV:[id number] DOB: [birth year] DOA: 10/27/2019 PCP: [name], MD   Brief Narrative:  Per HPI: Sharon Jordan  is a 74 y.o. female, with history of restless leg syndrome, hypertension, depression, COPD, and previous fractures presents to the ER with a chief complaint of right hip pain.  Patient reports that yesterday she was preparing to sit down on the couch when she tried to walk around an end table and brace on it.  Patient reports that she was not feeling woozy, having chest pain, having shortness of breath, or any other reason to be bracing on this and table, that is just how she habitually gets onto the couch.  The end table buckled beneath her weight and both she and the end table fell down.  Patient reports that she fell onto her right hip and then rolled over onto her left side and she is not sure if she hit her head.  She felt pain immediately after.  She lives with her son and daughter-in-law.  Son had to help her get up as patient could not get up on her own.  She waited through the night and part of today to see if the pain would get any better.  The pain continued to get worse.  Patient reports that upon arrival to the ED her pain was 10 out of 10.  After the fentanyl she was given in the ED of pain is 3 out of 10.  She had a doctor's appointment with her primary care physician today, but just could not make it there because she was in too much pain.  Patient denies any other symptoms at this time including but not limited to: Blurry vision, nasal congestion, sore throat, nausea, vomiting, diarrhea, constipation, dizziness, syncope, and more.  Patient does report that she has a headache, but that it feels like her normal headache.  3/30: Patient seen and evaluated this AM.  She continues to have some ongoing pain complaints, but was able to participate with PT with recommendations for SNF/rehab on discharge.  Orthopedic evaluation with no  plans for intervention at this time.  Assessment & Plan:   Active Problems:   Hip fracture (HCC)   Nondisplaced fracture of right greater trochanter status post mechanical fall -Appreciate orthopedic evaluation with plans to follow-up in approximately 2 weeks for reimaging -Continue current pain management -Discharged on DVT prophylaxis with aspirin as currently prescribed at 81 mg twice daily -Appreciate PT evaluation with recommendations for SNF/rehab, CSW working on placement  Hyperkalemia -Resolved on current lab work -DC IV fluid -Recheck in a.m.  Iron deficiency anemia -Continue ferrous sulfate -Recheck CBC in a.m. -Anemia panel  Hypertension-stable -Continue amlodipine  Restless leg syndrome -Continue gabapentin and Requip  Depression -Continue bupropion  GERD -Continue PPI  DVT prophylaxis: Heparin Code Status: Full code Family Communication: None at bedside Disposition Plan: Plan to discharge to SNF once bed is available.  No plans for operative intervention per orthopedics.   Consultants:   Orthopedics  Procedures:   None  Antimicrobials:   None   Subjective: Patient seen and evaluated today with no new acute complaints or concerns. No acute concerns or events noted overnight.  She continues to have some ongoing hip pain.  Objective: Vitals:   10/27/19 2210 10/27/19 2210 10/28/19 0202 10/28/19 0511  BP: (!) 153/73 (!) 153/73 (!) 90/45 (!) 111/51  Pulse: 92 89 87 85  Resp: 19 19 19 18   Temp: 97.8 F (36.6  C) 97.8 F (36.6 C) 98 F (36.7 C) 98.2 F (36.8 C)  TempSrc: Oral Oral Oral Oral  SpO2: 100% 100% 100% 100%  Weight:      Height:        Intake/Output Summary (Last 24 hours) at 10/28/2019 1406 Last data filed at 10/28/2019 0518 Gross per 24 hour  Intake 482.68 ml  Output 250 ml  Net 232.68 ml   Filed Weights   10/27/19 1059 10/27/19 2047  Weight: 56.7 kg 55.4 kg    Examination:  General exam: Appears calm and  comfortable  Respiratory system: Clear to auscultation. Respiratory effort normal. Cardiovascular system: S1 & S2 heard, RRR. No JVD, murmurs, rubs, gallops or clicks. No pedal edema. Gastrointestinal system: Abdomen is nondistended, soft and nontender. No organomegaly or masses felt. Normal bowel sounds heard. Central nervous system: Alert and oriented. No focal neurological deficits. Extremities: Symmetric 5 x 5 power. Skin: No rashes, lesions or ulcers Psychiatry: Judgement and insight appear normal. Mood & affect appropriate.     Data Reviewed: I have personally reviewed following labs and imaging studies  CBC: Recent Labs  Lab 10/27/19 1123 10/28/19 0444  WBC 7.7 6.4  NEUTROABS 6.0 4.8  HGB 9.4* 8.8*  HCT 32.3* 29.9*  MCV 93.1 94.0  PLT 249 227   Basic Metabolic Panel: Recent Labs  Lab 10/27/19 1123 10/28/19 0444  NA 136 136  K 5.4* 4.7  CL 95* 97*  CO2 34* 33*  GLUCOSE 94 89  BUN 17 17  CREATININE 0.60 0.57  CALCIUM 9.1 8.8*  MG  --  1.9   GFR: Estimated Creatinine Clearance: 47.5 mL/min (by C-G formula based on SCr of 0.57 mg/dL). Liver Function Tests: Recent Labs  Lab 10/28/19 0444  AST 11*  ALT 10  ALKPHOS 74  BILITOT 0.4  PROT 5.3*  ALBUMIN 3.1*   No results for input(s): LIPASE, AMYLASE in the last 168 hours. No results for input(s): AMMONIA in the last 168 hours. Coagulation Profile: No results for input(s): INR, PROTIME in the last 168 hours. Cardiac Enzymes: No results for input(s): CKTOTAL, CKMB, CKMBINDEX, TROPONINI in the last 168 hours. BNP (last 3 results) No results for input(s): PROBNP in the last 8760 hours. HbA1C: No results for input(s): HGBA1C in the last 72 hours. CBG: No results for input(s): GLUCAP in the last 168 hours. Lipid Profile: No results for input(s): CHOL, HDL, LDLCALC, TRIG, CHOLHDL, LDLDIRECT in the last 72 hours. Thyroid Function Tests: No results for input(s): TSH, T4TOTAL, FREET4, T3FREE, THYROIDAB in the  last 72 hours. Anemia Panel: Recent Labs    10/28/19 1331  RETICCTPCT 1.3   Sepsis Labs: No results for input(s): PROCALCITON, LATICACIDVEN in the last 168 hours.  Recent Results (from the past 240 hour(s))  Respiratory Panel by RT PCR (Flu A&B, Covid) - Nasopharyngeal Swab     Status: None   Collection Time: 10/27/19  3:32 PM   Specimen: Nasopharyngeal Swab  Result Value Ref Range Status   SARS Coronavirus 2 by RT PCR NEGATIVE NEGATIVE Final    Comment: (NOTE) SARS-CoV-2 target nucleic acids are NOT DETECTED. The SARS-CoV-2 RNA is generally detectable in upper respiratoy specimens during the acute phase of infection. The lowest concentration of SARS-CoV-2 viral copies this assay can detect is 131 copies/mL. A negative result does not preclude SARS-Cov-2 infection and should not be used as the sole basis for treatment or other patient management decisions. A negative result may occur with  improper specimen collection/handling, submission of  specimen other than nasopharyngeal swab, presence of viral mutation(s) within the areas targeted by this assay, and inadequate number of viral copies (<131 copies/mL). A negative result must be combined with clinical observations, patient history, and epidemiological information. The expected result is Negative. Fact Sheet for Patients:  https://www.moore.com/ Fact Sheet for Healthcare Providers:  https://www.young.biz/ This test is not yet ap proved or cleared by the [place] FDA and  has been authorized for detection and/or diagnosis of SARS-CoV-2 by FDA under an Emergency Use Authorization (EUA). This EUA will remain  in effect (meaning this test can be used) for the duration of the COVID-19 declaration under Section 564(b)(1) of the Act, 21 U.S.C. section 360bbb-3(b)(1), unless the authorization is terminated or revoked sooner.    Influenza A by PCR NEGATIVE NEGATIVE Final   Influenza B by  PCR NEGATIVE NEGATIVE Final    Comment: (NOTE) The Xpert Xpress SARS-CoV-2/FLU/RSV assay is intended as an aid in  the diagnosis of influenza from Nasopharyngeal swab specimens and  should not be used as a sole basis for treatment. Nasal washings and  aspirates are unacceptable for Xpert Xpress SARS-CoV-2/FLU/RSV  testing. Fact Sheet for Patients: https://www.moore.com/ Fact Sheet for Healthcare Providers: https://www.young.biz/ This test is not yet approved or cleared by the [place] FDA and  has been authorized for detection and/or diagnosis of SARS-CoV-2 by  FDA under an Emergency Use Authorization (EUA). This EUA will remain  in effect (meaning this test can be used) for the duration of the  Covid-19 declaration under Section 564(b)(1) of the Act, 21  U.S.C. section 360bbb-3(b)(1), unless the authorization is  terminated or revoked. Performed at [facility], [address], [place], [place] [address]          Radiology Studies: DG Ribs Unilateral W/Chest Left  Result Date: 10/27/2019 CLINICAL DATA:  Pain following fall EXAM: LEFT RIBS AND CHEST - 3+ VIEW COMPARISON:  None. FINDINGS: Frontal chest as well as oblique and cone-down rib images obtained. No edema or airspace opacity. Heart size and pulmonary vascularity are normal. No adenopathy. There is no evident pneumothorax or pleural effusion. No evident rib fracture. There is an old healed fracture of the left humeral metaphysis. IMPRESSION: No acute rib fracture is demonstrable. Lungs clear. No pneumothorax. Healed fracture proximal left humerus with remodeling. Electronically Signed   By: Bretta Bang III M.D.   On: 10/27/2019 13:03   CT Head Wo Contrast  Result Date: 10/27/2019 CLINICAL DATA:  Patient status post fall. EXAM: CT HEAD WITHOUT CONTRAST TECHNIQUE: Contiguous axial images were obtained from the base of the skull through the vertex without intravenous contrast.  COMPARISON:  Brain CT 08/23/2019 FINDINGS: Brain: Ventricles and sulci are appropriate for patient's age. Periventricular and subcortical white matter hypodensities compatible with chronic microvascular ischemic changes. No evidence for acute cortically based infarct, intracranial hemorrhage, mass lesion or mass-effect. Vascular: No hyperdense vessel or unexpected calcification. Skull: Normal. Negative for fracture or focal lesion. Sinuses/Orbits: No acute finding. Other: None. IMPRESSION: No acute intracranial process. Electronically Signed   By: Annia Belt M.D.   On: 10/27/2019 12:29   CT Hip Right Wo Contrast  Result Date: 10/27/2019 CLINICAL DATA:  Right hip pain after a fall.  Initial encounter. EXAM: CT OF THE RIGHT HIP WITHOUT CONTRAST TECHNIQUE: Multidetector CT imaging of the right hip was performed according to the standard protocol. Multiplanar CT image reconstructions were also generated. COMPARISON:  Plain films right hip earlier today. FINDINGS: Bones/Joint/Cartilage Bones are osteopenic. The patient  has an acute fracture through the base of the greater trochanter. The greater trochanter demonstrates medial displacement of approximately 0.5 cm and mild superior displacement. No extension of the fracture into the intertrochanteric femur is identified. There is buckling of the anterior cortex of the right sacrum consistent with fracture, likely remote. Remote left pubic bone and inferior pubic ramus fractures are partially imaged. No lytic or sclerotic lesion is identified. The right femoral head is located. Mild right hip joint space narrowing is noted. Ligaments Suboptimally assessed by CT. Muscles and Tendons Negative. Soft tissues Imaged intrapelvic contents demonstrate sigmoid diverticulosis. The patient is status post hysterectomy. IMPRESSION: Mildly displaced fracture of the right greater trochanter. No intertrochanteric extension is identified but osteopenia limits evaluation. If there is  concern for intertrochanteric extension, MRI without contrast is recommended for further evaluation. Right sacral fracture appears remote. Remote left parasymphyseal and inferior pubic ramus fractures are partially visualized. Electronically Signed   By: Inge Rise M.D.   On: 10/27/2019 15:23   DG Hip Unilat W or Wo Pelvis 2-3 Views Right  Result Date: 10/27/2019 CLINICAL DATA:  Pain following fall EXAM: DG HIP (WITH OR WITHOUT PELVIS) 2-3V RIGHT COMPARISON:  August 23, 2019 radiographs and CT of the left hip region. FINDINGS: Frontal pelvis as well as frontal and lateral right hip images were obtained. Postoperative changes noted in the left proximal femur with prior intertrochanteric fracture on the left in essentially anatomic alignment. Healing fracture left ischium noted. There is underlying osteoporosis. No acute fracture or dislocation. There is mild symmetric narrowing of each hip joint. IMPRESSION: Postoperative change left hip joint. Healing fracture left ischium. Underlying osteoporosis. No acute fracture or dislocation. Symmetric narrowing each hip joint, stable. Electronically Signed   By: Lowella Grip III M.D.   On: 10/27/2019 13:01        Scheduled Meds: . amLODipine  5 mg Oral Daily  . aspirin EC  81 mg Oral BID  . buPROPion  300 mg Oral Daily  . chlorhexidine  15 mL Mouth Rinse BID  . ferrous sulfate  325 mg Oral Q breakfast  . gabapentin  400 mg Oral TID  . heparin  5,000 Units Subcutaneous Q8H  . mouth rinse  15 mL Mouth Rinse q12n4p  . melatonin  3 mg Oral QHS  . pantoprazole  40 mg Oral Daily  . psyllium  1 packet Oral BID  . rOPINIRole  2 mg Oral QHS   Continuous Infusions:   LOS: 1 day    Time spent: 35 minutes    Sharon Riedlinger Darleen Crocker, DO [facility]  If 7PM-7AM, please contact night-coverage [email or web address] 10/28/2019, 2:06 PM

## 2019-10-28 NOTE — Consult Note (Signed)
Reason for Consult: Greater trochanteric fracture right hip Referring Physician: Dr. Eligah East  Sharon Jordan is an 74 y.o. female.  HPI: 74 year old female recently fractured her left hip required second surgery when she fell and fractured her hip again now presents with a new fracture in the right hip after falling on Sunday and came to the hospital on Monday with a date of injury of March 28.  She lives with her son and daughter-in-law.  She was ambulating prior to this fall.  She complains of severe right hip pain and inability to weight-bear inability to walk.  The pain is over the right greater trochanter lateral thigh proximally with no radiation  Past Medical History:  Diagnosis Date  . Anemia   . At risk for delirium   . Closed fracture of anterior column of left acetabulum (HCC)   . Closed fracture of pelvis ([place])   . COPD (chronic obstructive pulmonary disease) ([place])   . Depression   . History of rheumatic fever   . HTN (hypertension)   . Hyponatremia   . Lupus ([place])   . RLS (restless legs syndrome)     Past Surgical History:  Procedure Laterality Date  . ABDOMINAL HYSTERECTOMY    . APPENDECTOMY    . 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 CLOSED MANIPULATION AND CLOSED TREATMENT OF LEFT ACETABULUM FRACTURE, BOTH COLUMNS Left   . HIP SURGERY Left   . REMOVAL OF DEEP HARDWARE, LEFT INTRAMEDULLARY NAIL Left    TFN  . REPAIR OF LEFT PERITROCHANTERIC MALUNION WITH USE OF A BLADE PLATE Left 57/3220  . TONSILLECTOMY      History reviewed. No pertinent family history.  Social History:  reports that she has quit smoking. She has never used smokeless tobacco. She reports that she does not drink alcohol or use drugs.  Allergies:  Allergies  Allergen Reactions  . Penicillins Diarrhea    Patient states she is allergic to 'all antibiotics' states has diarrhea and nausea.    Medications: I have reviewed the patient's current  medications.  Results for orders placed or performed during the hospital encounter of 10/27/19 (from the past 48 hour(s))  CBC with Differential     Status: Abnormal   Collection Time: 10/27/19 11:23 AM  Result Value Ref Range   WBC 7.7 4.0 - 10.5 K/uL   RBC 3.47 (L) 3.87 - 5.11 MIL/uL   Hemoglobin 9.4 (L) 12.0 - 15.0 g/dL   HCT 32.3 (L) 36.0 - 46.0 %   MCV 93.1 80.0 - 100.0 fL   MCH 27.1 26.0 - 34.0 pg   MCHC 29.1 (L) 30.0 - 36.0 g/dL   RDW 13.9 11.5 - 15.5 %   Platelets 249 150 - 400 K/uL   nRBC 0.0 0.0 - 0.2 %   Neutrophils Relative % 79 %   Neutro Abs 6.0 1.7 - 7.7 K/uL   Lymphocytes Relative 7 %   Lymphs Abs 0.6 (L) 0.7 - 4.0 K/uL   Monocytes Relative 8 %   Monocytes Absolute 0.6 0.1 - 1.0 K/uL   Eosinophils Relative 6 %   Eosinophils Absolute 0.5 0.0 - 0.5 K/uL   Basophils Relative 0 %   Basophils Absolute 0.0 0.0 - 0.1 K/uL   Immature Granulocytes 0 %   Abs Immature Granulocytes 0.03 0.00 - 0.07 K/uL    Comment: Performed at [facility], [address] [place], [place] [address]  Basic metabolic panel  Status: Abnormal   Collection Time: 10/27/19 11:23 AM  Result Value Ref Range   Sodium 136 135 - 145 mmol/L   Potassium 5.4 (H) 3.5 - 5.1 mmol/L   Chloride 95 (L) 98 - 111 mmol/L   CO2 34 (H) 22 - 32 mmol/L   Glucose, Bld 94 70 - 99 mg/dL    Comment: Glucose reference range applies only to samples taken after fasting for at least 8 hours.   BUN 17 8 - 23 mg/dL   Creatinine, Ser 9.02 0.44 - 1.00 mg/dL   Calcium 9.1 8.9 - 40.9 mg/dL   GFR calc non Af Amer >60 >60 mL/min   GFR calc Af Amer >60 >60 mL/min   Anion gap 7 5 - 15    Comment: Performed at [facility], [address] [place], [place] [address]  Respiratory Panel by RT PCR (Flu A&B, Covid) - Nasopharyngeal Swab     Status: None   Collection Time: 10/27/19  3:32 PM   Specimen: Nasopharyngeal Swab  Result Value Ref Range   SARS Coronavirus 2 by RT PCR NEGATIVE NEGATIVE    Comment: (NOTE) SARS-CoV-2  target nucleic acids are NOT DETECTED. The SARS-CoV-2 RNA is generally detectable in upper respiratoy specimens during the acute phase of infection. The lowest concentration of SARS-CoV-2 viral copies this assay can detect is 131 copies/mL. A negative result does not preclude SARS-Cov-2 infection and should not be used as the sole basis for treatment or other patient management decisions. A negative result may occur with  improper specimen collection/handling, submission of specimen other than nasopharyngeal swab, presence of viral mutation(s) within the areas targeted by this assay, and inadequate number of viral copies (<131 copies/mL). A negative result must be combined with clinical observations, patient history, and epidemiological information. The expected result is Negative. Fact Sheet for Patients:  https://www.moore.com/ Fact Sheet for Healthcare Providers:  https://www.young.biz/ This test is not yet ap proved or cleared by the [place] FDA and  has been authorized for detection and/or diagnosis of SARS-CoV-2 by FDA under an Emergency Use Authorization (EUA). This EUA will remain  in effect (meaning this test can be used) for the duration of the COVID-19 declaration under Section 564(b)(1) of the Act, 21 U.S.C. section 360bbb-3(b)(1), unless the authorization is terminated or revoked sooner.    Influenza A by PCR NEGATIVE NEGATIVE   Influenza B by PCR NEGATIVE NEGATIVE    Comment: (NOTE) The Xpert Xpress SARS-CoV-2/FLU/RSV assay is intended as an aid in  the diagnosis of influenza from Nasopharyngeal swab specimens and  should not be used as a sole basis for treatment. Nasal washings and  aspirates are unacceptable for Xpert Xpress SARS-CoV-2/FLU/RSV  testing. Fact Sheet for Patients: https://www.moore.com/ Fact Sheet for Healthcare Providers: https://www.young.biz/ This test is not yet  approved or cleared by the [place] FDA and  has been authorized for detection and/or diagnosis of SARS-CoV-2 by  FDA under an Emergency Use Authorization (EUA). This EUA will remain  in effect (meaning this test can be used) for the duration of the  Covid-19 declaration under Section 564(b)(1) of the Act, 21  U.S.C. section 360bbb-3(b)(1), unless the authorization is  terminated or revoked. Performed at [facility], [address] [place], [place] [address]   Comprehensive metabolic panel     Status: Abnormal   Collection Time: 10/28/19  4:44 AM  Result Value Ref Range   Sodium 136 135 - 145 mmol/L   Potassium 4.7 3.5 - 5.1 mmol/L  Chloride 97 (L) 98 - 111 mmol/L   CO2 33 (H) 22 - 32 mmol/L   Glucose, Bld 89 70 - 99 mg/dL    Comment: Glucose reference range applies only to samples taken after fasting for at least 8 hours.   BUN 17 8 - 23 mg/dL   Creatinine, Ser 4.090.57 0.44 - 1.00 mg/dL   Calcium 8.8 (L) 8.9 - 10.3 mg/dL   Total Protein 5.3 (L) 6.5 - 8.1 g/dL   Albumin 3.1 (L) 3.5 - 5.0 g/dL   AST 11 (L) 15 - 41 U/L   ALT 10 0 - 44 U/L   Alkaline Phosphatase 74 38 - 126 U/L   Total Bilirubin 0.4 0.3 - 1.2 mg/dL   GFR calc non Af Amer >60 >60 mL/min   GFR calc Af Amer >60 >60 mL/min   Anion gap 6 5 - 15    Comment: Performed at [facility], [address] [place], [place] [address]  Magnesium     Status: None   Collection Time: 10/28/19  4:44 AM  Result Value Ref Range   Magnesium 1.9 1.7 - 2.4 mg/dL    Comment: Performed at [facility], [address] [place], [place] [address]  CBC WITH DIFFERENTIAL     Status: Abnormal   Collection Time: 10/28/19  4:44 AM  Result Value Ref Range   WBC 6.4 4.0 - 10.5 K/uL   RBC 3.18 (L) 3.87 - 5.11 MIL/uL   Hemoglobin 8.8 (L) 12.0 - 15.0 g/dL   HCT 56.229.9 (L) 13.036.0 - 86.546.0 %   MCV 94.0 80.0 - 100.0 fL   MCH 27.7 26.0 - 34.0 pg   MCHC 29.4 (L) 30.0 - 36.0 g/dL   RDW 78.414.1 69.611.5 - 29.515.5 %   Platelets 227 150 - 400 K/uL   nRBC 0.0 0.0 -  0.2 %   Neutrophils Relative % 74 %   Neutro Abs 4.8 1.7 - 7.7 K/uL   Lymphocytes Relative 10 %   Lymphs Abs 0.7 0.7 - 4.0 K/uL   Monocytes Relative 8 %   Monocytes Absolute 0.5 0.1 - 1.0 K/uL   Eosinophils Relative 6 %   Eosinophils Absolute 0.4 0.0 - 0.5 K/uL   Basophils Relative 1 %   Basophils Absolute 0.0 0.0 - 0.1 K/uL   Immature Granulocytes 1 %   Abs Immature Granulocytes 0.03 0.00 - 0.07 K/uL    Comment: Performed at [facility], [address] [place], [place] [address]    DG Ribs Unilateral W/Chest Left  Result Date: 10/27/2019 CLINICAL DATA:  Pain following fall EXAM: LEFT RIBS AND CHEST - 3+ VIEW COMPARISON:  None. FINDINGS: Frontal chest as well as oblique and cone-down rib images obtained. No edema or airspace opacity. Heart size and pulmonary vascularity are normal. No adenopathy. There is no evident pneumothorax or pleural effusion. No evident rib fracture. There is an old healed fracture of the left humeral metaphysis. IMPRESSION: No acute rib fracture is demonstrable. Lungs clear. No pneumothorax. Healed fracture proximal left humerus with remodeling. Electronically Signed   By: Bretta BangWilliam  Woodruff III M.D.   On: 10/27/2019 13:03   CT Head Wo Contrast  Result Date: 10/27/2019 CLINICAL DATA:  Patient status post fall. EXAM: CT HEAD WITHOUT CONTRAST TECHNIQUE: Contiguous axial images were obtained from the base of the skull through the vertex without intravenous contrast. COMPARISON:  Brain CT 08/23/2019 FINDINGS: Brain: Ventricles and sulci are appropriate for patient's age. Periventricular and subcortical white matter hypodensities compatible with chronic  microvascular ischemic changes. No evidence for acute cortically based infarct, intracranial hemorrhage, mass lesion or mass-effect. Vascular: No hyperdense vessel or unexpected calcification. Skull: Normal. Negative for fracture or focal lesion. Sinuses/Orbits: No acute finding. Other: None. IMPRESSION: No acute intracranial  process. Electronically Signed   By: Annia Belt M.D.   On: 10/27/2019 12:29   CT Hip Right Wo Contrast  Result Date: 10/27/2019 CLINICAL DATA:  Right hip pain after a fall.  Initial encounter. EXAM: CT OF THE RIGHT HIP WITHOUT CONTRAST TECHNIQUE: Multidetector CT imaging of the right hip was performed according to the standard protocol. Multiplanar CT image reconstructions were also generated. COMPARISON:  Plain films right hip earlier today. FINDINGS: Bones/Joint/Cartilage Bones are osteopenic. The patient has an acute fracture through the base of the greater trochanter. The greater trochanter demonstrates medial displacement of approximately 0.5 cm and mild superior displacement. No extension of the fracture into the intertrochanteric femur is identified. There is buckling of the anterior cortex of the right sacrum consistent with fracture, likely remote. Remote left pubic bone and inferior pubic ramus fractures are partially imaged. No lytic or sclerotic lesion is identified. The right femoral head is located. Mild right hip joint space narrowing is noted. Ligaments Suboptimally assessed by CT. Muscles and Tendons Negative. Soft tissues Imaged intrapelvic contents demonstrate sigmoid diverticulosis. The patient is status post hysterectomy. IMPRESSION: Mildly displaced fracture of the right greater trochanter. No intertrochanteric extension is identified but osteopenia limits evaluation. If there is concern for intertrochanteric extension, MRI without contrast is recommended for further evaluation. Right sacral fracture appears remote. Remote left parasymphyseal and inferior pubic ramus fractures are partially visualized. Electronically Signed   By: Drusilla Kanner M.D.   On: 10/27/2019 15:23   DG Hip Unilat W or Wo Pelvis 2-3 Views Right  Result Date: 10/27/2019 CLINICAL DATA:  Pain following fall EXAM: DG HIP (WITH OR WITHOUT PELVIS) 2-3V RIGHT COMPARISON:  August 23, 2019 radiographs and CT of the  left hip region. FINDINGS: Frontal pelvis as well as frontal and lateral right hip images were obtained. Postoperative changes noted in the left proximal femur with prior intertrochanteric fracture on the left in essentially anatomic alignment. Healing fracture left ischium noted. There is underlying osteoporosis. No acute fracture or dislocation. There is mild symmetric narrowing of each hip joint. IMPRESSION: Postoperative change left hip joint. Healing fracture left ischium. Underlying osteoporosis. No acute fracture or dislocation. Symmetric narrowing each hip joint, stable. Electronically Signed   By: Bretta Bang III M.D.   On: 10/27/2019 13:01    Review of Systems  Constitutional: Negative for fever.  HENT: Negative for congestion.   Respiratory: Negative for chest tightness.   Cardiovascular: Negative for chest pain.  All other systems reviewed and are negative.  Blood pressure (!) 111/51, pulse 85, temperature 98.2 F (36.8 C), temperature source Oral, resp. rate 18, height 5' (1.524 m), weight 55.4 kg, SpO2 100 %. Physical Exam  General appearance her frame is small normal development Awake alert and oriented x3 Mood pleasant affect normal Gait unable to walk  Right hip pain Tenderness over the right greater trochanter Skin shows no bruising No motion in terms of hip flexion ankle motion normal Hip knee and ankle are stable (hip stable on x-ray) Motor exam normal Neurovascular exam is intact  Upper extremities no contracture subluxation atrophy or tremor  Left lower extremity proximal hip incision    Assessment/Plan: Personal review of the images show a nondisplaced greater trochanteric fracture seen on CT  scan is comminuted.  Is not displaced.  The plain films hip and pelvis show no evidence of the fracture  Recommend weightbearing as tolerated with physical therapy assistance gait training walker  X-ray 6 weeks  Approximate healing time 6 to 8  weeks  Recommend at minimum aspirin for DVT prophylaxis  Follow-up in my office 2 weeks from tomorrow for x-ray  Fuller Canada 10/28/2019, 8:13 AM

## 2019-10-28 NOTE — NC FL2 (Signed)
[place] LEVEL OF CARE SCREENING TOOL     IDENTIFICATION  Patient Name: Sharon Jordan Birthdate: 12/22/1945 Sex: female Admission Date (Current Location): 10/27/2019  [facility] and [place] Number:  [employer] and Address:  [place] [address] [place]      Provider Number: [id number]  Attending Physician Name and Address:  Rodena Goldmann, DO  Relative Name and Phone Number:       Current Level of Care: Hospital Recommended Level of Care: [place] Prior Approval Number:    Date Approved/Denied:   PASRR Number: [id number]  Discharge Plan: SNF    Current Diagnoses: Patient Active Problem List   Diagnosis Date Noted  . Hip fracture ([place]) 10/27/2019  . Acute exacerbation of chronic obstructive pulmonary disease (COPD) ([place]) 10/11/2019  . Intertrochanteric fracture of left femur ([place]) 09/05/2019  . Fracture of acetabulum, left, closed ([place]) 09/05/2019  . Hypertension 09/05/2019  . COPD (chronic obstructive pulmonary disease) ([place]) 09/05/2019  . Depression 09/05/2019  . GERD (gastroesophageal reflux disease) 09/05/2019  . Restless leg syndrome 09/05/2019    Orientation RESPIRATION BLADDER Height & Weight     Self, Time, Situation, Place  O2(See dc summary) Continent Weight: 122 lb 2.2 oz (55.4 kg) Height:  5' (152.4 cm)  BEHAVIORAL SYMPTOMS/MOOD NEUROLOGICAL BOWEL NUTRITION STATUS      Continent Diet(see dc summary)  AMBULATORY STATUS COMMUNICATION OF NEEDS Skin   Extensive Assist Verbally Normal                       Personal Care Assistance Level of Assistance  Bathing, Feeding, Dressing Bathing Assistance: Limited assistance Feeding assistance: Independent Dressing Assistance: Limited assistance     Functional Limitations Info  Sight, Hearing, Speech Sight Info: Adequate Hearing Info: Adequate Speech Info: Adequate    SPECIAL CARE FACTORS FREQUENCY  PT (By licensed PT),  OT (By licensed OT)     PT Frequency: 5x week OT Frequency: 3x week            Contractures Contractures Info: Not present    Additional Factors Info  Code Status, Allergies Code Status Info: Full Allergies Info: Penicillins           Current Medications (10/28/2019):  This is the current hospital active medication list Current Facility-Administered Medications  Medication Dose Route Frequency Provider Last Rate Last Admin  . 0.9 %  sodium chloride infusion   Intravenous Continuous Zierle-Ghosh, Asia B, DO 75 mL/hr at 10/27/19 2237 New Bag at 10/27/19 2237  . acetaminophen (TYLENOL) tablet 650 mg  650 mg Oral Q6H PRN Zierle-Ghosh, Asia B, DO   650 mg at 10/27/19 2224   Or  . acetaminophen (TYLENOL) suppository 650 mg  650 mg Rectal Q6H PRN Zierle-Ghosh, Asia B, DO      . albuterol (PROVENTIL) (2.5 MG/3ML) 0.083% nebulizer solution 2.5 mg  2.5 mg Nebulization Q8H PRN Zierle-Ghosh, Asia B, DO      . amLODipine (NORVASC) tablet 5 mg  5 mg Oral Daily Zierle-Ghosh, Asia B, DO   5 mg at 10/28/19 0849  . aspirin EC tablet 81 mg  81 mg Oral BID Zierle-Ghosh, Asia B, DO   81 mg at 10/28/19 0849  . bisacodyl (DULCOLAX) suppository 10 mg  10 mg Rectal PRN Zierle-Ghosh, Asia B, DO      . buPROPion (WELLBUTRIN XL) 24 hr tablet 300 mg  300 mg Oral Daily Zierle-Ghosh, Asia B, DO  300 mg at 10/28/19 0850  . chlorhexidine (PERIDEX) 0.12 % solution 15 mL  15 mL Mouth Rinse BID Zierle-Ghosh, Asia B, DO   15 mL at 10/28/19 0853  . clonazePAM (KLONOPIN) disintegrating tablet 0.25 mg  0.25 mg Oral BID PRN Zierle-Ghosh, Asia B, DO   0.25 mg at 10/27/19 2233  . ferrous sulfate tablet 325 mg  325 mg Oral Q breakfast Zierle-Ghosh, Asia B, DO   325 mg at 10/28/19 0849  . gabapentin (NEURONTIN) capsule 400 mg  400 mg Oral TID Zierle-Ghosh, Asia B, DO   400 mg at 10/28/19 0848  . heparin injection 5,000 Units  5,000 Units Subcutaneous Q8H Zierle-Ghosh, Asia B, DO   5,000 Units at 10/28/19 0629  . MEDLINE  mouth rinse  15 mL Mouth Rinse q12n4p Zierle-Ghosh, Asia B, DO      . melatonin tablet 3 mg  3 mg Oral QHS Zierle-Ghosh, Asia B, DO   3 mg at 10/27/19 2221  . morphine 4 MG/ML injection 4 mg  4 mg Intravenous Q4H PRN Zierle-Ghosh, Asia B, DO      . ondansetron (ZOFRAN) tablet 4 mg  4 mg Oral Q6H PRN Zierle-Ghosh, Asia B, DO       Or  . ondansetron (ZOFRAN) injection 4 mg  4 mg Intravenous Q6H PRN Zierle-Ghosh, Asia B, DO      . oxyCODONE (Oxy IR/ROXICODONE) immediate release tablet 5 mg  5 mg Oral Q4H PRN Zierle-Ghosh, Asia B, DO   5 mg at 10/28/19 1256  . pantoprazole (PROTONIX) EC tablet 40 mg  40 mg Oral Daily Zierle-Ghosh, Asia B, DO   40 mg at 10/28/19 0849  . psyllium (HYDROCIL/METAMUCIL) packet 1 packet  1 packet Oral BID Zierle-Ghosh, Asia B, DO   1 packet at 10/27/19 2220  . rOPINIRole (REQUIP) tablet 2 mg  2 mg Oral QHS Zierle-Ghosh, Asia B, DO   2 mg at 10/27/19 2220  . simethicone (MYLICON) chewable tablet 80 mg  80 mg Oral Q6H PRN Zierle-Ghosh, Asia B, DO         Discharge Medications: Please see discharge summary for a list of discharge medications.  Relevant Imaging Results:  Relevant Lab Results:   Additional Information SSN: [id number] LCSW

## 2019-10-28 NOTE — Plan of Care (Signed)
  Problem: Acute Rehab PT Goals(only PT should resolve) Goal: Pt Will Go Supine/Side To Sit Outcome: Progressing Flowsheets (Taken 10/28/2019 1207) Pt will go Supine/Side to Sit: with supervision Goal: Patient Will Perform Sitting Balance Outcome: Progressing Flowsheets (Taken 10/28/2019 1207) Patient will perform sitting balance: with modified independence Goal: Patient Will Transfer Sit To/From Stand Outcome: Progressing Flowsheets (Taken 10/28/2019 1207) Patient will transfer sit to/from stand: with min guard assist Goal: Pt Will Transfer Bed To Chair/Chair To Bed Outcome: Progressing Flowsheets (Taken 10/28/2019 1207) Pt will Transfer Bed to Chair/Chair to Bed: with min assist Goal: Pt Will Ambulate Outcome: Progressing Flowsheets (Taken 10/28/2019 1207) Pt will Ambulate:  15 feet  with supervision  with least restrictive assistive device Goal: Pt/caregiver will Perform Home Exercise Program Outcome: Progressing Flowsheets (Taken 10/28/2019 1207) Pt/caregiver will Perform Home Exercise Program:  For increased strengthening  For improved balance  Independently   12:09 PM, 10/28/19 Wyman Songster PT, DPT Physical Therapist at [facility]

## 2019-10-28 NOTE — TOC Initial Note (Signed)
Transition of Care [facility]) - Initial/Assessment Note    Patient Details  Name: Sharon Jordan MRN: [id number] Date of Birth: [birth year]  Transition of Care [facility]) CM/SW Contact:    Shade Flood, LCSW Phone Number: 10/28/2019, 1:57 PM  Clinical Narrative:                  Pt admitted from home. PT recommending SNF. Spoke with pt who states that she has been at [facility] in the past and she would like to go there again. Will refer out. Per MD, anticipating dc in 1-2 days.  Will follow.  Expected Discharge Plan: Skilled Nursing Facility Barriers to Discharge: Continued Medical Work up   Patient Goals and CMS Choice Patient states their goals for this hospitalization and ongoing recovery are:: rehab CMS Medicare.gov Compare Post Acute Care list provided to:: Patient Choice offered to / list presented to : Patient  Expected Discharge Plan and Services Expected Discharge Plan: [place] In-house Referral: Clinical Social Work   Post Acute Care Choice: [place] Living arrangements for the past 2 months: [place]                                      Prior Living Arrangements/Services Living arrangements for the past 2 months: [place] with:: Self Patient language and need for interpreter reviewed:: Yes Do you feel safe going back to the place where you live?: Yes      Need for Family Participation in Patient Care: No (Comment) Care giver support system in place?: No (comment)   Criminal Activity/Legal Involvement Pertinent to Current Situation/Hospitalization: No - Comment as needed  Activities of Daily Living Home Assistive Devices/Equipment: Cane (specify quad or straight), Walker (specify type), Wheelchair, Dentures (specify type), Eyeglasses, Bedside commode/3-in-1 ADL Screening (condition at time of admission) Patient's cognitive ability adequate to safely complete daily activities?: Yes Is the patient  deaf or have difficulty hearing?: No Does the patient have difficulty seeing, even when wearing glasses/contacts?: No Does the patient have difficulty concentrating, remembering, or making decisions?: No Patient able to express need for assistance with ADLs?: Yes Does the patient have difficulty dressing or bathing?: Yes Independently performs ADLs?: No Communication: Independent Dressing (OT): Needs assistance Is this a change from baseline?: Change from baseline, expected to last <3days Grooming: Needs assistance Is this a change from baseline?: Change from baseline, expected to last <3 days Feeding: Independent Bathing: Needs assistance Is this a change from baseline?: Change from baseline, expected to last <3 days Toileting: Needs assistance Is this a change from baseline?: Change from baseline, expected to last <3 days In/Out Bed: Needs assistance Is this a change from baseline?: Change from baseline, expected to last <3 days Walks in Home: Needs assistance Is this a change from baseline?: Pre-admission baseline Does the patient have difficulty walking or climbing stairs?: Yes Weakness of Legs: Right Weakness of Arms/Hands: None  Permission Sought/Granted Permission sought to share information with : Chartered certified accountant granted to share information with : Yes, Verbal Permission Granted     Permission granted to share info w AGENCY: Health and safety inspector        Emotional Assessment   Attitude/Demeanor/Rapport: Engaged Affect (typically observed): Pleasant Orientation: : Oriented to Self, Oriented to Place, Oriented to  Time, Oriented to Situation Alcohol / Substance Use: Not Applicable Psych Involvement: No (comment)  Admission diagnosis:  Hip fracture (HCC) [S72.009A] Closed fracture of right hip, initial encounter [facility]) [S72.001A] Patient Active Problem List   Diagnosis Date Noted  . Hip fracture (HCC) 10/27/2019  . Acute exacerbation of chronic  obstructive pulmonary disease (COPD) (HCC) 10/11/2019  . Intertrochanteric fracture of left femur (HCC) 09/05/2019  . Fracture of acetabulum, left, closed (HCC) 09/05/2019  . Hypertension 09/05/2019  . COPD (chronic obstructive pulmonary disease) (HCC) 09/05/2019  . Depression 09/05/2019  . GERD (gastroesophageal reflux disease) 09/05/2019  . Restless leg syndrome 09/05/2019   PCP:  [name], MD Pharmacy:   [facility] [place], [place] - [address] [place] [address] Phone: [phone] Fax: [phone]     Social Determinants of Health (SDOH) Interventions    Readmission Risk Interventions Readmission Risk Prevention Plan 10/28/2019  Transportation Screening Complete  Home Care Screening Complete  Medication Review (RN CM) Complete

## 2019-10-29 DIAGNOSIS — S72001A Fracture of unspecified part of neck of right femur, initial encounter for closed fracture: Secondary | ICD-10-CM

## 2019-10-29 LAB — BASIC METABOLIC PANEL
Anion gap: 7 (ref 5–15)
BUN: 13 mg/dL (ref 8–23)
CO2: 33 mmol/L — ABNORMAL HIGH (ref 22–32)
Calcium: 8.5 mg/dL — ABNORMAL LOW (ref 8.9–10.3)
Chloride: 95 mmol/L — ABNORMAL LOW (ref 98–111)
Creatinine, Ser: 0.5 mg/dL (ref 0.44–1.00)
GFR calc Af Amer: >60 mL/min (ref >60)
GFR calc non Af Amer: >60 mL/min (ref >60)
Glucose, Bld: 104 mg/dL — ABNORMAL HIGH (ref 70–99)
Potassium: 4.6 mmol/L (ref 3.5–5.1)
Sodium: 135 mmol/L (ref 135–145)

## 2019-10-29 LAB — CBC
HCT: 29.3 % — ABNORMAL LOW (ref 36.0–46.0)
Hemoglobin: 8.4 g/dL — ABNORMAL LOW (ref 12.0–15.0)
MCH: 27 pg (ref 26.0–34.0)
MCHC: 28.7 g/dL — ABNORMAL LOW (ref 30.0–36.0)
MCV: 94.2 fL (ref 80.0–100.0)
Platelets: 227 10*3/uL (ref 150–400)
RBC: 3.11 MIL/uL — ABNORMAL LOW (ref 3.87–5.11)
RDW: 14.3 % (ref 11.5–15.5)
WBC: 6 10*3/uL (ref 4.0–10.5)
nRBC: 0 % (ref 0.0–0.2)

## 2019-10-29 LAB — MAGNESIUM: Magnesium: 1.7 mg/dL (ref 1.7–2.4)

## 2019-10-29 NOTE — Care Management Important Message (Signed)
Important Message  Patient Details  Name: Sharon Jordan MRN: [id number] Date of Birth: [birth year]   Medicare Important Message Given:  Yes     Corey Harold 10/29/2019, 2:39 PM

## 2019-10-29 NOTE — TOC Progression Note (Signed)
Transition of Care [facility]) - Progression Note    Patient Details  Name: Sharon Jordan MRN: [id number] Date of Birth: 1946/05/24  Transition of Care [facility]) CM/SW Contact  Elliot Gault, LCSW Phone Number: 10/29/2019, 3:24 PM  Clinical Narrative:     TOC following. Notified by Morrie Sheldon at [facility] that they do not currently have bed availability. Spoke with pt and then with pt's DIL by phone at pt request to inquire on next options. Reviewed SNF rehab choices and Medicare ratings. Referred as requested by pt/family.  Assigned TOC will follow.  Expected Discharge Plan: Skilled Nursing Facility Barriers to Discharge: Continued Medical Work up  Expected Discharge Plan and Services Expected Discharge Plan: Skilled Nursing Facility In-house Referral: Clinical Social Work   Post Acute Care Choice: Skilled Nursing Facility Living arrangements for the past 2 months: Single Family Home                                       Social Determinants of Health (SDOH) Interventions    Readmission Risk Interventions Readmission Risk Prevention Plan 10/28/2019  Transportation Screening Complete  Home Care Screening Complete  Medication Review (RN CM) Complete

## 2019-10-29 NOTE — Progress Notes (Signed)
PROGRESS NOTE    Sharon Jordan  TMH:[id number] DOB: [birth year] DOA: 10/27/2019 PCP: [name], MD   Brief Narrative:  Per HPI: Sharon Jordan  is a 74 y.o. female, with history of restless leg syndrome, hypertension, depression, COPD, and previous fractures presents to the ER with a chief complaint of right hip pain.  Patient reports that yesterday she was preparing to sit down on the couch when she tried to walk around an end table and brace on it.  Patient reports that she was not feeling woozy, having chest pain, having shortness of breath, or any other reason to be bracing on this and table, that is just how she habitually gets onto the couch.  The end table buckled beneath her weight and both she and the end table fell down.  Patient reports that she fell onto her right hip and then rolled over onto her left side and she is not sure if she hit her head.  She felt pain immediately after.  She lives with her son and daughter-in-law.  Son had to help her get up as patient could not get up on her own.  She waited through the night and part of today to see if the pain would get any better.  The pain continued to get worse.  Patient reports that upon arrival to the ED her pain was 10 out of 10.  After the fentanyl she was given in the ED of pain is 3 out of 10.  She had a doctor's appointment with her primary care physician today, but just could not make it there because she was in too much pain.  Patient denies any other symptoms at this time including but not limited to: Blurry vision, nasal congestion, sore throat, nausea, vomiting, diarrhea, constipation, dizziness, syncope, and more.  Patient does report that she has a headache, but that it feels like her normal headache.  3/30: Patient seen and evaluated this AM.  She continues to have some ongoing pain complaints, but was able to participate with PT with recommendations for SNF/rehab on discharge.  Orthopedic evaluation with no  plans for intervention at this time.  3/31: No shortness of breath, no nausea, no vomiting.  Continue complaining of right hip pain and left sided costal discomfort.  Will continue pain medication, schedule 3 times daily Tylenol; follow insurance clearance for nursing home discharge for further care and conditioning.  Assessment & Plan:   Active Problems:   Hip fracture (HCC)   Nondisplaced fracture of right greater trochanter status post mechanical fall -Appreciate orthopedic evaluation with plans to follow-up in approximately 2 weeks for reimaging -Continue current pain management -Discharged on DVT prophylaxis with aspirin as currently prescribed at 81 mg twice daily -Appreciate PT evaluation with recommendations for SNF/rehab, CSW working on placement  Hyperkalemia -Resolved  -Will discontinue telemetry -Continue intermittent evaluation of basic metabolic panel to follow potassium and electrolytes stability.  Iron deficiency anemia -Continue ferrous sulfate -Follow CBC intermittently to assess hemoglobin trend. -No signs of overt bleeding appreciated.  Hypertension-stable -Continue amlodipine  Restless leg syndrome -Continue gabapentin and Requip  Depression -Continue bupropion  GERD -Continue PPI  DVT prophylaxis: Heparin Code Status: Full code Family Communication: None at bedside Disposition Plan: Plan to discharge to SNF once bed is available.  No plans for operative intervention per orthopedics.  Outpatient follow-up with Dr. Romeo Apple in 2 weeks counting from 10/29/19.  Continue pain management.  Physical therapy with weightbearing as tolerated.   Consultants:   Orthopedics  Procedures:   None  Antimicrobials:   None   Subjective: No fever, no SOB, no nausea, no vomiting.  Continue complaining of right hip pain and also left-sided costal discomfort.  No requiring oxygen supplementation, no nausea or vomiting.  Objective: Vitals:   10/28/19 1213  10/28/19 2123 10/29/19 0602 10/29/19 1349  BP:  123/60 131/61 (!) 105/54  Pulse:  (!) 102 91 88  Resp:  19 17 19   Temp:  99.6 F (37.6 C) 98.6 F (37 C) 98.3 F (36.8 C)  TempSrc:  Oral  Oral  SpO2: 98% 95% 100% 90%  Weight:      Height:        Intake/Output Summary (Last 24 hours) at 10/29/2019 1409 Last data filed at 10/29/2019 0500 Gross per 24 hour  Intake --  Output 1350 ml  Net -1350 ml   Filed Weights   10/27/19 1059 10/27/19 2047  Weight: 56.7 kg 55.4 kg    Examination: General exam: Alert, awake, and cooperative with examination; complaining of right hip pain and also left sided rib cage discomfort.  No shortness of breath, no chest pain, no nausea, no vomiting. Respiratory system: Clear to auscultation. Respiratory effort normal.  No requiring oxygen supplementation. Cardiovascular system:RRR. No murmurs, rubs, gallops. Gastrointestinal system: Abdomen is nondistended, soft and nontender. No organomegaly or masses felt. Normal bowel sounds heard. Central nervous system: Alert and oriented. No focal neurological deficits. Extremities: No cyanosis or clubbing. Skin: No rashes, no petechiae. Psychiatry: Mood & affect appropriate.   Data Reviewed: I have personally reviewed following labs and imaging studies  CBC: Recent Labs  Lab 10/27/19 1123 10/28/19 0444 10/29/19 0440  WBC 7.7 6.4 6.0  NEUTROABS 6.0 4.8  --   HGB 9.4* 8.8* 8.4*  HCT 32.3* 29.9* 29.3*  MCV 93.1 94.0 94.2  PLT 249 227 227   Basic Metabolic Panel: Recent Labs  Lab 10/27/19 1123 10/28/19 0444 10/29/19 0440  NA 136 136 135  K 5.4* 4.7 4.6  CL 95* 97* 95*  CO2 34* 33* 33*  GLUCOSE 94 89 104*  BUN 17 17 13   CREATININE 0.60 0.57 0.50  CALCIUM 9.1 8.8* 8.5*  MG  --  1.9 1.7   GFR: Estimated Creatinine Clearance: 47.5 mL/min (by C-G formula based on SCr of 0.5 mg/dL).   Liver Function Tests: Recent Labs  Lab 10/28/19 0444  AST 11*  ALT 10  ALKPHOS 74  BILITOT 0.4  PROT 5.3*   ALBUMIN 3.1*   Anemia Panel: Recent Labs    10/28/19 1331  VITAMINB12 232  FOLATE 14.0  FERRITIN 18  TIBC 356  IRON 19*  RETICCTPCT 1.3    Recent Results (from the past 240 hour(s))  Respiratory Panel by RT PCR (Flu A&B, Covid) - Nasopharyngeal Swab     Status: None   Collection Time: 10/27/19  3:32 PM   Specimen: Nasopharyngeal Swab  Result Value Ref Range Status   SARS Coronavirus 2 by RT PCR NEGATIVE NEGATIVE Final    Comment: (NOTE) SARS-CoV-2 target nucleic acids are NOT DETECTED. The SARS-CoV-2 RNA is generally detectable in upper respiratoy specimens during the acute phase of infection. The lowest concentration of SARS-CoV-2 viral copies this assay can detect is 131 copies/mL. A negative result does not preclude SARS-Cov-2 infection and should not be used as the sole basis for treatment or other patient management decisions. A negative result may occur with  improper specimen collection/handling, submission of specimen other than nasopharyngeal swab, presence of viral mutation(s) within  the areas targeted by this assay, and inadequate number of viral copies (<131 copies/mL). A negative result must be combined with clinical observations, patient history, and epidemiological information. The expected result is Negative. Fact Sheet for Patients:  https://www.moore.com/ Fact Sheet for Healthcare Providers:  https://www.young.biz/ This test is not yet ap proved or cleared by the [place] FDA and  has been authorized for detection and/or diagnosis of SARS-CoV-2 by FDA under an Emergency Use Authorization (EUA). This EUA will remain  in effect (meaning this test can be used) for the duration of the COVID-19 declaration under Section 564(b)(1) of the Act, 21 U.S.C. section 360bbb-3(b)(1), unless the authorization is terminated or revoked sooner.    Influenza A by PCR NEGATIVE NEGATIVE Final   Influenza B by PCR NEGATIVE  NEGATIVE Final    Comment: (NOTE) The Xpert Xpress SARS-CoV-2/FLU/RSV assay is intended as an aid in  the diagnosis of influenza from Nasopharyngeal swab specimens and  should not be used as a sole basis for treatment. Nasal washings and  aspirates are unacceptable for Xpert Xpress SARS-CoV-2/FLU/RSV  testing. Fact Sheet for Patients: https://www.moore.com/ Fact Sheet for Healthcare Providers: https://www.young.biz/ This test is not yet approved or cleared by the [place] FDA and  has been authorized for detection and/or diagnosis of SARS-CoV-2 by  FDA under an Emergency Use Authorization (EUA). This EUA will remain  in effect (meaning this test can be used) for the duration of the  Covid-19 declaration under Section 564(b)(1) of the Act, 21  U.S.C. section 360bbb-3(b)(1), unless the authorization is  terminated or revoked. Performed at [facility], [address], [place], [place] [address]      Radiology Studies: CT Hip Right Wo Contrast  Result Date: 10/27/2019 CLINICAL DATA:  Right hip pain after a fall.  Initial encounter. EXAM: CT OF THE RIGHT HIP WITHOUT CONTRAST TECHNIQUE: Multidetector CT imaging of the right hip was performed according to the standard protocol. Multiplanar CT image reconstructions were also generated. COMPARISON:  Plain films right hip earlier today. FINDINGS: Bones/Joint/Cartilage Bones are osteopenic. The patient has an acute fracture through the base of the greater trochanter. The greater trochanter demonstrates medial displacement of approximately 0.5 cm and mild superior displacement. No extension of the fracture into the intertrochanteric femur is identified. There is buckling of the anterior cortex of the right sacrum consistent with fracture, likely remote. Remote left pubic bone and inferior pubic ramus fractures are partially imaged. No lytic or sclerotic lesion is identified. The right femoral head is located.  Mild right hip joint space narrowing is noted. Ligaments Suboptimally assessed by CT. Muscles and Tendons Negative. Soft tissues Imaged intrapelvic contents demonstrate sigmoid diverticulosis. The patient is status post hysterectomy. IMPRESSION: Mildly displaced fracture of the right greater trochanter. No intertrochanteric extension is identified but osteopenia limits evaluation. If there is concern for intertrochanteric extension, MRI without contrast is recommended for further evaluation. Right sacral fracture appears remote. Remote left parasymphyseal and inferior pubic ramus fractures are partially visualized. Electronically Signed   By: Drusilla Kanner M.D.   On: 10/27/2019 15:23   Scheduled Meds: . amLODipine  5 mg Oral Daily  . aspirin EC  81 mg Oral BID  . buPROPion  300 mg Oral Daily  . chlorhexidine  15 mL Mouth Rinse BID  . ferrous sulfate  325 mg Oral Q breakfast  . gabapentin  400 mg Oral TID  . heparin  5,000 Units Subcutaneous Q8H  . mouth rinse  15 mL Mouth Rinse q12n4p  .  melatonin  3 mg Oral QHS  . pantoprazole  40 mg Oral Daily  . psyllium  1 packet Oral BID  . rOPINIRole  2 mg Oral QHS   Continuous Infusions:   LOS: 2 days    Time spent: 30 minutes    Barton Dubois, MD [phone] [facility]   10/29/2019, 2:09 PM

## 2019-10-29 NOTE — Plan of Care (Signed)

## 2019-10-29 NOTE — Progress Notes (Signed)
Physical Therapy Treatment Patient Details Name: Sharon Jordan MRN: [id number] DOB: 18-Nov-1945 Today's Date: 10/29/2019    History of Present Illness Sharon Jordan  is a 74 y.o. female, with history of restless leg syndrome, hypertension, depression, COPD, and previous fractures presents to the ER with a chief complaint of right hip pain.  Patient reports that yesterday she was preparing to sit down on the couch when she tried to walk around an end table and brace on it.  Patient reports that she was not feeling woozy, having chest pain, having shortness of breath, or any other reason to be bracing on this and table, that is just how she habitually gets onto the couch.  The end table buckled beneath her weight and both she and the end table fell down.  Patient reports that she fell onto her right hip and then rolled over onto her left side and she is not sure if she hit her head.  She felt pain immediately after.  She lives with her son and daughter-in-law.  Son had to help her get up as patient could not get up on her own.  She waited through the night and part of today to see if the pain would get any better.  The pain continued to get worse.  Patient reports that upon arrival to the ED her pain was 10 out of 10.  After the fentanyl she was given in the ED of pain is 3 out of 10.  She had a doctor's appointment with her primary care physician today, but just could not make it there because she was in too much pain.  Patient denies any other symptoms at this time including but not limited to: Blurry vision, nasal congestion, sore throat, nausea, vomiting, diarrhea, constipation, dizziness, syncope, and more.  Patient does report that she has a headache, but that it feels like her normal headache.    PT Comments    Pt limited by pain in her lt ribs today.  Unable to assist with Lt UE due to rib pain.  PT initially did not want to get up due to her pain level but ended up participating and felt  better sitting than in the bed.  Pt will need continued skilled PT to improve pt's mobility.   Follow Up Recommendations  SNF     Equipment Recommendations  None recommended by PT    Recommendations for Other Services       Precautions / Restrictions Precautions Precautions: Fall Precaution Comments: R hip fracture, prior L hip fracture. Lt rib pain Restrictions Weight Bearing Restrictions: Yes RLE Weight Bearing: Weight bearing as tolerated    Mobility  Bed Mobility Overal bed mobility: Needs Assistance Bed Mobility: Supine to Sit;Sit to Supine     Supine to sit: Mod assist     General bed mobility comments: to transition to seated EOB, assist for uprighting trunk; to transition to supine, assist for LE movement  Transfers Overall transfer level: Needs assistance Equipment used: None Transfers: Sit to/from Stand Sit to Stand: Max assist         General transfer comment: to transition to standing using RW, physical assist to transiton to standing, verbal cueing for sequencing and hand placement on RW  Ambulation/Gait     Assistive device: None       General Gait Details: several small, labored lateral steps at bedside with RW, verbal cueing for sequencing   Stairs  Wheelchair Mobility    Modified Rankin (Stroke Patients Only)       Balance                                            Cognition Arousal/Alertness: Awake/alert Behavior During Therapy: WFL for tasks assessed/performed Overall Cognitive Status: Within Functional Limits for tasks assessed                                        Exercises General Exercises - Lower Extremity Ankle Circles/Pumps: Both;10 reps Quad Sets: Both;10 reps Gluteal Sets: Both;10 reps Heel Slides: Both;5 reps Hip ABduction/ADduction: Both;5 reps    General Comments        Pertinent Vitals/Pain Pain Assessment: 0-10 Pain Score: 8  Pain Location: R hip  and L Ribs Pain Descriptors / Indicators: Sore Pain Intervention(s): Limited activity within patient's tolerance;Other (comment)(requested nurse to give pain meds)       Prior Function   Household ambulation with RW         PT Goals (current goals can now be found in the care plan section) Acute Rehab PT Goals Patient Stated Goal: get stronger and return home PT Goal Formulation: With patient Time For Goal Achievement: 11/12/19 Potential to Achieve Goals: Good Progress towards PT goals: Not progressing toward goals - comment(Pt states pain is greateer today, unable to assist as well as yesterday)    Frequency    Min 4X/week      PT Plan      Co-evaluation              AM-PAC PT "6 Clicks" Mobility   Outcome Measure  Help needed turning from your back to your side while in a flat bed without using bedrails?: None Help needed moving from lying on your back to sitting on the side of a flat bed without using bedrails?: A Little Help needed moving to and from a bed to a chair (including a wheelchair)?: A Lot Help needed standing up from a chair using your arms (e.g., wheelchair or bedside chair)?: A Lot Help needed to walk in hospital room?: A Lot Help needed climbing 3-5 steps with a railing? : Total 6 Click Score: 14    End of Session Equipment Utilized During Treatment: Gait belt;Oxygen Activity Tolerance: Patient tolerated treatment well;Patient limited by pain Patient left: in bed;with call bell/phone within reach;with bed alarm set Nurse Communication: Mobility status;Patient requests pain meds PT Visit Diagnosis: Unsteadiness on feet (R26.81);Other abnormalities of gait and mobility (R26.89);Muscle weakness (generalized) (M62.81);Repeated falls (R29.6);Pain Pain - Right/Left: Right Pain - part of body: Hip(ribs -L)     Time: 1100-1136 PT Time Calculation (min) (ACUTE ONLY): 36 min  Charges:  $Therapeutic Exercise: 8-22 mins $Therapeutic Activity: 8-22  mins                        Virgina Organ, PT CLT [phone] 10/29/2019, 11:36 AM

## 2019-10-30 NOTE — Progress Notes (Signed)
Patient ID: Sharon Jordan, female   DOB: Aug 18, 1945, 74 y.o.   MRN: [id number] Right greater trochanteric fracture of the hip  Chart review  Progressive weightbearing as tolerated with physical therapy assistance gait training walker   X-ray 6 weeks   Approximate healing time 6 to 8 weeks   Recommend at minimum aspirin for DVT prophylaxis   Follow-up in my office 2 weeks from tomorrow for x-ray   Fuller Canada 10/28/2019, 8:13 AM  ambulation as tolerated

## 2019-10-30 NOTE — Progress Notes (Addendum)
PROGRESS NOTE    Sharon Jordan  IHK:[id number] DOB: July 22, 1946 DOA: 10/27/2019 PCP: [name], MD   Brief Narrative:  Per HPI: Sharon Jordan  is a 74 y.o. female, with history of restless leg syndrome, hypertension, depression, COPD, and previous fractures presents to the ER with a chief complaint of right hip pain.  Patient reports that yesterday she was preparing to sit down on the couch when she tried to walk around an end table and brace on it.  Patient reports that she was not feeling woozy, having chest pain, having shortness of breath, or any other reason to be bracing on this and table, that is just how she habitually gets onto the couch.  The end table buckled beneath her weight and both she and the end table fell down.  Patient reports that she fell onto her right hip and then rolled over onto her left side and she is not sure if she hit her head.  She felt pain immediately after.  She lives with her son and daughter-in-law.  Son had to help her get up as patient could not get up on her own.  She waited through the night and part of today to see if the pain would get any better.  The pain continued to get worse.  Patient reports that upon arrival to the ED her pain was 10 out of 10.  After the fentanyl she was given in the ED of pain is 3 out of 10.  She had a doctor's appointment with her primary care physician today, but just could not make it there because she was in too much pain.  Patient denies any other symptoms at this time including but not limited to: Blurry vision, nasal congestion, sore throat, nausea, vomiting, diarrhea, constipation, dizziness, syncope, and more.  Patient does report that she has a headache, but that it feels like her normal headache.  3/30: Patient seen and evaluated this AM.  She continues to have some ongoing pain complaints, but was able to participate with PT with recommendations for SNF/rehab on discharge.  Orthopedic evaluation with no  plans for intervention at this time.  3/31: No shortness of breath, no nausea, no vomiting.  Continue complaining of right hip pain and left sided costal discomfort.  Will continue pain medication, schedule 3 times daily Tylenol; follow insurance clearance for nursing home discharge for further care and conditioning.  10/30/19: No shortness of breath, no nausea, no vomiting, no overnight events.  Has remained hemodynamically stable.  Continue analgesics and increase physical activity as tolerated.  Patient is clear to discharge to skilled nursing facility once bed available.  Assessment & Plan:   Active Problems:   Hip fracture (HCC)   Nondisplaced fracture of right greater trochanter status post mechanical fall -Appreciate orthopedic evaluation with plans to follow-up in approximately 2 weeks for reimaging -Continue current pain management -Discharged on DVT prophylaxis with aspirin as currently prescribed at 81 mg twice daily -Appreciate PT evaluation with recommendations for SNF/rehab, CSW working on placement  Hyperkalemia -Resolved  -Will discontinue telemetry -Continue intermittent evaluation of basic metabolic panel to follow potassium and electrolytes stability.  Iron deficiency anemia -Continue ferrous sulfate -Follow CBC intermittently to assess hemoglobin trend. -No signs of overt bleeding appreciated.  Hypertension-stable -Continue amlodipine  Restless leg syndrome -Continue gabapentin and Requip  Depression -Continue bupropion  GERD -Continue PPI  Chronic respiratory failure with hypoxia: In the setting of COPD -No shortness of breath or wheezing on exam -Continue bronchodilators  regimen -Continue chronic oxygen supplementation (patient uses 2-2.5 L at nighttime every night; and then on as-needed basis throughout the day mainly on exertion).  DVT prophylaxis: Heparin Code Status: Full code Family Communication: None at bedside Disposition Plan: Plan to  discharge to SNF once bed is available.  No plans for operative intervention per orthopedics.  Outpatient follow-up with Dr. Aline Brochure in 2 weeks counting from 10/30/19.  Continue pain management.  Physical therapy with weightbearing as tolerated.   Consultants:   Orthopedics  Procedures:   None  Antimicrobials:   None   Subjective: No fever, no shortness of breath, no nausea, no vomiting.  Patient reports continued intermittent pain in her right hip and left rib cage area.  Objective: Vitals:   10/29/19 2027 10/29/19 2106 10/30/19 0647 10/30/19 1424  BP:  120/79 132/66 121/62  Pulse:  95 (!) 102 89  Resp:  19 18 16   Temp:  98.1 F (36.7 C) 98.8 F (37.1 C) 98.9 F (37.2 C)  TempSrc:  Oral Oral Oral  SpO2: 92% 95% 98% 100%  Weight:      Height:        Intake/Output Summary (Last 24 hours) at 10/30/2019 1553 Last data filed at 10/30/2019 1400 Gross per 24 hour  Intake 240 ml  Output 1200 ml  Net -960 ml   Filed Weights   10/27/19 1059 10/27/19 2047  Weight: 56.7 kg 55.4 kg    Examination: General exam: Alert, awake, oriented x 2 (missed year) uncooperative with examination.  Continue complaining intermittently of right hip pain and also pain of her left costal area. No nausea, no SOB, no vomiting. Respiratory system: Clear to auscultation. Respiratory effort normal.  Good oxygen saturation on chronic 2 L supplementation. Cardiovascular system:RRR. No murmurs, rubs, gallops. Gastrointestinal system: Abdomen is nondistended, soft and nontender. No organomegaly or masses felt. Normal bowel sounds heard. Central nervous system: Alert and oriented. No focal neurological deficits. Extremities: No cyanosis or clubbing. Skin: No rashes, petechiae. Psychiatry: Mood & affect appropriate.   Data Reviewed: I have personally reviewed following labs and imaging studies  CBC: Recent Labs  Lab 10/27/19 1123 10/28/19 0444 10/29/19 0440  WBC 7.7 6.4 6.0  NEUTROABS 6.0 4.8  --    HGB 9.4* 8.8* 8.4*  HCT 32.3* 29.9* 29.3*  MCV 93.1 94.0 94.2  PLT 249 227 833   Basic Metabolic Panel: Recent Labs  Lab 10/27/19 1123 10/28/19 0444 10/29/19 0440  NA 136 136 135  K 5.4* 4.7 4.6  CL 95* 97* 95*  CO2 34* 33* 33*  GLUCOSE 94 89 104*  BUN 17 17 13   CREATININE 0.60 0.57 0.50  CALCIUM 9.1 8.8* 8.5*  MG  --  1.9 1.7   GFR: Estimated Creatinine Clearance: 47.5 mL/min (by C-G formula based on SCr of 0.5 mg/dL).   Liver Function Tests: Recent Labs  Lab 10/28/19 0444  AST 11*  ALT 10  ALKPHOS 74  BILITOT 0.4  PROT 5.3*  ALBUMIN 3.1*   Anemia Panel: Recent Labs    10/28/19 1331  VITAMINB12 232  FOLATE 14.0  FERRITIN 18  TIBC 356  IRON 19*  RETICCTPCT 1.3    Recent Results (from the past 240 hour(s))  Respiratory Panel by RT PCR (Flu A&B, Covid) - Nasopharyngeal Swab     Status: None   Collection Time: 10/27/19  3:32 PM   Specimen: Nasopharyngeal Swab  Result Value Ref Range Status   SARS Coronavirus 2 by RT PCR NEGATIVE NEGATIVE Final  Comment: (NOTE) SARS-CoV-2 target nucleic acids are NOT DETECTED. The SARS-CoV-2 RNA is generally detectable in upper respiratoy specimens during the acute phase of infection. The lowest concentration of SARS-CoV-2 viral copies this assay can detect is 131 copies/mL. A negative result does not preclude SARS-Cov-2 infection and should not be used as the sole basis for treatment or other patient management decisions. A negative result may occur with  improper specimen collection/handling, submission of specimen other than nasopharyngeal swab, presence of viral mutation(s) within the areas targeted by this assay, and inadequate number of viral copies (<131 copies/mL). A negative result must be combined with clinical observations, patient history, and epidemiological information. The expected result is Negative. Fact Sheet for Patients:  https://www.moore.com/ Fact Sheet for Healthcare  Providers:  https://www.young.biz/ This test is not yet ap proved or cleared by the [place] FDA and  has been authorized for detection and/or diagnosis of SARS-CoV-2 by FDA under an Emergency Use Authorization (EUA). This EUA will remain  in effect (meaning this test can be used) for the duration of the COVID-19 declaration under Section 564(b)(1) of the Act, 21 U.S.C. section 360bbb-3(b)(1), unless the authorization is terminated or revoked sooner.    Influenza A by PCR NEGATIVE NEGATIVE Final   Influenza B by PCR NEGATIVE NEGATIVE Final    Comment: (NOTE) The Xpert Xpress SARS-CoV-2/FLU/RSV assay is intended as an aid in  the diagnosis of influenza from Nasopharyngeal swab specimens and  should not be used as a sole basis for treatment. Nasal washings and  aspirates are unacceptable for Xpert Xpress SARS-CoV-2/FLU/RSV  testing. Fact Sheet for Patients: https://www.moore.com/ Fact Sheet for Healthcare Providers: https://www.young.biz/ This test is not yet approved or cleared by the [place] FDA and  has been authorized for detection and/or diagnosis of SARS-CoV-2 by  FDA under an Emergency Use Authorization (EUA). This EUA will remain  in effect (meaning this test can be used) for the duration of the  Covid-19 declaration under Section 564(b)(1) of the Act, 21  U.S.C. section 360bbb-3(b)(1), unless the authorization is  terminated or revoked. Performed at [facility], [address], [place], [place] [address]      Radiology Studies: No results found. Scheduled Meds: . amLODipine  5 mg Oral Daily  . aspirin EC  81 mg Oral BID  . buPROPion  300 mg Oral Daily  . chlorhexidine  15 mL Mouth Rinse BID  . ferrous sulfate  325 mg Oral Q breakfast  . gabapentin  400 mg Oral TID  . heparin  5,000 Units Subcutaneous Q8H  . mouth rinse  15 mL Mouth Rinse q12n4p  . melatonin  3 mg Oral QHS  . pantoprazole  40 mg  Oral Daily  . psyllium  1 packet Oral BID  . rOPINIRole  2 mg Oral QHS   Continuous Infusions:   LOS: 3 days    Time spent: 30 minutes    Vassie Loll, MD [phone] [facility]   10/30/2019, 3:53 PM

## 2019-10-30 NOTE — Progress Notes (Signed)
Physical Therapy Treatment Patient Details Name: Sharon Jordan MRN: [id number] DOB: 04/02/1946 Today's Date: 10/30/2019    History of Present Illness Chauntelle Azpeitia  is a 74 y.o. female, with history of restless leg syndrome, hypertension, depression, COPD, and previous fractures presents to the ER with a chief complaint of right hip pain.  Patient reports that yesterday she was preparing to sit down on the couch when she tried to walk around an end table and brace on it.  Patient reports that she was not feeling woozy, having chest pain, having shortness of breath, or any other reason to be bracing on this and table, that is just how she habitually gets onto the couch.  The end table buckled beneath her weight and both she and the end table fell down.  Patient reports that she fell onto her right hip and then rolled over onto her left side and she is not sure if she hit her head.  She felt pain immediately after.  She lives with her son and daughter-in-law.  Son had to help her get up as patient could not get up on her own.  She waited through the night and part of today to see if the pain would get any better.  The pain continued to get worse.  Patient reports that upon arrival to the ED her pain was 10 out of 10.  After the fentanyl she was given in the ED of pain is 3 out of 10.  She had a doctor's appointment with her primary care physician today, but just could not make it there because she was in too much pain.  Patient denies any other symptoms at this time including but not limited to: Blurry vision, nasal congestion, sore throat, nausea, vomiting, diarrhea, constipation, dizziness, syncope, and more.  Patient does report that she has a headache, but that it feels like her normal headache.    PT Comments    Patient demonstrates increased endurance for taking steps at bedside during transfers to [facility] and chair with slow labored movement requiring support at sides along with use of RW to  limit bodyweight on BLE due to c/o increasing bilateral hip pain with weightbearing.   Patient demonstrates good return for completing heel slides, but c/o increased pain in left hip due to old surgery.  Patient tolerated sitting up in chair after therapy with BLE elevated.  Patient will benefit from continued physical therapy in hospital and recommended venue below to increase strength, balance, endurance for safe ADLs and gait.   Follow Up Recommendations  SNF     Equipment Recommendations  None recommended by PT    Recommendations for Other Services       Precautions / Restrictions Precautions Precautions: Fall Precaution Comments: R hip fracture, prior L hip fracture. Lt rib pain Restrictions Weight Bearing Restrictions: Yes RLE Weight Bearing: Weight bearing as tolerated    Mobility  Bed Mobility Overal bed mobility: Needs Assistance Bed Mobility: Supine to Sit     Supine to sit: Min guard     General bed mobility comments: increased time, labored movement  Transfers Overall transfer level: Needs assistance Equipment used: Rolling walker (2 wheeled) Transfers: Sit to/from [employer] Sit to Stand: Min assist;Mod assist Stand pivot transfers: Mod assist       General transfer comment: increased time, labored movement, unsteady on feet due to bilateral hip pain  Ambulation/Gait Ambulation/Gait assistance: Mod assist;Max assist Gait Distance (Feet): 5 Feet Assistive device: Rolling walker (2 wheeled)  Gait Pattern/deviations: Decreased step length - right;Decreased step length - left;Decreased stance time - right;Decreased stance time - left;Decreased stride length;Antalgic Gait velocity: decreased   General Gait Details: limited to 5-6 slow labored unsteady steps at bedside with poor tolerance for prolonged weightbearing on BLE due to bilateral hip pain   Stairs             Wheelchair Mobility    Modified Rankin (Stroke Patients Only)        Balance Overall balance assessment: Needs assistance Sitting-balance support: Feet supported;No upper extremity supported Sitting balance-Leahy Scale: Good Sitting balance - Comments: seated EOB   Standing balance support: During functional activity;Bilateral upper extremity supported Standing balance-Leahy Scale: Fair Standing balance comment: using RW                            Cognition Arousal/Alertness: Awake/alert Behavior During Therapy: WFL for tasks assessed/performed Overall Cognitive Status: Within Functional Limits for tasks assessed                                        Exercises General Exercises - Lower Extremity Ankle Circles/Pumps: Supine;10 reps;Strengthening;Both;AROM Heel Slides: Supine;10 reps;Both;Strengthening;AROM    General Comments        Pertinent Vitals/Pain Pain Assessment: 0-10 Pain Score: 4  Pain Location: bilateral hips, mostly left hip Pain Descriptors / Indicators: Sore Pain Intervention(s): Limited activity within patient's tolerance;Monitored during session;Premedicated before session;Repositioned    Home Living                      Prior Function            PT Goals (current goals can now be found in the care plan section) Acute Rehab PT Goals Patient Stated Goal: get stronger and return home PT Goal Formulation: With patient Time For Goal Achievement: 11/12/19 Potential to Achieve Goals: Good Progress towards PT goals: Progressing toward goals    Frequency    Min 4X/week      PT Plan Current plan remains appropriate    Co-evaluation              AM-PAC PT "6 Clicks" Mobility   Outcome Measure  Help needed turning from your back to your side while in a flat bed without using bedrails?: None Help needed moving from lying on your back to sitting on the side of a flat bed without using bedrails?: A Little Help needed moving to and from a bed to a chair (including a  wheelchair)?: A Lot Help needed standing up from a chair using your arms (e.g., wheelchair or bedside chair)?: A Lot Help needed to walk in hospital room?: A Lot Help needed climbing 3-5 steps with a railing? : Total 6 Click Score: 14    End of Session   Activity Tolerance: Patient tolerated treatment well;Patient limited by fatigue;Patient limited by pain Patient left: in chair;with call bell/phone within reach Nurse Communication: Mobility status PT Visit Diagnosis: Unsteadiness on feet (R26.81);Other abnormalities of gait and mobility (R26.89);Muscle weakness (generalized) (M62.81);Repeated falls (R29.6);Pain Pain - part of body: Hip(bilateral)     Time: 4944-9675 PT Time Calculation (min) (ACUTE ONLY): 32 min  Charges:  $Therapeutic Exercise: 8-22 mins $Therapeutic Activity: 8-22 mins  2:06 PM, 10/30/19 Lonell Grandchild, MPT Physical Therapist with [facility] [phone] office [phone] mobile phone

## 2019-10-31 DIAGNOSIS — G2581 Restless legs syndrome: Secondary | ICD-10-CM

## 2019-10-31 DIAGNOSIS — J449 Chronic obstructive pulmonary disease, unspecified: Secondary | ICD-10-CM

## 2019-10-31 DIAGNOSIS — J9611 Chronic respiratory failure with hypoxia: Secondary | ICD-10-CM

## 2019-10-31 DIAGNOSIS — K219 Gastro-esophageal reflux disease without esophagitis: Secondary | ICD-10-CM

## 2019-10-31 DIAGNOSIS — F419 Anxiety disorder, unspecified: Secondary | ICD-10-CM

## 2019-10-31 DIAGNOSIS — I10 Essential (primary) hypertension: Secondary | ICD-10-CM

## 2019-10-31 LAB — RESPIRATORY PANEL BY RT PCR (FLU A&B, COVID)
Influenza A by PCR: NEGATIVE
Influenza B by PCR: NEGATIVE
SARS Coronavirus 2 by RT PCR: NEGATIVE

## 2019-10-31 MED ORDER — ACETAMINOPHEN 500 MG PO TABS: 1000.0000 mg | ORAL_TABLET | Freq: Three times a day (TID) | ORAL | 0 refills | Status: DC

## 2019-10-31 MED ORDER — FERROUS SULFATE 325 (65 FE) MG PO TABS: 325.0000 mg | ORAL_TABLET | Freq: Every day | ORAL | 0 refills | Status: DC

## 2019-10-31 MED ORDER — OXYCODONE HCL 5 MG PO TABS: 5.0000 mg | ORAL_TABLET | Freq: Four times a day (QID) | ORAL | 0 refills | Status: AC | PRN

## 2019-10-31 MED ORDER — BISACODYL 10 MG RE SUPP: 10.0000 mg | RECTAL | 0 refills | Status: DC | PRN

## 2019-10-31 MED ORDER — CLONAZEPAM 0.5 MG PO TABS: 0.2500 mg | ORAL_TABLET | Freq: Two times a day (BID) | ORAL | 0 refills | Status: DC | PRN

## 2019-10-31 MED ORDER — MAGNESIUM HYDROXIDE 400 MG/5ML PO SUSP: 30.0000 mL | Freq: Every day | ORAL | 0 refills | Status: DC | PRN

## 2019-10-31 NOTE — TOC Progression Note (Signed)
Transition of Care [facility]) - Progression Note    Patient Details  Name: Sharon Jordan MRN: [id number] Date of Birth: 06-22-1946  [facility] CM/SW Contact  Maverick Patman Sherryle Lis, LCSW Phone Number: 10/31/2019, 10:49 AM  Clinical Narrative:   Patient is currently ready for discharge. CSW attempting to contact Thayer Ohm, care coordinator for [facility] regarding patient bed offer. CSW left VM requesting call back. TOC team will continue to follow patient for discharge related needs   Jamarius Saha Tomma Rakers Transitions of Care  Clinical Social Worker  Ph: [phone]    Expected Discharge Plan: Skilled Nursing Facility Barriers to Discharge: Continued Medical Work up  Expected Discharge Plan and Services Expected Discharge Plan: Skilled Nursing Facility In-house Referral: Clinical Social Work   Post Acute Care Choice: Skilled Nursing Facility Living arrangements for the past 2 months: Single Family Home                                       Social Determinants of Health (SDOH) Interventions    Readmission Risk Interventions Readmission Risk Prevention Plan 10/28/2019  Transportation Screening Complete  Home Care Screening Complete  Medication Review (RN CM) Complete

## 2019-10-31 NOTE — Discharge Summary (Signed)
Physician Discharge Summary  Sharon Jordan ZOX:[id number] DOB: 10/19/1945 DOA: 10/27/2019  PCP: Oval Linseyondiego, Richard, MD  Admit date: 10/27/2019 Discharge date: 10/31/2019  Time spent: 35 minutes  Recommendations for Outpatient Follow-up:  1. Repeat basic metabolic panel to evaluate lites and renal function 2. Reassess blood pressure and further adjust antihypertensive regimen as needed 3. Make sure patient has follow-up with orthopedic service for further evaluation and management of her closed right hip fracture.   Discharge Diagnoses:  Active Problems:   RLS (restless legs syndrome)   Hip fracture (HCC)   Chronic respiratory failure with hypoxia (HCC)   Anxiety COPD Gastroesophageal reflux disease Essential hypertension  Discharge Condition: Stable and improved.  Patient will be discharging to skilled nursing facility for further care and rehabilitation.  CODE STATUS: Full code  Diet recommendation: Heart healthy diet.  Filed Weights   10/27/19 1059 10/27/19 2047  Weight: 56.7 kg 55.4 kg    History of present illness:  As per H&P written by Dr. Sheliah PlaneZierle-Gosh on 10/27/19 74 y.o. female, with history of restless leg syndrome, hypertension, depression, COPD, and previous fractures presents to the ER with a chief complaint of right hip pain.  Patient reports that yesterday she was preparing to sit down on the couch when she tried to walk around an end table and brace on it.  Patient reports that she was not feeling woozy, having chest pain, having shortness of breath, or any other reason to be bracing on this and table, that is just how she habitually gets onto the couch.  The end table buckled beneath her weight and both she and the end table fell down.  Patient reports that she fell onto her right hip and then rolled over onto her left side and she is not sure if she hit her head.  She felt pain immediately after.  She lives with her son and daughter-in-law.  Son had to help her get  up as patient could not get up on her own.  She waited through the night and part of today to see if the pain would get any better.  The pain continued to get worse.  Patient reports that upon arrival to the ED her pain was 10 out of 10.  After the fentanyl she was given in the ED of pain is 3 out of 10.  She had a doctor's appointment with her primary care physician today, but just could not make it there because she was in too much pain.  Patient denies any other symptoms at this time including but not limited to: Blurry vision, nasal congestion, sore throat, nausea, vomiting, diarrhea, constipation, dizziness, syncope, and more.  Patient does report that she has a headache, but that it feels like her normal headache.  In the ED Temperature 98.4, pulse 96, respiratory rate 16, blood pressure 183/95, O2 sats 100%.  Afebrile and no leukocytosis.  Patient has a white blood cell count of 7.7.  CHEM panel reveals a hyperkalemia with a potassium of 5.4.  Covid test is negative.  CT hip shows displaced fracture of right greater trochanter.  X-ray ribs shows no acute rib fracture demonstrable.  Lungs clear.  No pneumothorax.  CT head shows no acute process.  Fentanyl ordered for pain.  Ortho consulted from the ED and reportedly advised that this will likely be a nonsurgical intervention with physical therapy only.  Ortho to see patient in the a.m.  Hospital Course:  Nondisplaced fracture of right greater trochanter status post mechanical fall -  Appreciate orthopedic evaluation with plans to follow-up in approximately 2 weeks for reimaging -Continue current pain management -Discharged on DVT prophylaxis with aspirin as currently prescribed at 81 mg twice daily -Appreciate PT evaluation with recommendations for SNF/rehab, CSW working on placement  Hyperkalemia -Resolved  -Continue intermittent evaluation of basic metabolic panel to follow potassium and electrolytes stability.  Iron deficiency  anemia -Continue ferrous sulfate -Follow CBC intermittently to assess hemoglobin trend. -No signs of overt bleeding appreciated.  Hypertension-stable -Continue amlodipine -Heart healthy diet encouraged.  Restless leg syndrome -Continue gabapentin and Requip  Depression -Continue bupropion  GERD -Continue PPI  Chronic respiratory failure with hypoxia: In the setting of COPD -No shortness of breath or wheezing on exam -Continue home bronchodilators regimen -Continue chronic oxygen supplementation (patient uses 2-2.5 L at nighttime every night; and then on as-needed basis throughout the day mainly on exertion).  Procedures:  See below for x-ray reports.  Consultations:  Orthopedic service.  Discharge Exam: Vitals:   10/31/19 0744 10/31/19 1423  BP:  (!) 122/52  Pulse:  (!) 110  Resp:  16  Temp:  98.2 F (36.8 C)  SpO2: 98% 93%   General exam: Alert, awake, oriented x 2 (missed year) uncooperative with examination.  Continue complaining intermittently of right hip pain and also pain of her left costal area. No nausea, no SOB, no vomiting. Respiratory system: Clear to auscultation. Respiratory effort normal.  Good oxygen saturation on chronic 2 L supplementation. Cardiovascular system:RRR. No murmurs, rubs, gallops. Gastrointestinal system: Abdomen is nondistended, soft and nontender. No organomegaly or masses felt. Normal bowel sounds heard. Central nervous system: Alert and oriented. No focal neurological deficits. Extremities: No cyanosis or clubbing. Skin: No rashes, petechiae. Psychiatry: Mood & affect appropriate.    Discharge Instructions   Discharge Instructions    Diet - low sodium heart healthy   Complete by: As directed    Discharge instructions   Complete by: As directed    Weightbearing as tolerated Take medications as prescribed Maintain adequate hydration Outpatient follow-up with Dr. Aline Brochure as instructed Follow heart healthy  diet. Physical therapy rehabilitation as per the skilled nursing facility protocol.     Allergies as of 10/31/2019      Reactions   Penicillins Diarrhea   Patient states she is allergic to 'all antibiotics' states has diarrhea and nausea.      Medication List    TAKE these medications   acetaminophen 500 MG tablet Commonly known as: TYLENOL Take 2 tablets (1,000 mg total) by mouth 3 (three) times daily. What changed:   when to take this  reasons to take this   albuterol (2.5 MG/3ML) 0.083% nebulizer solution Commonly known as: PROVENTIL Take 3 mLs (2.5 mg total) by nebulization every 8 (eight) hours as needed for wheezing or shortness of breath.   amLODipine 5 MG tablet Commonly known as: NORVASC Take 1 tablet (5 mg total) by mouth daily.   Artificial Tears 1 % ophthalmic solution Generic drug: carboxymethylcellulose Place 2 drops into both eyes 3 (three) times daily. Instill 2 drops both eyes three times a day as needed for dry eyes   aspirin EC 81 MG tablet Take 81 mg by mouth 2 (two) times daily. QAM and QHS   bisacodyl 10 MG suppository Commonly known as: DULCOLAX Place 1 suppository (10 mg total) rectally as needed for moderate constipation (Constopation not relieved by MOM).   Breztri Aerosphere 160-9-4.8 MCG/ACT Aero Generic drug: Budeson-Glycopyrrol-Formoterol Inhale 1 puff into the lungs daily.  buPROPion 300 MG 24 hr tablet Commonly known as: WELLBUTRIN XL Take 1 tablet (300 mg total) by mouth daily.   calcium carbonate 500 MG chewable tablet Commonly known as: TUMS - dosed in mg elemental calcium Chew 1 tablet by mouth 3 (three) times daily with meals.   clonazePAM 0.5 MG tablet Commonly known as: KLONOPIN Take 0.5 tablets (0.25 mg total) by mouth every 12 (twelve) hours as needed for anxiety. What changed: when to take this   docusate sodium 100 MG capsule Commonly known as: COLACE Take 100 mg by mouth daily.   ferrous sulfate 325 (65 FE) MG  tablet Take 1 tablet (325 mg total) by mouth daily with breakfast. For Anemia   gabapentin 400 MG capsule Commonly known as: NEURONTIN Take 1 capsule (400 mg total) by mouth 3 (three) times daily.   guaiFENesin 600 MG 12 hr tablet Commonly known as: MUCINEX Take 2 tablets (1,200 mg total) by mouth 2 (two) times daily.   magnesium hydroxide 400 MG/5ML suspension Commonly known as: MILK OF MAGNESIA Take 30 mLs by mouth daily as needed for moderate constipation. What changed: reasons to take this   melatonin 3 MG Tabs tablet Take 3 mg by mouth at bedtime.   METAMUCIL PO Take by mouth See admin instructions. TAKE 1 SCOOP DAILY AT BEDTIME  FOR CONSTIPATION- HOLD FOR DIARRHEA   methocarbamol 500 MG tablet Commonly known as: ROBAXIN Take 1 tablet (500 mg total) by mouth 2 (two) times daily as needed for muscle spasms.   ondansetron 4 MG tablet Commonly known as: ZOFRAN Take 1 tablet (4 mg total) by mouth every 6 (six) hours as needed for nausea or vomiting.   oxyCODONE 5 MG immediate release tablet Commonly known as: Oxy IR/ROXICODONE Take 1 tablet (5 mg total) by mouth every 6 (six) hours as needed for up to 5 days for severe pain.   pantoprazole 40 MG tablet Commonly known as: PROTONIX Take 1 tablet (40 mg total) by mouth daily.   rOPINIRole 2 MG tablet Commonly known as: REQUIP Take 1 tablet (2 mg total) by mouth at bedtime. What changed: how much to take   simethicone 80 MG chewable tablet Commonly known as: MYLICON Chew 1 tablet (80 mg total) by mouth 3 (three) times daily as needed.   Vitamin D-3 125 MCG (5000 UT) Tabs Give 5,000 units by mouth daily What changed:   how much to take  how to take this  when to take this  additional instructions      Allergies  Allergen Reactions  . Penicillins Diarrhea    Patient states she is allergic to 'all antibiotics' states has diarrhea and nausea.   Follow-up Information    Vickki Hearing, MD. Schedule an  appointment as soon as possible for a visit in 2 week(s).   Specialties: Orthopedic Surgery, Radiology Contact information: [address] [place] [address] [phone]        [name], MD. Schedule an appointment as soon as possible for a visit in 2 week(s).   Specialty: Internal Medicine Why: after discharge from SNF Contact information: [address] [place] [address] [phone]           The results of significant diagnostics from this hospitalization (including imaging, microbiology, ancillary and laboratory) are listed below for reference.    Significant Diagnostic Studies: DG Ribs Unilateral W/Chest Left  Result Date: 10/27/2019 CLINICAL DATA:  Pain following fall EXAM: LEFT RIBS AND CHEST - 3+ VIEW  COMPARISON:  None. FINDINGS: Frontal chest as well as oblique and cone-down rib images obtained. No edema or airspace opacity. Heart size and pulmonary vascularity are normal. No adenopathy. There is no evident pneumothorax or pleural effusion. No evident rib fracture. There is an old healed fracture of the left humeral metaphysis. IMPRESSION: No acute rib fracture is demonstrable. Lungs clear. No pneumothorax. Healed fracture proximal left humerus with remodeling. Electronically Signed   By: Bretta Bang III M.D.   On: 10/27/2019 13:03   CT Head Wo Contrast  Result Date: 10/27/2019 CLINICAL DATA:  Patient status post fall. EXAM: CT HEAD WITHOUT CONTRAST TECHNIQUE: Contiguous axial images were obtained from the base of the skull through the vertex without intravenous contrast. COMPARISON:  Brain CT 08/23/2019 FINDINGS: Brain: Ventricles and sulci are appropriate for patient's age. Periventricular and subcortical white matter hypodensities compatible with chronic microvascular ischemic changes. No evidence for acute cortically based infarct, intracranial hemorrhage, mass lesion or mass-effect. Vascular: No hyperdense vessel or unexpected  calcification. Skull: Normal. Negative for fracture or focal lesion. Sinuses/Orbits: No acute finding. Other: None. IMPRESSION: No acute intracranial process. Electronically Signed   By: Annia Belt M.D.   On: 10/27/2019 12:29   CT Hip Right Wo Contrast  Result Date: 10/27/2019 CLINICAL DATA:  Right hip pain after a fall.  Initial encounter. EXAM: CT OF THE RIGHT HIP WITHOUT CONTRAST TECHNIQUE: Multidetector CT imaging of the right hip was performed according to the standard protocol. Multiplanar CT image reconstructions were also generated. COMPARISON:  Plain films right hip earlier today. FINDINGS: Bones/Joint/Cartilage Bones are osteopenic. The patient has an acute fracture through the base of the greater trochanter. The greater trochanter demonstrates medial displacement of approximately 0.5 cm and mild superior displacement. No extension of the fracture into the intertrochanteric femur is identified. There is buckling of the anterior cortex of the right sacrum consistent with fracture, likely remote. Remote left pubic bone and inferior pubic ramus fractures are partially imaged. No lytic or sclerotic lesion is identified. The right femoral head is located. Mild right hip joint space narrowing is noted. Ligaments Suboptimally assessed by CT. Muscles and Tendons Negative. Soft tissues Imaged intrapelvic contents demonstrate sigmoid diverticulosis. The patient is status post hysterectomy. IMPRESSION: Mildly displaced fracture of the right greater trochanter. No intertrochanteric extension is identified but osteopenia limits evaluation. If there is concern for intertrochanteric extension, MRI without contrast is recommended for further evaluation. Right sacral fracture appears remote. Remote left parasymphyseal and inferior pubic ramus fractures are partially visualized. Electronically Signed   By: Drusilla Kanner M.D.   On: 10/27/2019 15:23   DG Hip Unilat W or Wo Pelvis 2-3 Views Right  Result Date:  10/27/2019 CLINICAL DATA:  Pain following fall EXAM: DG HIP (WITH OR WITHOUT PELVIS) 2-3V RIGHT COMPARISON:  August 23, 2019 radiographs and CT of the left hip region. FINDINGS: Frontal pelvis as well as frontal and lateral right hip images were obtained. Postoperative changes noted in the left proximal femur with prior intertrochanteric fracture on the left in essentially anatomic alignment. Healing fracture left ischium noted. There is underlying osteoporosis. No acute fracture or dislocation. There is mild symmetric narrowing of each hip joint. IMPRESSION: Postoperative change left hip joint. Healing fracture left ischium. Underlying osteoporosis. No acute fracture or dislocation. Symmetric narrowing each hip joint, stable. Electronically Signed   By: Bretta Bang III M.D.   On: 10/27/2019 13:01    Microbiology: Recent Results (from the past 240 hour(s))  Respiratory Panel by RT  PCR (Flu A&B, Covid) - Nasopharyngeal Swab     Status: None   Collection Time: 10/27/19  3:32 PM   Specimen: Nasopharyngeal Swab  Result Value Ref Range Status   SARS Coronavirus 2 by RT PCR NEGATIVE NEGATIVE Final    Comment: (NOTE) SARS-CoV-2 target nucleic acids are NOT DETECTED. The SARS-CoV-2 RNA is generally detectable in upper respiratoy specimens during the acute phase of infection. The lowest concentration of SARS-CoV-2 viral copies this assay can detect is 131 copies/mL. A negative result does not preclude SARS-Cov-2 infection and should not be used as the sole basis for treatment or other patient management decisions. A negative result may occur with  improper specimen collection/handling, submission of specimen other than nasopharyngeal swab, presence of viral mutation(s) within the areas targeted by this assay, and inadequate number of viral copies (<131 copies/mL). A negative result must be combined with clinical observations, patient history, and epidemiological information. The expected result  is Negative. Fact Sheet for Patients:  https://www.moore.com/ Fact Sheet for Healthcare Providers:  https://www.young.biz/ This test is not yet ap proved or cleared by the [place] FDA and  has been authorized for detection and/or diagnosis of SARS-CoV-2 by FDA under an Emergency Use Authorization (EUA). This EUA will remain  in effect (meaning this test can be used) for the duration of the COVID-19 declaration under Section 564(b)(1) of the Act, 21 U.S.C. section 360bbb-3(b)(1), unless the authorization is terminated or revoked sooner.    Influenza A by PCR NEGATIVE NEGATIVE Final   Influenza B by PCR NEGATIVE NEGATIVE Final    Comment: (NOTE) The Xpert Xpress SARS-CoV-2/FLU/RSV assay is intended as an aid in  the diagnosis of influenza from Nasopharyngeal swab specimens and  should not be used as a sole basis for treatment. Nasal washings and  aspirates are unacceptable for Xpert Xpress SARS-CoV-2/FLU/RSV  testing. Fact Sheet for Patients: https://www.moore.com/ Fact Sheet for Healthcare Providers: https://www.young.biz/ This test is not yet approved or cleared by the [place] FDA and  has been authorized for detection and/or diagnosis of SARS-CoV-2 by  FDA under an Emergency Use Authorization (EUA). This EUA will remain  in effect (meaning this test can be used) for the duration of the  Covid-19 declaration under Section 564(b)(1) of the Act, 21  U.S.C. section 360bbb-3(b)(1), unless the authorization is  terminated or revoked. Performed at [facility], [address] [place], [place] [address]      Labs: Basic Metabolic Panel: Recent Labs  Lab 10/27/19 1123 10/28/19 0444 10/29/19 0440  NA 136 136 135  K 5.4* 4.7 4.6  CL 95* 97* 95*  CO2 34* 33* 33*  GLUCOSE 94 89 104*  BUN 17 17 13   CREATININE 0.60 0.57 0.50  CALCIUM 9.1 8.8* 8.5*  MG  --  1.9 1.7   Liver Function  Tests: Recent Labs  Lab 10/28/19 0444  AST 11*  ALT 10  ALKPHOS 74  BILITOT 0.4  PROT 5.3*  ALBUMIN 3.1*   CBC: Recent Labs  Lab 10/27/19 1123 10/28/19 0444 10/29/19 0440  WBC 7.7 6.4 6.0  NEUTROABS 6.0 4.8  --   HGB 9.4* 8.8* 8.4*  HCT 32.3* 29.9* 29.3*  MCV 93.1 94.0 94.2  PLT 249 227 227    Signed:  10/31/19 MD.  [facility] 10/31/2019, 2:35 PM

## 2019-10-31 NOTE — Plan of Care (Signed)
  Problem: Activity: Goal: Risk for activity intolerance will decrease Outcome: Progressing   Problem: Pain Managment: Goal: General experience of comfort will improve Outcome: Progressing   Problem: Safety: Goal: Ability to remain free from injury will improve Outcome: Progressing   

## 2019-10-31 NOTE — Progress Notes (Signed)
CSW in contact with [name] from [facility] regarding patient prescriptions being sent over prior to being patient being discharged so that medications can be filled prior to patients arrival to [facility].   CSW faxed over hard scripts to Fax# [phone]. Hard scipts will also be included in discharge packet to transfer with patient.   Patient will discharge to [facility] via [facility]. Medical transport necessity form completed by CSW.   No further needs at this time  Charyl Minervini Tomma Rakers Transitions of Care  Clinical Social Worker  Ph: [phone]

## 2019-10-31 NOTE — Progress Notes (Signed)
Nsg Discharge Note  Admit Date:  10/27/2019 Discharge date: 10/31/2019   Lavine Hargrove to be D/C'd to [facility] per MD order.  AVS completed.  Copy for chart, and copy for [facility] signed, and dated. Patient/caregiver able to verbalize understanding.  Discharge Medication: Allergies as of 10/31/2019      Reactions   Penicillins Diarrhea   Patient states she is allergic to 'all antibiotics' states has diarrhea and nausea.      Medication List    TAKE these medications   acetaminophen 500 MG tablet Commonly known as: TYLENOL Take 2 tablets (1,000 mg total) by mouth 3 (three) times daily. What changed:   when to take this  reasons to take this   albuterol (2.5 MG/3ML) 0.083% nebulizer solution Commonly known as: PROVENTIL Take 3 mLs (2.5 mg total) by nebulization every 8 (eight) hours as needed for wheezing or shortness of breath.   amLODipine 5 MG tablet Commonly known as: NORVASC Take 1 tablet (5 mg total) by mouth daily.   Artificial Tears 1 % ophthalmic solution Generic drug: carboxymethylcellulose Place 2 drops into both eyes 3 (three) times daily. Instill 2 drops both eyes three times a day as needed for dry eyes   aspirin EC 81 MG tablet Take 81 mg by mouth 2 (two) times daily. QAM and QHS   bisacodyl 10 MG suppository Commonly known as: DULCOLAX Place 1 suppository (10 mg total) rectally as needed for moderate constipation (Constopation not relieved by MOM).   Breztri Aerosphere 160-9-4.8 MCG/ACT Aero Generic drug: Budeson-Glycopyrrol-Formoterol Inhale 1 puff into the lungs daily.   buPROPion 300 MG 24 hr tablet Commonly known as: WELLBUTRIN XL Take 1 tablet (300 mg total) by mouth daily.   calcium carbonate 500 MG chewable tablet Commonly known as: TUMS - dosed in mg elemental calcium Chew 1 tablet by mouth 3 (three) times daily with meals.   clonazePAM 0.5 MG tablet Commonly known as: KLONOPIN Take 0.5 tablets (0.25 mg  total) by mouth every 12 (twelve) hours as needed for anxiety. What changed: when to take this   docusate sodium 100 MG capsule Commonly known as: COLACE Take 100 mg by mouth daily.   ferrous sulfate 325 (65 FE) MG tablet Take 1 tablet (325 mg total) by mouth daily with breakfast. For Anemia   gabapentin 400 MG capsule Commonly known as: NEURONTIN Take 1 capsule (400 mg total) by mouth 3 (three) times daily.   guaiFENesin 600 MG 12 hr tablet Commonly known as: MUCINEX Take 2 tablets (1,200 mg total) by mouth 2 (two) times daily.   magnesium hydroxide 400 MG/5ML suspension Commonly known as: MILK OF MAGNESIA Take 30 mLs by mouth daily as needed for moderate constipation. What changed: reasons to take this   melatonin 3 MG Tabs tablet Take 3 mg by mouth at bedtime.   METAMUCIL PO Take by mouth See admin instructions. TAKE 1 SCOOP DAILY AT BEDTIME  FOR CONSTIPATION- HOLD FOR DIARRHEA   methocarbamol 500 MG tablet Commonly known as: ROBAXIN Take 1 tablet (500 mg total) by mouth 2 (two) times daily as needed for muscle spasms.   ondansetron 4 MG tablet Commonly known as: ZOFRAN Take 1 tablet (4 mg total) by mouth every 6 (six) hours as needed for nausea or vomiting.   oxyCODONE 5 MG immediate release tablet Commonly known as: Oxy IR/ROXICODONE Take 1 tablet (5 mg total) by mouth every 6 (six) hours as needed for up to 5 days for severe  pain.   pantoprazole 40 MG tablet Commonly known as: PROTONIX Take 1 tablet (40 mg total) by mouth daily.   rOPINIRole 2 MG tablet Commonly known as: REQUIP Take 1 tablet (2 mg total) by mouth at bedtime. What changed: how much to take   simethicone 80 MG chewable tablet Commonly known as: MYLICON Chew 1 tablet (80 mg total) by mouth 3 (three) times daily as needed.   Vitamin D-3 125 MCG (5000 UT) Tabs Give 5,000 units by mouth daily What changed:   how much to take  how to take this  when to take this  additional  instructions       Discharge Assessment: Vitals:   10/31/19 0744 10/31/19 1423  BP:  (!) 122/52  Pulse:  (!) 110  Resp:  16  Temp:  98.2 F (36.8 C)  SpO2: 98% 93%   Skin clean, dry and intact without evidence of skin break down, no evidence of skin tears noted. IV catheter discontinued intact. Site without signs and symptoms of complications - no redness or edema noted at insertion site, patient denies c/o pain - only slight tenderness at site.  Dressing with slight pressure applied.  D/c Instructions-Education: Discharge instructions given to patient/family with verbalized understanding. D/c education completed with patient/family including follow up instructions, medication list, d/c activities limitations if indicated, with other d/c instructions as indicated by MD - patient able to verbalize understanding, all questions fully answered. Patient instructed to return to ED, call 911, or call MD for any changes in condition.  Patient escorted via EMS to [facility]  Carole Civil, RN 10/31/2019 5:00 PM

## 2019-10-31 NOTE — Progress Notes (Signed)
CSW in contact with [facility] from [facility]: [phone] regarding patient admission. [name] informed CSW that the 3 night inpatient stay medicare waiver was no longer in effect. CSW informed Gala Romney that patient was admitted into inpatient status on the 29th of March.   Awaiting call back from [facility] to see if [facility] can accept her as early as today.   Sharon Jordan LCSWA Transitions of Care  Clinical Social Worker  Ph: [phone]

## 2019-10-31 NOTE — Progress Notes (Signed)
PT Cancellation Note  Patient Details Name: Sharon Jordan MRN: [id number] DOB: 22-Jun-1946   Cancelled Treatment:    Reason Eval/Treat Not Completed: Pain limiting ability to participate;Other (comment)(Patient feeling ill with nausea) Treatment attempted this morning. Patient was limited by pain and nausea. Will check back later if time permits.  9:40 AM, 10/31/19 Wyman Songster PT, DPT Physical Therapist at [facility]

## 2019-10-31 NOTE — Care Management Important Message (Signed)
Important Message  Patient Details  Name: Sharon Jordan MRN: [id number] Date of Birth: November 14, 1945   Medicare Important Message Given:  Yes     Corey Harold 10/31/2019, 11:47 AM

## 2019-11-13 DIAGNOSIS — S72101D Unspecified trochanteric fracture of right femur, subsequent encounter for closed fracture with routine healing: Secondary | ICD-10-CM | POA: Insufficient documentation

## 2019-11-17 ENCOUNTER — Encounter: Payer: Self-pay | Admitting: Orthopedic Surgery

## 2019-11-17 ENCOUNTER — Ambulatory Visit (INDEPENDENT_AMBULATORY_CARE_PROVIDER_SITE_OTHER): Payer: Medicare Other | Admitting: Orthopedic Surgery

## 2019-11-17 ENCOUNTER — Other Ambulatory Visit: Payer: Self-pay

## 2019-11-17 ENCOUNTER — Ambulatory Visit: Payer: Medicare Other

## 2019-11-17 DIAGNOSIS — S72101D Unspecified trochanteric fracture of right femur, subsequent encounter for closed fracture with routine healing: Secondary | ICD-10-CM

## 2019-11-17 NOTE — Progress Notes (Signed)
No chief complaint on file.   Encounter Diagnosis  Name Primary?  . Closed trochanteric fracture of right femur with routine healing 10/28/19 Yes    Greater trochanteric fracture right hip treated closed  She is now 19 days post injury  She also had a left hip fracture which failed and was revised with a blade plate down in [place] in January.  She is moving that leg well has good strength normal alignment x-ray looks fine  The right hip avulsion fracture also looks fine she is a little weaker on that side but her hip flexion strength allows her 90 degrees plus of hip flexion  Recommend weight-bear as tolerated advance as tolerated and come back in 4 weeks for repeat x-ray  Her daughter will call the doctor in [place] call us back with the name and number and then I will call him and see if he will let her follow-up here for her x-rays on the left hip

## 2019-11-17 NOTE — Patient Instructions (Signed)
Advance therapy as tolerated

## 2019-12-15 ENCOUNTER — Ambulatory Visit: Payer: Medicare Other | Admitting: Orthopedic Surgery

## 2019-12-16 ENCOUNTER — Ambulatory Visit: Payer: Medicare Other

## 2019-12-16 ENCOUNTER — Other Ambulatory Visit: Payer: Self-pay

## 2019-12-16 ENCOUNTER — Ambulatory Visit (INDEPENDENT_AMBULATORY_CARE_PROVIDER_SITE_OTHER): Payer: Medicare Other | Admitting: Orthopedic Surgery

## 2019-12-16 ENCOUNTER — Telehealth: Payer: Self-pay | Admitting: Orthopedic Surgery

## 2019-12-16 ENCOUNTER — Encounter: Payer: Self-pay | Admitting: Orthopedic Surgery

## 2019-12-16 DIAGNOSIS — S72101D Unspecified trochanteric fracture of right femur, subsequent encounter for closed fracture with routine healing: Secondary | ICD-10-CM | POA: Diagnosis not present

## 2019-12-16 DIAGNOSIS — S72002D Fracture of unspecified part of neck of left femur, subsequent encounter for closed fracture with routine healing: Secondary | ICD-10-CM

## 2019-12-16 NOTE — Telephone Encounter (Signed)
Malorie from [facility] called stating she needed to know what kind of hip surgery this patient had  Please call her at [phone]  Thanks

## 2019-12-16 NOTE — Telephone Encounter (Addendum)
  Greater trochanteric fracture right hip treated closed   She also had a left hip fracture which failed and was revised with a blade plate down in [place] in January.    WBAT advance as tolerated and no precautions Called Mallorie to advise

## 2019-12-16 NOTE — Patient Instructions (Signed)
Possible stress fracture versus osteoarthritis left foot where hard soled shoe on the left foot for 8 weeks  Start therapy on the lower back for back pain at home for the next 4 weeks  X-ray left hip 8 weeks.

## 2019-12-16 NOTE — Telephone Encounter (Signed)
Called [facility] (long wait) ??....  But they did leave not with Dr Willow Ora s triage nurse

## 2019-12-16 NOTE — Progress Notes (Signed)
No chief complaint on file.  Last visit  Encounter Diagnosis  Name Primary?  . Closed trochanteric fracture of right femur with routine healing 10/28/19 Yes      Greater trochanteric fracture right hip treated closed   She is now 19 days post injury   She also had a left hip fracture which failed and was revised with a blade plate down in [place] in January.  She is moving that leg well has good strength normal alignment x-ray looks fine   The right hip avulsion fracture also looks fine she is a little weaker on that side but her hip flexion strength allows her 90 degrees plus of hip flexion  This visit:  Right greater trochanteric hip fracture post injury day number 48-week #7  Left hip hardware removal and internal fixation with a blade plate on August 25, 2019 at [facility] This is approaching 4 months  I was able to speak to Dr. Willow Ora at [facility] and through his nurse they agree that we could monitor the fracture on the left hip here in [place] complains of bilateral hip pain which is basically on the right and left sides of the lower back and she also has on review of systems pain in the left foot  Pain in the cervical spine on the left and trouble turning her neck Pain is isolated to the cervical spine and upper shoulder does not go into the deltoid or left arm and is not associated with any numbness or tingling  Right and left hip are both very mobile no tenderness in the hip itself pain and tenderness is on the right and left side of her lower back  X-rays of the pelvis and both hips  On the left side blade plate fixation left hip with good healing so far  On the right side greater troches fracture nondisplaced fibrous union  Patient then complained of pain in her left foot she is had about 2 weeks x-ray was taken when she was at the nursing home.  Pain is on the dorsum of the foot and plantar aspect of the foot it does feel better in the hard soled  shoe  Examination of the foot shows no swelling except over the metatarsal tarsal joint and on the plantar aspect of the foot there is tenderness as well.  There is some tenderness at the MTP joint  There is no malalignment or crepitance  Recommend hard soled shoe 8 weeks then we can get into further imaging if needed X-ray left hip in 2 months and then will have 6 months of x-rays on that hip and should be okay to stop there.  Dr. Willow Ora at [facility] is aware of my following the hip

## 2019-12-26 ENCOUNTER — Emergency Department (HOSPITAL_COMMUNITY): Payer: Medicare Other

## 2019-12-26 ENCOUNTER — Inpatient Hospital Stay (HOSPITAL_COMMUNITY)
Admission: EM | Admit: 2019-12-26 | Discharge: 2020-01-03 | DRG: 392 | Disposition: A | Discharge status: Skilled Nursing Facility | Payer: Medicare Other | Attending: Internal Medicine | Admitting: Internal Medicine

## 2019-12-26 ENCOUNTER — Encounter (HOSPITAL_COMMUNITY): Payer: Self-pay | Admitting: Emergency Medicine

## 2019-12-26 ENCOUNTER — Other Ambulatory Visit: Payer: Self-pay

## 2019-12-26 DIAGNOSIS — X58XXXD Exposure to other specified factors, subsequent encounter: Secondary | ICD-10-CM | POA: Diagnosis not present

## 2019-12-26 DIAGNOSIS — R443 Hallucinations, unspecified: Secondary | ICD-10-CM | POA: Diagnosis present

## 2019-12-26 DIAGNOSIS — K5900 Constipation, unspecified: Secondary | ICD-10-CM | POA: Diagnosis present

## 2019-12-26 DIAGNOSIS — K219 Gastro-esophageal reflux disease without esophagitis: Secondary | ICD-10-CM | POA: Diagnosis present

## 2019-12-26 DIAGNOSIS — J449 Chronic obstructive pulmonary disease, unspecified: Secondary | ICD-10-CM | POA: Diagnosis present

## 2019-12-26 DIAGNOSIS — S32402A Unspecified fracture of left acetabulum, initial encounter for closed fracture: Secondary | ICD-10-CM | POA: Diagnosis present

## 2019-12-26 DIAGNOSIS — F419 Anxiety disorder, unspecified: Secondary | ICD-10-CM | POA: Diagnosis present

## 2019-12-26 DIAGNOSIS — S72142A Displaced intertrochanteric fracture of left femur, initial encounter for closed fracture: Secondary | ICD-10-CM | POA: Diagnosis present

## 2019-12-26 DIAGNOSIS — K2970 Gastritis, unspecified, without bleeding: Secondary | ICD-10-CM | POA: Diagnosis present

## 2019-12-26 DIAGNOSIS — I700 Atherosclerosis of aorta: Secondary | ICD-10-CM | POA: Diagnosis present

## 2019-12-26 DIAGNOSIS — B961 Klebsiella pneumoniae [K. pneumoniae] as the cause of diseases classified elsewhere: Secondary | ICD-10-CM | POA: Diagnosis present

## 2019-12-26 DIAGNOSIS — M810 Age-related osteoporosis without current pathological fracture: Secondary | ICD-10-CM | POA: Diagnosis present

## 2019-12-26 DIAGNOSIS — I10 Essential (primary) hypertension: Secondary | ICD-10-CM | POA: Diagnosis present

## 2019-12-26 DIAGNOSIS — K449 Diaphragmatic hernia without obstruction or gangrene: Secondary | ICD-10-CM | POA: Diagnosis present

## 2019-12-26 DIAGNOSIS — K529 Noninfective gastroenteritis and colitis, unspecified: Principal | ICD-10-CM | POA: Diagnosis present

## 2019-12-26 DIAGNOSIS — E278 Other specified disorders of adrenal gland: Secondary | ICD-10-CM | POA: Diagnosis present

## 2019-12-26 DIAGNOSIS — D638 Anemia in other chronic diseases classified elsewhere: Secondary | ICD-10-CM | POA: Diagnosis present

## 2019-12-26 DIAGNOSIS — K5750 Diverticulosis of both small and large intestine without perforation or abscess without bleeding: Secondary | ICD-10-CM | POA: Diagnosis present

## 2019-12-26 DIAGNOSIS — Z87891 Personal history of nicotine dependence: Secondary | ICD-10-CM

## 2019-12-26 DIAGNOSIS — Z9049 Acquired absence of other specified parts of digestive tract: Secondary | ICD-10-CM

## 2019-12-26 DIAGNOSIS — F329 Major depressive disorder, single episode, unspecified: Secondary | ICD-10-CM | POA: Diagnosis present

## 2019-12-26 DIAGNOSIS — N390 Urinary tract infection, site not specified: Secondary | ICD-10-CM | POA: Diagnosis present

## 2019-12-26 DIAGNOSIS — J9610 Chronic respiratory failure, unspecified whether with hypoxia or hypercapnia: Secondary | ICD-10-CM | POA: Diagnosis present

## 2019-12-26 DIAGNOSIS — D649 Anemia, unspecified: Secondary | ICD-10-CM | POA: Diagnosis present

## 2019-12-26 DIAGNOSIS — Z9981 Dependence on supplemental oxygen: Secondary | ICD-10-CM

## 2019-12-26 DIAGNOSIS — Z20822 Contact with and (suspected) exposure to covid-19: Secondary | ICD-10-CM | POA: Diagnosis present

## 2019-12-26 DIAGNOSIS — Z7983 Long term (current) use of bisphosphonates: Secondary | ICD-10-CM

## 2019-12-26 DIAGNOSIS — Z1611 Resistance to penicillins: Secondary | ICD-10-CM | POA: Diagnosis present

## 2019-12-26 DIAGNOSIS — E871 Hypo-osmolality and hyponatremia: Secondary | ICD-10-CM | POA: Diagnosis present

## 2019-12-26 DIAGNOSIS — Z880 Allergy status to penicillin: Secondary | ICD-10-CM

## 2019-12-26 DIAGNOSIS — S72142D Displaced intertrochanteric fracture of left femur, subsequent encounter for closed fracture with routine healing: Secondary | ICD-10-CM | POA: Diagnosis not present

## 2019-12-26 DIAGNOSIS — F32A Depression, unspecified: Secondary | ICD-10-CM | POA: Diagnosis present

## 2019-12-26 DIAGNOSIS — G2581 Restless legs syndrome: Secondary | ICD-10-CM | POA: Diagnosis present

## 2019-12-26 DIAGNOSIS — Z90710 Acquired absence of both cervix and uterus: Secondary | ICD-10-CM

## 2019-12-26 DIAGNOSIS — Z7982 Long term (current) use of aspirin: Secondary | ICD-10-CM

## 2019-12-26 DIAGNOSIS — Z8781 Personal history of (healed) traumatic fracture: Secondary | ICD-10-CM

## 2019-12-26 DIAGNOSIS — Z79899 Other long term (current) drug therapy: Secondary | ICD-10-CM

## 2019-12-26 DIAGNOSIS — R8290 Unspecified abnormal findings in urine: Secondary | ICD-10-CM | POA: Diagnosis present

## 2019-12-26 LAB — SARS CORONAVIRUS 2 BY RT PCR (HOSPITAL ORDER, PERFORMED IN ~~LOC~~ HOSPITAL LAB): SARS Coronavirus 2: NEGATIVE

## 2019-12-26 LAB — CBC WITH DIFFERENTIAL/PLATELET
Abs Immature Granulocytes: 0.02 10*3/uL (ref 0.00–0.07)
Basophils Absolute: 0 10*3/uL (ref 0.0–0.1)
Basophils Relative: 1 %
Eosinophils Absolute: 0.1 10*3/uL (ref 0.0–0.5)
Eosinophils Relative: 3 %
HCT: 35.3 % — ABNORMAL LOW (ref 36.0–46.0)
Hemoglobin: 10.4 g/dL — ABNORMAL LOW (ref 12.0–15.0)
Immature Granulocytes: 0 %
Lymphocytes Relative: 12 %
Lymphs Abs: 0.6 10*3/uL — ABNORMAL LOW (ref 0.7–4.0)
MCH: 26.8 pg (ref 26.0–34.0)
MCHC: 29.5 g/dL — ABNORMAL LOW (ref 30.0–36.0)
MCV: 91 fL (ref 80.0–100.0)
Monocytes Absolute: 0.4 10*3/uL (ref 0.1–1.0)
Monocytes Relative: 7 %
Neutro Abs: 4 10*3/uL (ref 1.7–7.7)
Neutrophils Relative %: 77 %
Platelets: 256 10*3/uL (ref 150–400)
RBC: 3.88 MIL/uL (ref 3.87–5.11)
RDW: 16.1 % — ABNORMAL HIGH (ref 11.5–15.5)
WBC: 5.2 10*3/uL (ref 4.0–10.5)
nRBC: 0 % (ref 0.0–0.2)

## 2019-12-26 LAB — HEMOGLOBIN A1C
Hgb A1c MFr Bld: 5.3 % (ref 4.8–5.6)
Mean Plasma Glucose: 105.41 mg/dL

## 2019-12-26 LAB — COMPREHENSIVE METABOLIC PANEL
ALT: 11 U/L (ref 0–44)
AST: 13 U/L — ABNORMAL LOW (ref 15–41)
Albumin: 3.4 g/dL — ABNORMAL LOW (ref 3.5–5.0)
Alkaline Phosphatase: 74 U/L (ref 38–126)
Anion gap: 10 (ref 5–15)
BUN: 12 mg/dL (ref 8–23)
CO2: 33 mmol/L — ABNORMAL HIGH (ref 22–32)
Calcium: 8.7 mg/dL — ABNORMAL LOW (ref 8.9–10.3)
Chloride: 90 mmol/L — ABNORMAL LOW (ref 98–111)
Creatinine, Ser: 0.53 mg/dL (ref 0.44–1.00)
GFR calc Af Amer: >60 mL/min (ref >60)
GFR calc non Af Amer: >60 mL/min (ref >60)
Glucose, Bld: 102 mg/dL — ABNORMAL HIGH (ref 70–99)
Potassium: 3.4 mmol/L — ABNORMAL LOW (ref 3.5–5.1)
Sodium: 133 mmol/L — ABNORMAL LOW (ref 135–145)
Total Bilirubin: 0.7 mg/dL (ref 0.3–1.2)
Total Protein: 6 g/dL — ABNORMAL LOW (ref 6.5–8.1)

## 2019-12-26 LAB — URINALYSIS, ROUTINE W REFLEX MICROSCOPIC
Bilirubin Urine: NEGATIVE
Glucose, UA: NEGATIVE mg/dL
Hgb urine dipstick: NEGATIVE
Ketones, ur: 5 mg/dL — AB
Leukocytes,Ua: NEGATIVE
Nitrite: POSITIVE — AB
Protein, ur: NEGATIVE mg/dL
Specific Gravity, Urine: 1.011 (ref 1.005–1.030)
pH: 7 (ref 5.0–8.0)

## 2019-12-26 LAB — BRAIN NATRIURETIC PEPTIDE: B Natriuretic Peptide: 91 pg/mL (ref 0.0–100.0)

## 2019-12-26 LAB — TROPONIN I (HIGH SENSITIVITY)
Troponin I (High Sensitivity): 5 ng/L (ref <18)
Troponin I (High Sensitivity): 5 ng/L (ref <18)

## 2019-12-26 LAB — LACTIC ACID, PLASMA
Lactic Acid, Venous: 0.7 mmol/L (ref 0.5–1.9)
Lactic Acid, Venous: 0.5 mmol/L (ref 0.5–1.9)

## 2019-12-26 LAB — LIPASE, BLOOD: Lipase: 22 U/L (ref 11–51)

## 2019-12-26 LAB — CALCIUM: Calcium: 8.5 mg/dL — ABNORMAL LOW (ref 8.9–10.3)

## 2019-12-26 LAB — MAGNESIUM: Magnesium: 1.6 mg/dL — ABNORMAL LOW (ref 1.7–2.4)

## 2019-12-26 LAB — TSH: TSH: 1.621 u[IU]/mL (ref 0.350–4.500)

## 2019-12-26 LAB — PHOSPHORUS: Phosphorus: 3.3 mg/dL (ref 2.5–4.6)

## 2019-12-26 LAB — CBG MONITORING, ED: Glucose-Capillary: 89 mg/dL (ref 70–99)

## 2019-12-26 MED ORDER — SENNOSIDES-DOCUSATE SODIUM 8.6-50 MG PO TABS
1.0000 | ORAL_TABLET | Freq: Every evening | ORAL | Status: DC | PRN
  Filled 2019-12-26: qty 1

## 2019-12-26 MED ORDER — TRAZODONE HCL 50 MG PO TABS
50.0000 mg | ORAL_TABLET | Freq: Every evening | ORAL | Status: DC | PRN
  Administered 2020-01-01 – 2020-01-02 (×2): 50 mg via ORAL
  Filled 2019-12-26 (×2): qty 1

## 2019-12-26 MED ORDER — SODIUM CHLORIDE 0.9 % IV BOLUS
500.0000 mL | Freq: Once | INTRAVENOUS | Status: AC
  Administered 2019-12-26: 500 mL via INTRAVENOUS

## 2019-12-26 MED ORDER — SODIUM CHLORIDE 0.9 % IV BOLUS
1000.0000 mL | Freq: Once | INTRAVENOUS | Status: AC
  Administered 2019-12-26: 1000 mL via INTRAVENOUS

## 2019-12-26 MED ORDER — DEXTROSE-NACL 5-0.45 % IV SOLN: INTRAVENOUS | Status: DC

## 2019-12-26 MED ORDER — FENTANYL CITRATE (PF) 100 MCG/2ML IJ SOLN
25.0000 ug | Freq: Once | INTRAMUSCULAR | Status: AC
  Administered 2019-12-26: 25 ug via INTRAVENOUS
  Filled 2019-12-26: qty 2

## 2019-12-26 MED ORDER — ACETAMINOPHEN 650 MG RE SUPP: 650.0000 mg | Freq: Four times a day (QID) | RECTAL | Status: DC | PRN

## 2019-12-26 MED ORDER — RISAQUAD PO CAPS
2.0000 | ORAL_CAPSULE | Freq: Three times a day (TID) | ORAL | Status: DC
  Administered 2019-12-26 – 2020-01-03 (×24): 2 via ORAL
  Filled 2019-12-26 (×34): qty 2

## 2019-12-26 MED ORDER — BACID PO TABS
2.0000 | ORAL_TABLET | Freq: Three times a day (TID) | ORAL | Status: DC
  Filled 2019-12-26 (×10): qty 2

## 2019-12-26 MED ORDER — KCL IN DEXTROSE-NACL 20-5-0.45 MEQ/L-%-% IV SOLN
INTRAVENOUS | Status: DC
  Filled 2019-12-26 (×8): qty 1000

## 2019-12-26 MED ORDER — LEVALBUTEROL HCL 0.63 MG/3ML IN NEBU: 0.6300 mg | INHALATION_SOLUTION | Freq: Four times a day (QID) | RESPIRATORY_TRACT | Status: DC | PRN

## 2019-12-26 MED ORDER — SODIUM CHLORIDE 0.9% FLUSH
3.0000 mL | Freq: Two times a day (BID) | INTRAVENOUS | Status: DC
  Administered 2019-12-26 – 2020-01-03 (×11): 3 mL via INTRAVENOUS

## 2019-12-26 MED ORDER — DOCUSATE SODIUM 100 MG PO CAPS
100.0000 mg | ORAL_CAPSULE | Freq: Two times a day (BID) | ORAL | Status: DC
  Administered 2019-12-26 – 2019-12-31 (×7): 100 mg via ORAL
  Filled 2019-12-26 (×11): qty 1

## 2019-12-26 MED ORDER — CIPROFLOXACIN IN D5W 400 MG/200ML IV SOLN
400.0000 mg | Freq: Two times a day (BID) | INTRAVENOUS | Status: DC
  Administered 2019-12-26 – 2019-12-30 (×9): 400 mg via INTRAVENOUS
  Filled 2019-12-26 (×9): qty 200

## 2019-12-26 MED ORDER — IOHEXOL 300 MG/ML  SOLN
100.0000 mL | Freq: Once | INTRAMUSCULAR | Status: AC | PRN
  Administered 2019-12-26: 100 mL via INTRAVENOUS

## 2019-12-26 MED ORDER — CIPROFLOXACIN IN D5W 400 MG/200ML IV SOLN
400.0000 mg | Freq: Once | INTRAVENOUS | Status: AC
  Administered 2019-12-26: 400 mg via INTRAVENOUS
  Filled 2019-12-26: qty 200

## 2019-12-26 MED ORDER — ACETAMINOPHEN 325 MG PO TABS
650.0000 mg | ORAL_TABLET | Freq: Four times a day (QID) | ORAL | Status: DC | PRN
  Administered 2019-12-26 – 2020-01-01 (×3): 650 mg via ORAL
  Filled 2019-12-26 (×3): qty 2

## 2019-12-26 MED ORDER — ONDANSETRON HCL 4 MG/2ML IJ SOLN
4.0000 mg | Freq: Four times a day (QID) | INTRAMUSCULAR | Status: DC | PRN
  Administered 2019-12-26 – 2020-01-01 (×3): 4 mg via INTRAVENOUS
  Filled 2019-12-26 (×3): qty 2

## 2019-12-26 MED ORDER — HYDROMORPHONE HCL 1 MG/ML IJ SOLN
0.5000 mg | Freq: Once | INTRAMUSCULAR | Status: AC
  Administered 2019-12-26: 0.5 mg via INTRAVENOUS
  Filled 2019-12-26: qty 0.5

## 2019-12-26 MED ORDER — ALBUTEROL SULFATE (2.5 MG/3ML) 0.083% IN NEBU: 2.5000 mg | INHALATION_SOLUTION | RESPIRATORY_TRACT | Status: DC | PRN

## 2019-12-26 MED ORDER — HEPARIN SODIUM (PORCINE) 5000 UNIT/ML IJ SOLN
5000.0000 [IU] | Freq: Three times a day (TID) | INTRAMUSCULAR | Status: DC
  Administered 2019-12-26 – 2020-01-03 (×26): 5000 [IU] via SUBCUTANEOUS
  Filled 2019-12-26 (×26): qty 1

## 2019-12-26 MED ORDER — ONDANSETRON HCL 4 MG/2ML IJ SOLN
4.0000 mg | Freq: Once | INTRAMUSCULAR | Status: AC
  Administered 2019-12-26: 4 mg via INTRAVENOUS
  Filled 2019-12-26: qty 2

## 2019-12-26 MED ORDER — KETOROLAC TROMETHAMINE 15 MG/ML IJ SOLN
15.0000 mg | Freq: Four times a day (QID) | INTRAMUSCULAR | Status: AC | PRN
  Administered 2019-12-26 – 2019-12-30 (×12): 15 mg via INTRAVENOUS
  Filled 2019-12-26 (×12): qty 1

## 2019-12-26 MED ORDER — ONDANSETRON HCL 4 MG PO TABS
4.0000 mg | ORAL_TABLET | Freq: Four times a day (QID) | ORAL | Status: DC | PRN
  Administered 2020-01-02: 4 mg via ORAL
  Filled 2019-12-26: qty 1

## 2019-12-26 MED ORDER — SORBITOL 70 % SOLN
30.0000 mL | Freq: Every day | Status: DC | PRN
  Filled 2019-12-26: qty 30

## 2019-12-26 MED ORDER — METRONIDAZOLE IN NACL 5-0.79 MG/ML-% IV SOLN
500.0000 mg | Freq: Three times a day (TID) | INTRAVENOUS | Status: AC
  Administered 2019-12-26 – 2019-12-29 (×10): 500 mg via INTRAVENOUS
  Filled 2019-12-26 (×10): qty 100

## 2019-12-26 MED ORDER — METRONIDAZOLE IN NACL 5-0.79 MG/ML-% IV SOLN
500.0000 mg | Freq: Once | INTRAVENOUS | Status: AC
  Administered 2019-12-26: 500 mg via INTRAVENOUS
  Filled 2019-12-26: qty 100

## 2019-12-26 NOTE — ED Notes (Signed)
hospitalist at bedside

## 2019-12-26 NOTE — ED Provider Notes (Signed)
[facility] EMERGENCY DEPARTMENT Provider Note   CSN: [id number] Arrival date & time: 12/26/19  0011     History Chief Complaint  Patient presents with  . Abdominal Pain    Sharon Jordan is a 74 y.o. female.  Patient with COPD on home oxygen, hypertension, previous hip fracture presenting with a 24-hour history of abdominal pain with nausea and dry heaving.  States symptoms started in the evening time May 26 after eating dinner.  She has had diffuse abdominal pain since then with multiple episodes of nausea and dry heaving without vomiting.  States the pain is worse in her epigastric and right side.  It is constant.  Nothing makes it better or worse.  No chest pain.  No back pain.  Last vomit was today and normal.  No diarrhea.  No pain with urination or blood in the urine.  She is not had this pain previously.  States she no other has a gallbladder or appendix.  States her breathing is at baseline.  Called EMS tonight after having pain all day and unable to eat or drink today.  The history is provided by the patient.  Abdominal Pain Associated symptoms: nausea and vomiting   Associated symptoms: no cough, no diarrhea, no dysuria, no fatigue, no fever, no hematuria and no shortness of breath        Past Medical History:  Diagnosis Date  . Anemia   . At risk for delirium   . Closed fracture of anterior column of left acetabulum (HCC)   . Closed fracture of pelvis (HCC)   . COPD (chronic obstructive pulmonary disease) (HCC)   . Depression   . History of rheumatic fever   . HTN (hypertension)   . Hyponatremia   . Lupus (HCC)   . RLS (restless legs syndrome)     Patient Active Problem List   Diagnosis Date Noted  . Closed trochanteric fracture of right femur with routine healing 10/28/19 11/13/2019  . Chronic respiratory failure with hypoxia (HCC)   . Anxiety   . Hip fracture (HCC) 10/27/2019  . Acute exacerbation of chronic obstructive pulmonary disease (COPD) (HCC)  10/11/2019  . Intertrochanteric fracture of left femur (HCC) 09/05/2019  . Fracture of acetabulum, left, closed (HCC) 09/05/2019  . Hypertension 09/05/2019  . COPD (chronic obstructive pulmonary disease) (HCC) 09/05/2019  . Depression 09/05/2019  . GERD (gastroesophageal reflux disease) 09/05/2019  . RLS (restless legs syndrome) 09/05/2019  . Anemia 08/26/2019  . At risk for delirium 08/26/2019  . Hyponatremia 08/26/2019  . Closed fracture of pelvis (HCC) 08/23/2019    Past Surgical History:  Procedure Laterality Date  . ABDOMINAL HYSTERECTOMY    . APPENDECTOMY    . 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 CLOSED MANIPULATION AND CLOSED TREATMENT OF LEFT ACETABULUM FRACTURE, BOTH COLUMNS Left   . HIP SURGERY Left   . REMOVAL OF DEEP HARDWARE, LEFT INTRAMEDULLARY NAIL Left    TFN  . REPAIR OF LEFT PERITROCHANTERIC MALUNION WITH USE OF A BLADE PLATE Left 03/9832  . TONSILLECTOMY       OB History    Gravida  3   Para      Term      Preterm      AB      Living  2     SAB      TAB      Ectopic      Multiple  Live Births              History reviewed. No pertinent family history.  Social History   Tobacco Use  . Smoking status: Former Research scientist (life sciences)  . Smokeless tobacco: Never Used  Substance Use Topics  . Alcohol use: Never  . Drug use: Never    Home Medications Prior to Admission medications   Medication Sig Start Date End Date Taking? Authorizing Provider  acetaminophen (TYLENOL) 500 MG tablet Take 2 tablets (1,000 mg total) by mouth 3 (three) times daily. 10/31/19   Barton Dubois, MD  albuterol (PROVENTIL) (2.5 MG/3ML) 0.083% nebulizer solution Take 3 mLs (2.5 mg total) by nebulization every 8 (eight) hours as needed for wheezing or shortness of breath. 10/22/19   Granville Lewis C, PA-C  alendronate (FOSAMAX) 70 MG tablet Take 70 mg by mouth every morning. 11/29/19   [provider]  amLODipine (NORVASC) 5 MG tablet Take 1  tablet (5 mg total) by mouth daily. 10/22/19   Wille Celeste, PA-C  aspirin EC 81 MG tablet Take 81 mg by mouth 2 (two) times daily. QAM and QHS    [provider]  Budeson-Glycopyrrol-Formoterol (BREZTRI AEROSPHERE) 160-9-4.8 MCG/ACT AERO Inhale 1 puff into the lungs daily. 10/22/19   Granville Lewis C, PA-C  buPROPion (WELLBUTRIN XL) 300 MG 24 hr tablet Take 1 tablet (300 mg total) by mouth daily. 10/22/19   Granville Lewis C, PA-C  calcium carbonate (TUMS - DOSED IN MG ELEMENTAL CALCIUM) 500 MG chewable tablet Chew 1 tablet by mouth 3 (three) times daily with meals.     [provider]  carboxymethylcellulose (ARTIFICIAL TEARS) 1 % ophthalmic solution Place 2 drops into both eyes 3 (three) times daily. Instill 2 drops both eyes three times a day as needed for dry eyes 10/22/19   Wille Celeste, PA-C  Cholecalciferol (VITAMIN D-3) 125 MCG (5000 UT) TABS Give 5,000 units by mouth daily Patient taking differently: Take 5,000 Units by mouth daily.  10/22/19   Granville Lewis C, PA-C  clonazePAM (KLONOPIN) 0.5 MG tablet Take 0.5 tablets (0.25 mg total) by mouth every 12 (twelve) hours as needed for anxiety. 10/31/19   Barton Dubois, MD  docusate sodium (COLACE) 100 MG capsule Take 100 mg by mouth daily.    [provider]  ferrous sulfate 325 (65 FE) MG tablet Take 1 tablet (325 mg total) by mouth daily with breakfast. For Anemia 10/31/19   Barton Dubois, MD  gabapentin (NEURONTIN) 400 MG capsule Take 1 capsule (400 mg total) by mouth 3 (three) times daily. 10/22/19   Granville Lewis C, PA-C  guaiFENesin (MUCINEX) 600 MG 12 hr tablet Take 2 tablets (1,200 mg total) by mouth 2 (two) times daily. 10/22/19   Granville Lewis C, PA-C  magnesium hydroxide (MILK OF MAGNESIA) 400 MG/5ML suspension Take 30 mLs by mouth daily as needed for moderate constipation. 10/31/19   Barton Dubois, MD  Melatonin 3 MG TABS Take 3 mg by mouth at bedtime.    [provider]  methocarbamol (ROBAXIN) 500 MG tablet  Take 1 tablet (500 mg total) by mouth 2 (two) times daily as needed for muscle spasms. 10/22/19   Granville Lewis C, PA-C  ondansetron (ZOFRAN) 4 MG tablet Take 1 tablet (4 mg total) by mouth every 6 (six) hours as needed for nausea or vomiting. 10/22/19   Granville Lewis C, PA-C  pantoprazole (PROTONIX) 40 MG tablet Take 1 tablet (40 mg total) by mouth daily. 10/22/19   Granville Lewis  C, PA-C  Psyllium (METAMUCIL PO) Take by mouth See admin instructions. TAKE 1 SCOOP DAILY AT BEDTIME  FOR CONSTIPATION- HOLD FOR DIARRHEA    [provider]  rOPINIRole (REQUIP) 2 MG tablet Take 1 tablet (2 mg total) by mouth at bedtime. Patient taking differently: Take 4 mg by mouth at bedtime.  10/22/19   Roena Malady, PA-C  simethicone (MYLICON) 80 MG chewable tablet Chew 1 tablet (80 mg total) by mouth 3 (three) times daily as needed. 10/22/19   Roena Malady, PA-C    Allergies    Penicillins  Review of Systems   Review of Systems  Constitutional: Positive for activity change and appetite change. Negative for fatigue and fever.  HENT: Negative for congestion and rhinorrhea.   Respiratory: Negative for cough, chest tightness and shortness of breath.   Gastrointestinal: Positive for abdominal pain, nausea and vomiting. Negative for diarrhea.  Genitourinary: Negative for dysuria and hematuria.  Musculoskeletal: Negative for arthralgias and myalgias.  Skin: Negative for rash.  Neurological: Negative for dizziness, weakness, light-headedness and headaches.   all other systems are negative except as noted in the HPI and PMH.    Physical Exam Updated Vital Signs BP (!) 145/75   Pulse 100   Temp 98.3 F (36.8 C) (Oral)   Resp 20   SpO2 (!) 88%   Physical Exam Vitals and nursing note reviewed.  Constitutional:      General: She is in acute distress.     Appearance: She is well-developed. She is ill-appearing.     Comments: Dry heaving on exam  HENT:     Head: Normocephalic and atraumatic.      Mouth/Throat:     Pharynx: No oropharyngeal exudate.  Eyes:     Conjunctiva/sclera: Conjunctivae normal.     Pupils: Pupils are equal, round, and reactive to light.  Neck:     Comments: No meningismus. Cardiovascular:     Rate and Rhythm: Normal rate and regular rhythm.     Heart sounds: Normal heart sounds. No murmur.     Comments: Equal femoral, DP and PT pulse Pulmonary:     Effort: Pulmonary effort is normal. No respiratory distress.     Breath sounds: Normal breath sounds.  Abdominal:     Palpations: Abdomen is soft.     Tenderness: There is abdominal tenderness. There is guarding. There is no rebound.     Comments: Epigastric, right upper quadrant right lower quadrant tenderness with voluntary guarding  Musculoskeletal:        General: No tenderness. Normal range of motion.     Cervical back: Normal range of motion and neck supple.     Comments: No paraspinal lumbar tenderness  Skin:    General: Skin is warm.  Neurological:     Mental Status: She is alert and oriented to person, place, and time.     Cranial Nerves: No cranial nerve deficit.     Motor: No abnormal muscle tone.     Coordination: Coordination normal.     Comments:  5/5 strength throughout. CN 2-12 intact.Equal grip strength.   Psychiatric:        Behavior: Behavior normal.     ED Results / Procedures / Treatments   Labs (all labs ordered are listed, but only abnormal results are displayed) Labs Reviewed  CBC WITH DIFFERENTIAL/PLATELET - Abnormal; Notable for the following components:      Result Value   Hemoglobin 10.4 (*)    HCT 35.3 (*)  MCHC 29.5 (*)    RDW 16.1 (*)    Lymphs Abs 0.6 (*)    All other components within normal limits  COMPREHENSIVE METABOLIC PANEL - Abnormal; Notable for the following components:   Sodium 133 (*)    Potassium 3.4 (*)    Chloride 90 (*)    CO2 33 (*)    Glucose, Bld 102 (*)    Calcium 8.7 (*)    Total Protein 6.0 (*)    Albumin 3.4 (*)    AST 13 (*)     All other components within normal limits  URINALYSIS, ROUTINE W REFLEX MICROSCOPIC - Abnormal; Notable for the following components:   Ketones, ur 5 (*)    Nitrite POSITIVE (*)    Bacteria, UA RARE (*)    All other components within normal limits  URINE CULTURE  SARS CORONAVIRUS 2 BY RT PCR (HOSPITAL ORDER, PERFORMED IN [facility])  LIPASE, BLOOD  LACTIC ACID, PLASMA  LACTIC ACID, PLASMA  TROPONIN I (HIGH SENSITIVITY)  TROPONIN I (HIGH SENSITIVITY)    EKG EKG Interpretation  Date/Time:  Friday Dec 26 2019 00:25:46 EDT Ventricular Rate:  99 PR Interval:    QRS Duration: 88 QT Interval:  341 QTC Calculation: 438 R Axis:   82 Text Interpretation: Sinus rhythm Borderline right axis deviation No significant change was found Confirmed by Glynn Octaveancour, Ilya Ess [phone]) on 12/26/2019 12:43:17 AM   Radiology CT ABDOMEN PELVIS W CONTRAST  Result Date: 12/26/2019 CLINICAL DATA:  Right abdominal pain with nausea EXAM: CT ABDOMEN AND PELVIS WITH CONTRAST TECHNIQUE: Multidetector CT imaging of the abdomen and pelvis was performed using the standard protocol following bolus administration of intravenous contrast. CONTRAST:  100mL OMNIPAQUE IOHEXOL 300 MG/ML  SOLN COMPARISON:  Right hip radiographs 12/16/2019 FINDINGS: Lower chest: Few fluid-filled airways are seen in the right lung base. Mild atelectatic changes. Lung bases otherwise clear without pleural effusion. Normal cardiac size. Likely calcification upon the mitral annulus. No pericardial effusion. Hepatobiliary: Focal fatty infiltration seen along the falciform ligament. Otherwise normal a Paddock attenuation concerning focal liver lesion. Smooth liver surface contour. Patient is post cholecystectomy. Slight prominence of the biliary tree likely related to reservoir effect. No calcified intraductal gallstones. Pancreas: Diffuse moderate atrophy of the pancreas without ductal dilatation or peripancreatic inflammation. Spleen: Normal in  size without focal abnormality. Adrenals/Urinary Tract: 1 cm nodule in the body of the left adrenal gland, indeterminate (2/13). No right adrenal lesion. Kidneys enhance and excrete symmetrically. Scattered subcentimeter hypoattenuating foci in both kidneys too small to fully characterize on CT imaging but statistically likely benign. No worrisome renal mass. No obstructive urolithiasis or hydronephrosis. There is mild bladder wall thickening with some faint perivesicular stranding. Stomach/Bowel: Fluid-filled moderate hiatal hernia. Distal stomach and duodenum are unremarkable. Several clustered loops of fluid-filled small bowel seen in the left lower quadrant with associated hazy mesenteric stranding. No evidence of obstruction. Appendix is not visualized. The appendix is surgically absent. High attenuation material throughout the proximal colon may reflect recent ingestion of bismuth containing products. Some mild diffuse pancolonic edematous thickening is noted as well though may be accentuated by underdistention. Scattered colonic diverticula without focal pericolonic inflammation to suggest diverticulitis. Vascular/Lymphatic: Atherosclerotic calcifications throughout the abdominal aorta and branch vessels. Mild aortic tortuosity. No aneurysm or ectasia. No enlarged abdominopelvic lymph nodes. Reproductive: Uterus is surgically absent. No concerning adnexal lesions. Other: Lentiform 4.4 x 1.9 x 6 cm fluid collection in the left hip likely related to prior left femur ORIF.  Mid mesenteric hazy stranding, as above. No abdominopelvic free fluid or air. No bowel containing hernias. Musculoskeletal: The osseous structures appear diffusely demineralized which may limit detection of small or nondisplaced fractures. No acute osseous abnormality or suspicious osseous lesion. Stable appearance of an L1 compression deformity seen on comparison radiographs. Levocurvature of the lumbar spine is similar to priors. Slight  rightward lateral listhesis of L2 on L3 is unchanged from comparison radiography. Multilevel discogenic and facet degenerative changes present throughout the lumbar spine. Prior left femoral ORIF. Fragmentation along the greater trochanter may be postsurgical. There is an age-indeterminate fracture of the right greater trochanter. IMPRESSION: 1. Several clustered loops of fluid-filled small bowel in the left lower quadrant as well as diffuse mild pancolonic edematous thickening with associated hazy mesenteric stranding, may represent a nonspecific enterocolitis. 2. Moderate fluid-filled hiatal hernia. Few fluid-filled airways in the right lung base could reflect aspiration. 3. Colonic diverticulosis without evidence of diverticulitis. 4. Lentiform 4.4 x 1.9 x 6 cm fluid collection in the left hip likely related to prior left femur ORIF however sterility is not ascertained by imaging. 5. Age-indeterminate fracture of the right greater trochanter. Recommend correlation with point tenderness. 6. Stable appearance of an L1 compression deformity seen on comparison radiographs. 7. Prior cholecystectomy, hysterectomy, appendectomy. 8. Aortic Atherosclerosis (ICD10-I70.0). Electronically Signed   By: Kreg Shropshire M.D.   On: 12/26/2019 03:21    Procedures Procedures (including critical care time)  Medications Ordered in ED Medications  ondansetron (ZOFRAN) injection 4 mg (has no administration in time range)  sodium chloride 0.9 % bolus 500 mL (has no administration in time range)    ED Course  I have reviewed the triage vital signs and the nursing notes.  Pertinent labs & imaging results that were available during my care of the patient were reviewed by me and considered in my medical decision making (see chart for details).    MDM Rules/Calculators/A&P                      Diffuse abdominal pain with nausea and vomiting worse on the right side.  EKG without acute ischemia.  Will check labs and CT  imaging.  Patient given hydration and symptom control.  UA with positive nitrate only. Patient denies symptoms. Will send culture. Labs show normal lactate. No leukocytosis  CT scan with multiple dilated bowel loops concerning for enterocolitis. Also shows hiatal hernia.  Hip findings noted.  Patient with right greater trochanter fracture which is followed by Dr. Romeo Apple.  This is not bothering her today.  This appears to be subacute/chronic. Fluid collection noted above her left hip.  Patient underwent surgical revision of this in January 2021 at [facility].  She follows with Dr. Romeo Apple regarding this as well.  She denies any pain in this area today or fever.  Patient straight heaving has improved.  She remains tender on exam without peritoneal signs.  We will start empiric antibiotics given her enterocolitis and possible aspiration and positive nitrate in her urine.  She is penicillin allergic.  She is given Cipro and Flagyl.  Lactate remains normal.  Continue oral hydration. Plan admission for IV antibiotics and hydration overnight.  Discussed with Dr. Sharl Ma. Dr. Romeo Apple may be able to evaluate her hip fluid collection tomorrow. No fever or significant pain with range of motion to suggest septic joint.  Final Clinical Impression(s) / ED Diagnoses Final diagnoses:  Enterocolitis    Rx / DC Orders ED Discharge Orders  None       Glynn Octave, MD 12/26/19 [phone]

## 2019-12-26 NOTE — H&P (Signed)
History and Physical   Patient: Sharon Jordan                            PCP: [name], MD                    DOB: 1945-09-10            DOA: 12/26/2019 OZH:[id number]             DOS: 12/26/2019, 11:23 AM  Patient coming from:   Home  I have personally reviewed patient's medical records, in electronic medical records, including:  [facility], and care everywhere.    Chief Complaint:   Chief Complaint  Patient presents with  . Abdominal Pain    History of present illness:    Sharon Jordan is a 74 y.o. 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  Presented: With abdominal associate with nausea vomiting unable to tolerate solids or liquids.  Reporting diffuse abdominal pain worsening over the past 24 hours pain is most consistent in epigastric area and right area does not radiate to the back.   Not associate with any fever chills more nausea some vomiting not associate with diarrhea or constipation.  Reporting a mild dysuria, with no discharge or foul odor.   Patient Denies having: Fever, Chills, Cough, SOB, Chest Pain,, headache, dizziness, lightheadedness, Joint pain, rash, open wounds  ED Course:   ED evaluation hemodynamically stable, mildly hypertensive, afebrile CBC CMP within normal limits exception of hemoglobin 10.4 hematocrit 35.3, sodium 133 potassium 3.4, BUN 12, creatinine 0.53, calcium 8.7, glucose 102, lactic acid 0.7, 0.5, UA consistent with nitrites, CT of abdomen: Reviewed acute on chronic changes were noted along with acute finding consistent with pancolonic enteric colitis IMPRESSION: 1. Several clustered loops of fluid-filled small bowel in the left lower quadrant as well as diffuse mild pancolonic edematous thickening with associated hazy  mesenteric stranding, may represent a nonspecific enterocolitis. 2. Moderate fluid-filled hiatal hernia. Few fluid-filled airways in the right lung base could reflect aspiration. 3. Colonic diverticulosis without evidence of diverticulitis. 4. Lentiform 4.4 x 1.9 x 6 cm fluid collection in the left hip likely related to prior left femur ORIF however sterility is not ascertained by imaging. 5. Age-indeterminate fracture of the right greater trochanter. Recommend correlation with point tenderness. 6. Stable appearance of an L1 compression deformity seen on comparison radiographs. 7. Prior cholecystectomy, hysterectomy, appendectomy. 8. Aortic Atherosclerosis (ICD10-I70.0).     Review of Systems: As per HPI, otherwise 10 point review of systems were negative.   ----------------------------------------------------------------------------------------------------------------------  Allergies  Allergen Reactions  . Penicillins Diarrhea    Patient states she is allergic to 'all antibiotics' states has diarrhea and nausea.    Home MEDs:  Prior to Admission medications   Medication Sig Start Date End Date Taking? Authorizing Provider  acetaminophen (TYLENOL) 500 MG tablet Take 2 tablets (1,000 mg total) by mouth 3 (three) times daily. 10/31/19   Vassie Loll, MD  albuterol (PROVENTIL) (2.5 MG/3ML) 0.083% nebulizer solution Take 3 mLs (2.5 mg total) by nebulization every 8 (eight) hours as needed for wheezing or shortness of breath. 10/22/19   Edmon Crape C, PA-C  alendronate (FOSAMAX) 70 MG tablet Take 70 mg by mouth every morning. 11/29/19   [provider]  amLODipine (NORVASC) 5 MG tablet Take 1 tablet (5 mg total) by mouth daily. 10/22/19   Roena Malady, PA-C  aspirin EC 81 MG tablet Take 81 mg by mouth 2 (two) times daily. QAM and QHS    [provider]  Budeson-Glycopyrrol-Formoterol (BREZTRI AEROSPHERE) 160-9-4.8 MCG/ACT AERO Inhale 1 puff into the lungs daily.  10/22/19   Edmon Crape C, PA-C  buPROPion (WELLBUTRIN XL) 300 MG 24 hr tablet Take 1 tablet (300 mg total) by mouth daily. 10/22/19   Edmon Crape C, PA-C  calcium carbonate (TUMS - DOSED IN MG ELEMENTAL CALCIUM) 500 MG chewable tablet Chew 1 tablet by mouth 3 (three) times daily with meals.     [provider]  carboxymethylcellulose (ARTIFICIAL TEARS) 1 % ophthalmic solution Place 2 drops into both eyes 3 (three) times daily. Instill 2 drops both eyes three times a day as needed for dry eyes 10/22/19   Roena Malady, PA-C  Cholecalciferol (VITAMIN D-3) 125 MCG (5000 UT) TABS Give 5,000 units by mouth daily Patient taking differently: Take 5,000 Units by mouth daily.  10/22/19   Edmon Crape C, PA-C  clonazePAM (KLONOPIN) 0.5 MG tablet Take 0.5 tablets (0.25 mg total) by mouth every 12 (twelve) hours as needed for anxiety. 10/31/19   Vassie Loll, MD  docusate sodium (COLACE) 100 MG capsule Take 100 mg by mouth daily.    [provider]  ferrous sulfate 325 (65 FE) MG tablet Take 1 tablet (325 mg total) by mouth daily with breakfast. For Anemia 10/31/19   Vassie Loll, MD  gabapentin (NEURONTIN) 400 MG capsule Take 1 capsule (400 mg total) by mouth 3 (three) times daily. 10/22/19   Edmon Crape C, PA-C  guaiFENesin (MUCINEX) 600 MG 12 hr tablet Take 2 tablets (1,200 mg total) by mouth 2 (two) times daily. 10/22/19   Edmon Crape C, PA-C  magnesium hydroxide (MILK OF MAGNESIA) 400 MG/5ML suspension Take 30 mLs by mouth daily as needed for moderate constipation. 10/31/19   Vassie Loll, MD  Melatonin 3 MG TABS Take 3 mg by mouth at bedtime.    [provider]  methocarbamol (ROBAXIN) 500 MG tablet Take 1 tablet (500 mg total) by mouth 2 (two) times daily as needed for muscle spasms. 10/22/19   Edmon Crape C, PA-C  ondansetron (ZOFRAN) 4 MG tablet Take 1 tablet (4 mg total) by mouth every 6 (six) hours as needed for nausea or vomiting. 10/22/19   Edmon Crape C, PA-C  pantoprazole  (PROTONIX) 40 MG tablet Take 1 tablet (40 mg total) by mouth daily. 10/22/19   Edmon Crape C, PA-C  Psyllium (METAMUCIL PO) Take by mouth See admin instructions. TAKE 1 SCOOP DAILY AT BEDTIME  FOR CONSTIPATION- HOLD FOR DIARRHEA    [provider]  rOPINIRole (REQUIP) 2 MG tablet Take 1 tablet (2 mg total) by mouth at bedtime. Patient taking differently: Take 4 mg by mouth at bedtime.  10/22/19   Roena Malady, PA-C  simethicone (MYLICON) 80 MG chewable tablet Chew 1 tablet (80 mg total) by mouth 3 (three) times daily as needed. 10/22/19   Edmon Crape C, PA-C    PRN MEDs: acetaminophen **OR** acetaminophen, ketorolac, levalbuterol, ondansetron **OR** ondansetron (ZOFRAN) IV, senna-docusate, sorbitol, traZODone  Past Medical History:  Diagnosis Date  . Anemia   . At risk for delirium   . Closed fracture of anterior column of left acetabulum (HCC)   . Closed fracture of pelvis (HCC)   . COPD (chronic obstructive  pulmonary disease) (HCC)   . Depression   . History of rheumatic fever   . HTN (hypertension)   . Hyponatremia   . Lupus (HCC)   . RLS (restless legs syndrome)     Past Surgical History:  Procedure Laterality Date  . ABDOMINAL HYSTERECTOMY    . APPENDECTOMY    . 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 CLOSED MANIPULATION AND CLOSED TREATMENT OF LEFT ACETABULUM FRACTURE, BOTH COLUMNS Left   . HIP SURGERY Left   . REMOVAL OF DEEP HARDWARE, LEFT INTRAMEDULLARY NAIL Left    TFN  . 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 TONSILLECTOMY       reports that she has quit smoking. She has never used smokeless tobacco. She reports that she does not drink alcohol or use drugs.   History reviewed. No pertinent family history.  Physical Exam:   Vitals:   12/26/19 0700 12/26/19 0730 12/26/19 0800 12/26/19 0830  BP: (!) 160/72 (!) 177/82 (!) 172/69 (!) 157/64  Pulse: 92 90    Resp: 14 15 14 14   Temp:        TempSrc:      SpO2: 100% 100%     Constitutional: NAD, calm, comfortable Eyes: PERRL, lids and conjunctivae normal ENMT: Mucous membranes are moist. Posterior pharynx clear of any exudate or lesions.Normal dentition.  Neck: normal, supple, no masses, no thyromegaly Respiratory: clear to auscultation bilaterally, no wheezing, no crackles. Normal respiratory effort. No accessory muscle use.  Cardiovascular: Regular rate and rhythm, no murmurs / rubs / gallops. No extremity edema. 2+ pedal pulses. No carotid bruits.  Abdomen: Diffuse tenderness, no masses palpated. No hepatosplenomegaly. Bowel sounds positive.  Musculoskeletal: no clubbing / cyanosis. No joint deformity upper and lower extremities. Good ROM, no contractures. Normal muscle tone.  Neurologic: CN II-XII grossly intact. Sensation intact, DTR normal. Strength 5/5 in all 4.  Psychiatric: Normal judgment and insight. Alert and oriented x 3. Normal mood.  Skin: no rashes, lesions, ulcers. No induration  Wounds: per nursing documentation         Labs on admission:    I have personally reviewed following labs and imaging studies  CBC: Recent Labs  Lab 12/26/19 0110  WBC 5.2  NEUTROABS 4.0  HGB 10.4*  HCT 35.3*  MCV 91.0  PLT 256   Basic Metabolic Panel: Recent Labs  Lab 12/26/19 0110 12/26/19 0918  NA 133*  --   K 3.4*  --   CL 90*  --   CO2 33*  --   GLUCOSE 102*  --   BUN 12  --   CREATININE 0.53  --   CALCIUM 8.7* 8.5*  MG  --  1.6*  PHOS  --  3.3   GFR: CrCl cannot be calculated (Unknown ideal weight.). Liver Function Tests: Recent Labs  Lab 12/26/19 0110  AST 13*  ALT 11  ALKPHOS 74  BILITOT 0.7  PROT 6.0*  ALBUMIN 3.4*   Recent Labs  Lab 12/26/19 0110  LIPASE 22   No results for input(s): AMMONIA in the last 168 hours. Coagulation Profile: No results for input(s): INR, PROTIME in the last 168 hours. Cardiac Enzymes: No results for input(s): CKTOTAL, CKMB, CKMBINDEX, TROPONINI in  the last 168 hours. BNP (last 3 results) No results for input(s): PROBNP in the last 8760 hours. HbA1C: No results for input(s): HGBA1C in the last 72 hours. CBG: Recent Labs  Lab 12/26/19 0945  GLUCAP 89   Lipid Profile: No results for input(s): CHOL, HDL, LDLCALC, TRIG, CHOLHDL, LDLDIRECT in the last 72 hours. Thyroid Function Tests: Recent Labs    12/26/19 0918  TSH 1.621   Anemia Panel: No results for input(s): VITAMINB12, FOLATE, FERRITIN, TIBC, IRON, RETICCTPCT in the last 72 hours. Urine analysis:    Component Value Date/Time   COLORURINE YELLOW 12/26/2019 0111   APPEARANCEUR CLEAR 12/26/2019 0111   LABSPEC 1.011 12/26/2019 0111   PHURINE 7.0 12/26/2019 0111   GLUCOSEU NEGATIVE 12/26/2019 0111   HGBUR NEGATIVE 12/26/2019 0111   BILIRUBINUR NEGATIVE 12/26/2019 0111   KETONESUR 5 (A) 12/26/2019 0111   PROTEINUR NEGATIVE 12/26/2019 0111   NITRITE POSITIVE (A) 12/26/2019 0111   LEUKOCYTESUR NEGATIVE 12/26/2019 0111     Radiologic Exams on Admission:   CT ABDOMEN PELVIS W CONTRAST  Addendum Date: 12/26/2019   ADDENDUM REPORT: 12/26/2019 04:35 ADDENDUM: 1 cm left adrenal nodule. In the absence of known cancer history, finding is likely benign such as an adrenal adenoma. Could consider 12 month follow-up adrenal CT to assess for stability. This recommendation follows ACR consensus guidelines: Management of Incidental Adrenal Masses: A White Paper of the ACR Incidental Findings Committee. J Am Coll Radiol 2017;14:1038-1044. Electronically Signed   By: Kreg ShropshirePrice  DeHay M.D.   On: 12/26/2019 04:35   Result Date: 12/26/2019 CLINICAL DATA:  Right abdominal pain with nausea EXAM: CT ABDOMEN AND PELVIS WITH CONTRAST TECHNIQUE: Multidetector CT imaging of the abdomen and pelvis was performed using the standard protocol following bolus administration of intravenous contrast. CONTRAST:  100mL OMNIPAQUE IOHEXOL 300 MG/ML  SOLN COMPARISON:  Right hip radiographs 12/16/2019 FINDINGS: Lower  chest: Few fluid-filled airways are seen in the right lung base. Mild atelectatic changes. Lung bases otherwise clear without pleural effusion. Normal cardiac size. Likely calcification upon the mitral annulus. No pericardial effusion. Hepatobiliary: Focal fatty infiltration seen along the falciform ligament. Otherwise normal a Paddock attenuation concerning focal liver lesion. Smooth liver surface contour. Patient is post cholecystectomy. Slight prominence of the biliary tree likely related to reservoir effect. No calcified intraductal gallstones. Pancreas: Diffuse moderate atrophy of the pancreas without ductal dilatation or peripancreatic inflammation. Spleen: Normal in size without focal abnormality. Adrenals/Urinary Tract: 1 cm nodule in the body of the left adrenal gland, indeterminate (2/13). No right adrenal lesion. Kidneys enhance and excrete symmetrically. Scattered subcentimeter hypoattenuating foci in both kidneys too small to fully characterize on CT imaging but statistically likely benign. No worrisome renal mass. No obstructive urolithiasis or hydronephrosis. There is mild bladder wall thickening with some faint perivesicular stranding. Stomach/Bowel: Fluid-filled moderate hiatal hernia. Distal stomach and duodenum are unremarkable. Several clustered loops of fluid-filled small bowel seen in the left lower quadrant with associated hazy mesenteric stranding. No evidence of obstruction. Appendix is not visualized. The appendix is surgically absent. High attenuation material throughout the proximal colon may reflect recent ingestion of bismuth containing products. Some mild diffuse pancolonic edematous thickening is noted as well though may be accentuated by underdistention. Scattered colonic diverticula without focal pericolonic inflammation to suggest diverticulitis. Vascular/Lymphatic: Atherosclerotic calcifications throughout the abdominal aorta and branch vessels. Mild aortic tortuosity. No aneurysm  or ectasia. No enlarged abdominopelvic lymph nodes. Reproductive: Uterus is surgically absent. No concerning adnexal lesions. Other: Lentiform 4.4 x 1.9 x 6 cm fluid collection in the left hip likely related to prior left femur ORIF. Mid mesenteric hazy stranding, as above. No abdominopelvic free fluid or air. No bowel containing hernias. Musculoskeletal: The  osseous structures appear diffusely demineralized which may limit detection of small or nondisplaced fractures. No acute osseous abnormality or suspicious osseous lesion. Stable appearance of an L1 compression deformity seen on comparison radiographs. Levocurvature of the lumbar spine is similar to priors. Slight rightward lateral listhesis of L2 on L3 is unchanged from comparison radiography. Multilevel discogenic and facet degenerative changes present throughout the lumbar spine. Prior left femoral ORIF. Fragmentation along the greater trochanter may be postsurgical. There is an age-indeterminate fracture of the right greater trochanter. IMPRESSION: 1. Several clustered loops of fluid-filled small bowel in the left lower quadrant as well as diffuse mild pancolonic edematous thickening with associated hazy mesenteric stranding, may represent a nonspecific enterocolitis. 2. Moderate fluid-filled hiatal hernia. Few fluid-filled airways in the right lung base could reflect aspiration. 3. Colonic diverticulosis without evidence of diverticulitis. 4. Lentiform 4.4 x 1.9 x 6 cm fluid collection in the left hip likely related to prior left femur ORIF however sterility is not ascertained by imaging. 5. Age-indeterminate fracture of the right greater trochanter. Recommend correlation with point tenderness. 6. Stable appearance of an L1 compression deformity seen on comparison radiographs. 7. Prior cholecystectomy, hysterectomy, appendectomy. 8. Aortic Atherosclerosis (ICD10-I70.0). Electronically Signed: By: Lovena Le M.D. On: 12/26/2019 03:21    EKG:    Independently reviewed.  Orders placed or performed during the hospital encounter of 12/26/19  . EKG 12-Lead  . EKG 12-Lead  . EKG 12-Lead  . EKG 12-Lead  . EKG 12-Lead  . EKG   ---------------------------------------------------------------------------------------------------------------------------------------    Assessment / Plan:   Active Problems:   Enterocolitis   Colitis  Abdominal pain/enterocolitis -Abdominal pain associate with nausea vomiting, unable to tolerate solids or liquids at this time -Patient be admitted -We will keep the patient n.p.o. at this time, -As needed IVantiemetics, analgesics -IV fluid initiated D5 half-normal saline with 20 of K -reevaluating in 8 to 12 hours -Initiated IV ciprofloxacin/Flagyl will be continued -CT of abdomen: Reviewed acute on chronic changes were noted along with acute finding consistent with pancolonic enteric colitis   History of left hip fracture,, right greater trochanter fracture, stable L1 compression fracture -CT reviewed evident of chronic changes -Along with fluid collection in the left hip area consistent with previous surgery -No point tenderness -We will continue to monitor -As needed analgesics  Abnormal UA with positive nitrite -We will follow the urine culture -Patient is currently on ciprofloxacin and Flagyl for colitis which will also cover possible UTI.  History of chronic anemia due to chronic disease -H&H stable -We will continue to monitor -No report of hematemesis or hematochezia noted   Chronic COPD -O2 dependent 2 L currently at baseline -No signs of exacerbation -As needed DuoNeb bronchodilator treatment  History of chronic hyponatremia -  Recent history of closed trochanteric fracture right hip on 10/28/2019 -Currently stable, continue as needed analgesics  Hypertension -Stable continue home medication of Norvasc  Depression Currently stable continue home medication as needed  clonazepam  GERD -on  Protonix -we may hold to reduce risk of C. difficile as patient will be on dual antibiotics  Restless leg syndrome -Continue Requip  History of osteoporosis -On Fosamax   Cultures:  - none  Antimicrobial: -12/26/2019 IV Cipro/Flagyl >>   Consults called:  None -------------------------------------------------------------------------------------------------------------------------------------------- DVT prophylaxis: SCD/Compression stockings and Heparin SQ Code Status:   Code Status: Full Code   Admission status: Patient will be admitted as Inpatient, with a less than 2 midnight length of stay.   Family Communication:  none at bedside  (The above findings and plan of care has been discussed with patient in detail, the patient expressed understanding and agreement of above plan)  --------------------------------------------------------------------------------------------------------------------------------------------------  Disposition Plan: >3 days Status is: Inpatient  Remains inpatient appropriate because:Ongoing active pain requiring inpatient pain management   Dispo: The patient is from: Home              Anticipated d/c is to: Home with home health versus SNF              Anticipated d/c date is: > 3 days              Patient currently is not medically stable to d/c.  Due to poor p.o. intake, dehydration, severe debility abdominal pain due to colitis-needing IV antibiotic, IV analgesics           ----------------------------------------------------------------------------------------------------------------------------------------------------  Time spent: > than  55  Min.   SIGNED: Kendell Bane, MD, FACP, FHM. [facility],  Pager (Please use [email or web address] to page to text)  If 7PM-7AM, please contact night-coverage [email or web address] 12/26/2019, 11:23 AM

## 2019-12-26 NOTE — ED Notes (Signed)
Lab at bedside

## 2019-12-26 NOTE — ED Triage Notes (Signed)
[facility] - pt c/o abdominal pain last night with nausea.

## 2019-12-27 ENCOUNTER — Other Ambulatory Visit: Payer: Self-pay

## 2019-12-27 DIAGNOSIS — K219 Gastro-esophageal reflux disease without esophagitis: Secondary | ICD-10-CM

## 2019-12-27 DIAGNOSIS — I10 Essential (primary) hypertension: Secondary | ICD-10-CM

## 2019-12-27 DIAGNOSIS — F329 Major depressive disorder, single episode, unspecified: Secondary | ICD-10-CM

## 2019-12-27 DIAGNOSIS — J449 Chronic obstructive pulmonary disease, unspecified: Secondary | ICD-10-CM

## 2019-12-27 DIAGNOSIS — G2581 Restless legs syndrome: Secondary | ICD-10-CM

## 2019-12-27 LAB — BASIC METABOLIC PANEL
Anion gap: 10 (ref 5–15)
BUN: 8 mg/dL (ref 8–23)
CO2: 32 mmol/L (ref 22–32)
Calcium: 9 mg/dL (ref 8.9–10.3)
Chloride: 90 mmol/L — ABNORMAL LOW (ref 98–111)
Creatinine, Ser: 0.52 mg/dL (ref 0.44–1.00)
GFR calc Af Amer: >60 mL/min (ref >60)
GFR calc non Af Amer: >60 mL/min (ref >60)
Glucose, Bld: 105 mg/dL — ABNORMAL HIGH (ref 70–99)
Potassium: 3.5 mmol/L (ref 3.5–5.1)
Sodium: 132 mmol/L — ABNORMAL LOW (ref 135–145)

## 2019-12-27 LAB — CBC
HCT: 35.9 % — ABNORMAL LOW (ref 36.0–46.0)
Hemoglobin: 11 g/dL — ABNORMAL LOW (ref 12.0–15.0)
MCH: 27.6 pg (ref 26.0–34.0)
MCHC: 30.6 g/dL (ref 30.0–36.0)
MCV: 90 fL (ref 80.0–100.0)
Platelets: 242 10*3/uL (ref 150–400)
RBC: 3.99 MIL/uL (ref 3.87–5.11)
RDW: 15.8 % — ABNORMAL HIGH (ref 11.5–15.5)
WBC: 5.7 10*3/uL (ref 4.0–10.5)
nRBC: 0 % (ref 0.0–0.2)

## 2019-12-27 LAB — GLUCOSE, CAPILLARY
Glucose-Capillary: 119 mg/dL — ABNORMAL HIGH (ref 70–99)
Glucose-Capillary: 116 mg/dL — ABNORMAL HIGH (ref 70–99)
Glucose-Capillary: 117 mg/dL — ABNORMAL HIGH (ref 70–99)

## 2019-12-27 LAB — APTT: aPTT: 67 seconds — ABNORMAL HIGH (ref 24–36)

## 2019-12-27 LAB — PROTIME-INR
INR: 1 (ref 0.8–1.2)
Prothrombin Time: 12.7 seconds (ref 11.4–15.2)

## 2019-12-27 MED ORDER — GUAIFENESIN-DM 100-10 MG/5ML PO SYRP
5.0000 mL | ORAL_SOLUTION | ORAL | Status: DC | PRN
  Administered 2019-12-27: 5 mL via ORAL
  Filled 2019-12-27: qty 5

## 2019-12-27 MED ORDER — ALUM & MAG HYDROXIDE-SIMETH 200-200-20 MG/5ML PO SUSP
30.0000 mL | ORAL | Status: DC | PRN
  Administered 2019-12-31: 30 mL via ORAL
  Filled 2019-12-27: qty 30

## 2019-12-27 MED ORDER — HYDROMORPHONE HCL 1 MG/ML IJ SOLN
0.5000 mg | Freq: Four times a day (QID) | INTRAMUSCULAR | Status: DC | PRN
  Administered 2019-12-27 – 2020-01-02 (×21): 0.5 mg via INTRAVENOUS
  Filled 2019-12-27 (×21): qty 0.5

## 2019-12-27 MED ORDER — SALINE SPRAY 0.65 % NA SOLN
1.0000 | NASAL | Status: DC | PRN
  Filled 2019-12-27: qty 44

## 2019-12-27 MED ORDER — PHENOL 1.4 % MT LIQD: 1.0000 | OROMUCOSAL | Status: DC | PRN

## 2019-12-27 NOTE — Progress Notes (Signed)
PROGRESS NOTE    Sharon Jordan  ZOX:[id number] DOB: 1945/09/04 DOA: 12/26/2019 PCP: Lucia Gaskins, MD    Chief Complaint  Patient presents with  . Abdominal Pain    Brief Narrative:  As per H&P written by Dr. Roger Shelter on 12/26/2019 74 y.o. 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  Presented: With abdominal associate with nausea vomiting unable to tolerate solids or liquids.  Reporting diffuse abdominal pain worsening over the past 24 hours pain is most consistent in epigastric area and right area does not radiate to the back.   Not associate with any fever chills more nausea some vomiting not associate with diarrhea or constipation.  Reporting a mild dysuria, with no discharge or foul odor.   Patient Denies having: Fever, Chills, Cough, SOB, Chest Pain,, headache, dizziness, lightheadedness, Joint pain, rash, open wounds  ED Course:   ED evaluation hemodynamically stable, mildly hypertensive, afebrile CBC CMP within normal limits exception of hemoglobin 10.4 hematocrit 35.3, sodium 133 potassium 3.4, BUN 12, creatinine 0.53, calcium 8.7, glucose 102, lactic acid 0.7, 0.5, UA consistent with nitrites, CT of abdomen: Reviewed acute on chronic changes were noted along with acute finding consistent with pancolonic enteric colitis IMPRESSION: 1. Several clustered loops of fluid-filled small bowel in the left lower quadrant as well as diffuse mild pancolonic edematous thickening with associated hazy mesenteric stranding, may represent a nonspecific enterocolitis. 2. Moderate fluid-filled hiatal hernia. Few fluid-filled airways in the right lung base could reflect aspiration. 3. Colonic diverticulosis without evidence of diverticulitis. 4. Lentiform 4.4 x 1.9 x 6 cm fluid  collection in the left hip likely related to prior left femur ORIF however sterility is not ascertained by imaging. 5. Age-indeterminate fracture of the right greater trochanter. Recommend correlation with point tenderness. 6. Stable appearance of an L1 compression deformity seen on comparison radiographs. 7. Prior cholecystectomy, hysterectomy, appendectomy. 8. Aortic Atherosclerosis (ICD10-I70.0).  Assessment & Plan: 1-enterocolitis -Presumed infections in the origin -Follow cultures -Continue IV antibiotics -Slowly advance diet -Continue as needed analgesics and antiemetics -Follow clinical response. -Continue IV fluids and supportive care.  2-Intertrochanteric fracture of left femur ([place])  -No signs of acute disalignment or fracture -Chronic changes associated with previous surgery. -Continue as needed analgesics -PT evaluation will be requested.  3-Hypertension -Overall stable and well-controlled -Continue to follow vital signs -Holding antihypertensive agents currently.  4-chronic respiratory failure due to COPD (chronic obstructive pulmonary disease) (HCC) -No wheezing or shortness of breath -Continue oxygen supplementation -Continue as needed bronchodilators.  5-history of depression/anxiety -Most recent ideation hallucination -Continue as needed anxiolytics.  6-GERD (gastroesophageal reflux disease) -Currently without any reflux symptoms -Holding PPIs for receiving antibiotics -If needed will add famotidine.  7-RLS (restless legs syndrome) -Continue the use of Requip  8-history of osteoporosis -Continue Fosamax after discharge.   DVT prophylaxis: Heparin Code Status: Full code Family Communication: No family at bedside. Disposition:   Status is: Inpatient  Dispo: The patient is from: home              Anticipated d/c is to: home              Anticipated d/c date is: to be determined               Patient currently No medically ready for  discharge; still having significant abdominal pain requiring IV  analgesics.  Will continue IV antibiotics, slowly advance diet.  Physical therapy consultation in place.        Consultants:   None   Procedures:   See below for x-ray reports   Antimicrobials:   Ciprofloxacin and Flagyl   Subjective: Afebrile, still complaining of significant discomfort in her abdomen and feeling weak/deconditioned.  Objective: Vitals:   12/27/19 0006 12/27/19 0433 12/27/19 0448 12/27/19 1533  BP: 117/60 (!) 146/65 (!) 146/65 122/62  Pulse: 85 87 87 94  Resp: 16 16 16 18   Temp: 98 F (36.7 C) 97.9 F (36.6 C) 97.9 F (36.6 C) 98.2 F (36.8 C)  TempSrc: Oral Oral Oral   SpO2: 98% 100%  97%  Weight:   55.2 kg     Intake/Output Summary (Last 24 hours) at 12/27/2019 1722 Last data filed at 12/27/2019 0400 Gross per 24 hour  Intake 1961.67 ml  Output --  Net 1961.67 ml   Filed Weights   12/27/19 0448  Weight: 55.2 kg    Examination:  General exam: Afebrile, no chest pain, no shortness of breath.  Chronic oxygen supplementation in place in no acute distress.  Reported ongoing abdominal discomfort across abdomen and suprapubic area.  Willing to try clear liquid diets.  Passing gas and expressed small bowel movement overnight. Respiratory system: No using accessory muscles; clear to auscultation bilaterally. Cardiovascular system: Rate controlled, no rubs, no gallops, no JVD. Gastrointestinal system: Abdomen is nondistended, soft and tender to palpation across lower abdomen and suprapubic area.  No guarding.  Positive bowel sounds Central nervous system: Alert and oriented. No focal neurological deficits. Extremities: No cyanosis or clubbing. Skin: No rashes, no petechiae. Psychiatry: Judgement and insight appear normal. Mood & affect appropriate.     Data Reviewed: I have personally reviewed following labs and imaging studies  CBC: Recent Labs  Lab 12/26/19 0110  12/27/19 0724  WBC 5.2 5.7  NEUTROABS 4.0  --   HGB 10.4* 11.0*  HCT 35.3* 35.9*  MCV 91.0 90.0  PLT 256 242    Basic Metabolic Panel: Recent Labs  Lab 12/26/19 0110 12/26/19 0918 12/27/19 0724  NA 133*  --  132*  K 3.4*  --  3.5  CL 90*  --  90*  CO2 33*  --  32  GLUCOSE 102*  --  105*  BUN 12  --  8  CREATININE 0.53  --  0.52  CALCIUM 8.7* 8.5* 9.0  MG  --  1.6*  --   PHOS  --  3.3  --     GFR: Estimated Creatinine Clearance: 47.4 mL/min (by C-G formula based on SCr of 0.52 mg/dL).  Liver Function Tests: Recent Labs  Lab 12/26/19 0110  AST 13*  ALT 11  ALKPHOS 74  BILITOT 0.7  PROT 6.0*  ALBUMIN 3.4*    CBG: Recent Labs  Lab 12/26/19 0945 12/27/19 0723 12/27/19 1120 12/27/19 1637  GLUCAP 89 119* 116* 117*     Recent Results (from the past 240 hour(s))  Urine Culture     Status: Abnormal (Preliminary result)   Collection Time: 12/26/19  1:11 AM   Specimen: Urine, Random  Result Value Ref Range Status   Specimen Description   Final    URINE, RANDOM Performed at [facility], [address], [place], [place]    Special Requests   Final    NONE Performed at [facility], [address] [place], [place]    Culture >=100,000 COLONIES/mL GRAM NEGATIVE  RODS (A)  Final   Report Status PENDING  Incomplete  SARS Coronavirus 2 by RT PCR (hospital order, performed in [facility] hospital lab) Nasopharyngeal Nasopharyngeal Swab     Status: None   Collection Time: 12/26/19  5:32 AM   Specimen: Nasopharyngeal Swab  Result Value Ref Range Status   SARS Coronavirus 2 NEGATIVE NEGATIVE Final    Comment: (NOTE) SARS-CoV-2 target nucleic acids are NOT DETECTED. The SARS-CoV-2 RNA is generally detectable in upper and lower respiratory specimens during the acute phase of infection. The lowest concentration of SARS-CoV-2 viral copies this assay can detect is 250 copies / mL. A negative result does not preclude SARS-CoV-2 infection and  should not be used as the sole basis for treatment or other patient management decisions.  A negative result may occur with improper specimen collection / handling, submission of specimen other than nasopharyngeal swab, presence of viral mutation(s) within the areas targeted by this assay, and inadequate number of viral copies (<250 copies / mL). A negative result must be combined with clinical observations, patient history, and epidemiological information. Fact Sheet for Patients:   BoilerBrush.com.cy Fact Sheet for Healthcare Providers: https://pope.com/ This test is not yet approved or cleared  by the [place] FDA and has been authorized for detection and/or diagnosis of SARS-CoV-2 by FDA under an Emergency Use Authorization (EUA).  This EUA will remain in effect (meaning this test can be used) for the duration of the COVID-19 declaration under Section 564(b)(1) of the Act, 21 U.S.C. section 360bbb-3(b)(1), unless the authorization is terminated or revoked sooner. Performed at [facility], [address] [place], [place] [address]      Radiology Studies: CT ABDOMEN PELVIS W CONTRAST  Addendum Date: 12/26/2019   ADDENDUM REPORT: 12/26/2019 04:35 ADDENDUM: 1 cm left adrenal nodule. In the absence of known cancer history, finding is likely benign such as an adrenal adenoma. Could consider 12 month follow-up adrenal CT to assess for stability. This recommendation follows ACR consensus guidelines: Management of Incidental Adrenal Masses: A White Paper of the ACR Incidental Findings Committee. J Am Coll Radiol 2017;14:1038-1044. Electronically Signed   By: Kreg Shropshire M.D.   On: 12/26/2019 04:35   Result Date: 12/26/2019 CLINICAL DATA:  Right abdominal pain with nausea EXAM: CT ABDOMEN AND PELVIS WITH CONTRAST TECHNIQUE: Multidetector CT imaging of the abdomen and pelvis was performed using the standard protocol following bolus  administration of intravenous contrast. CONTRAST:  OMNIPAQUE IOHEXOL 300 MG/ML  SOLN COMPARISON:  Right hip radiographs 12/16/2019 FINDINGS: Lower chest: Few fluid-filled airways are seen in the right lung base. Mild atelectatic changes. Lung bases otherwise clear without pleural effusion. Normal cardiac size. Likely calcification upon the mitral annulus. No pericardial effusion. Hepatobiliary: Focal fatty infiltration seen along the falciform ligament. Otherwise normal a [place] attenuation concerning focal liver lesion. Smooth liver surface contour. Patient is post cholecystectomy. Slight prominence of the biliary tree likely related to reservoir effect. No calcified intraductal gallstones. Pancreas: Diffuse moderate atrophy of the pancreas without ductal dilatation or peripancreatic inflammation. Spleen: Normal in size without focal abnormality. Adrenals/Urinary Tract: 1 cm nodule in the body of the left adrenal gland, indeterminate (2/13). No right adrenal lesion. Kidneys enhance and excrete symmetrically. Scattered subcentimeter hypoattenuating foci in both kidneys too small to fully characterize on CT imaging but statistically likely benign. No worrisome renal mass. No obstructive urolithiasis or hydronephrosis. There is mild bladder wall thickening with some faint perivesicular stranding. Stomach/Bowel: Fluid-filled moderate hiatal hernia. Distal stomach and duodenum  are unremarkable. Several clustered loops of fluid-filled small bowel seen in the left lower quadrant with associated hazy mesenteric stranding. No evidence of obstruction. Appendix is not visualized. The appendix is surgically absent. High attenuation material throughout the proximal colon may reflect recent ingestion of bismuth containing products. Some mild diffuse pancolonic edematous thickening is noted as well though may be accentuated by underdistention. Scattered colonic diverticula without focal pericolonic inflammation to suggest  diverticulitis. Vascular/Lymphatic: Atherosclerotic calcifications throughout the abdominal aorta and branch vessels. Mild aortic tortuosity. No aneurysm or ectasia. No enlarged abdominopelvic lymph nodes. Reproductive: Uterus is surgically absent. No concerning adnexal lesions. Other: Lentiform 4.4 x 1.9 x 6 cm fluid collection in the left hip likely related to prior left femur ORIF. Mid mesenteric hazy stranding, as above. No abdominopelvic free fluid or air. No bowel containing hernias. Musculoskeletal: The osseous structures appear diffusely demineralized which may limit detection of small or nondisplaced fractures. No acute osseous abnormality or suspicious osseous lesion. Stable appearance of an L1 compression deformity seen on comparison radiographs. Levocurvature of the lumbar spine is similar to priors. Slight rightward lateral listhesis of L2 on L3 is unchanged from comparison radiography. Multilevel discogenic and facet degenerative changes present throughout the lumbar spine. Prior left femoral ORIF. Fragmentation along the greater trochanter may be postsurgical. There is an age-indeterminate fracture of the right greater trochanter. IMPRESSION: 1. Several clustered loops of fluid-filled small bowel in the left lower quadrant as well as diffuse mild pancolonic edematous thickening with associated hazy mesenteric stranding, may represent a nonspecific enterocolitis. 2. Moderate fluid-filled hiatal hernia. Few fluid-filled airways in the right lung base could reflect aspiration. 3. Colonic diverticulosis without evidence of diverticulitis. 4. Lentiform 4.4 x 1.9 x 6 cm fluid collection in the left hip likely related to prior left femur ORIF however sterility is not ascertained by imaging. 5. Age-indeterminate fracture of the right greater trochanter. Recommend correlation with point tenderness. 6. Stable appearance of an L1 compression deformity seen on comparison radiographs. 7. Prior cholecystectomy,  hysterectomy, appendectomy. 8. Aortic Atherosclerosis (ICD10-I70.0). Electronically Signed: By: Kreg Shropshire M.D. On: 12/26/2019 03:21    Scheduled Meds: . acidophilus  2 capsule Oral TID  . docusate sodium  100 mg Oral BID  . heparin  5,000 Units Subcutaneous Q8H  . sodium chloride flush  3 mL Intravenous Q12H   Continuous Infusions: . ciprofloxacin 400 mg (12/27/19 1509)  . dextrose 5 % and 0.45 % NaCl with KCl 20 mEq/L 100 mL/hr at 12/27/19 0427  . metronidazole 500 mg (12/27/19 1130)     LOS: 1 day    Time spent: 30 minutes.    Vassie Loll, MD [facility]   To contact the attending provider between 7A-7P or the covering provider during after hours 7P-7A, please log into the web site [email or web address] and access using universal [place] password for that web site. If you do not have the password, please call the hospital operator.  12/27/2019, 5:22 PM

## 2019-12-28 LAB — GLUCOSE, CAPILLARY: Glucose-Capillary: 114 mg/dL — ABNORMAL HIGH (ref 70–99)

## 2019-12-28 LAB — BASIC METABOLIC PANEL
Anion gap: 8 (ref 5–15)
BUN: 5 mg/dL — ABNORMAL LOW (ref 8–23)
CO2: 33 mmol/L — ABNORMAL HIGH (ref 22–32)
Calcium: 9 mg/dL (ref 8.9–10.3)
Chloride: 94 mmol/L — ABNORMAL LOW (ref 98–111)
Creatinine, Ser: 0.52 mg/dL (ref 0.44–1.00)
GFR calc Af Amer: >60 mL/min (ref >60)
GFR calc non Af Amer: >60 mL/min (ref >60)
Glucose, Bld: 105 mg/dL — ABNORMAL HIGH (ref 70–99)
Potassium: 3.8 mmol/L (ref 3.5–5.1)
Sodium: 135 mmol/L (ref 135–145)

## 2019-12-28 LAB — CBC
HCT: 36.2 % (ref 36.0–46.0)
Hemoglobin: 10.9 g/dL — ABNORMAL LOW (ref 12.0–15.0)
MCH: 27.4 pg (ref 26.0–34.0)
MCHC: 30.1 g/dL (ref 30.0–36.0)
MCV: 91 fL (ref 80.0–100.0)
Platelets: 249 10*3/uL (ref 150–400)
RBC: 3.98 MIL/uL (ref 3.87–5.11)
RDW: 15.7 % — ABNORMAL HIGH (ref 11.5–15.5)
WBC: 6.9 10*3/uL (ref 4.0–10.5)
nRBC: 0 % (ref 0.0–0.2)

## 2019-12-28 LAB — URINE CULTURE: Culture: >=100000 — AB

## 2019-12-28 MED ORDER — LINACLOTIDE 145 MCG PO CAPS
145.0000 ug | ORAL_CAPSULE | Freq: Every day | ORAL | Status: DC
  Administered 2019-12-29 – 2020-01-03 (×3): 145 ug via ORAL
  Filled 2019-12-28 (×6): qty 1

## 2019-12-28 MED ORDER — HYOSCYAMINE SULFATE 0.125 MG SL SUBL: 0.1250 mg | SUBLINGUAL_TABLET | Freq: Three times a day (TID) | SUBLINGUAL | Status: DC | PRN

## 2019-12-28 MED ORDER — CLONAZEPAM 0.25 MG PO TBDP
0.2500 mg | ORAL_TABLET | Freq: Two times a day (BID) | ORAL | Status: DC | PRN
  Administered 2019-12-28 – 2020-01-01 (×4): 0.25 mg via ORAL
  Filled 2019-12-28 (×5): qty 1

## 2019-12-28 NOTE — Progress Notes (Signed)
PROGRESS NOTE    Sharon Jordan  FFM:[id number] DOB: July 31, 1946 DOA: 12/26/2019 PCP: [name], MD    Chief Complaint  Patient presents with  . Abdominal Pain    Brief Narrative:  As per H&P written by Dr. Flossie Dibble on 12/26/2019 74 y.o. 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  Presented: With abdominal associate with nausea vomiting unable to tolerate solids or liquids.  Reporting diffuse abdominal pain worsening over the past 24 hours pain is most consistent in epigastric area and right area does not radiate to the back.   Not associate with any fever chills more nausea some vomiting not associate with diarrhea or constipation.  Reporting a mild dysuria, with no discharge or foul odor.   Patient Denies having: Fever, Chills, Cough, SOB, Chest Pain,, headache, dizziness, lightheadedness, Joint pain, rash, open wounds  ED Course:   ED evaluation hemodynamically stable, mildly hypertensive, afebrile CBC CMP within normal limits exception of hemoglobin 10.4 hematocrit 35.3, sodium 133 potassium 3.4, BUN 12, creatinine 0.53, calcium 8.7, glucose 102, lactic acid 0.7, 0.5, UA consistent with nitrites, CT of abdomen: Reviewed acute on chronic changes were noted along with acute finding consistent with pancolonic enteric colitis IMPRESSION: 1. Several clustered loops of fluid-filled small bowel in the left lower quadrant as well as diffuse mild pancolonic edematous thickening with associated hazy mesenteric stranding, may represent a nonspecific enterocolitis. 2. Moderate fluid-filled hiatal hernia. Few fluid-filled airways in the right lung base could reflect aspiration. 3. Colonic diverticulosis without evidence of diverticulitis. 4. Lentiform 4.4 x 1.9 x 6 cm fluid  collection in the left hip likely related to prior left femur ORIF however sterility is not ascertained by imaging. 5. Age-indeterminate fracture of the right greater trochanter. Recommend correlation with point tenderness. 6. Stable appearance of an L1 compression deformity seen on comparison radiographs. 7. Prior cholecystectomy, hysterectomy, appendectomy. 8. Aortic Atherosclerosis (ICD10-I70.0).  Assessment & Plan: 1-enterocolitis -Presumed infections in the origin -Follow cultures -Continue IV antibiotics -Slowly advance diet -Continue as needed analgesics and antiemetics -Follow clinical response. -Continue IV fluids and supportive care.  2-Intertrochanteric fracture of left femur (HCC)  -No signs of acute disalignment or fracture -Chronic changes associated with previous surgery. -Continue as needed analgesics -PT evaluation will be requested.  3-Hypertension -Overall stable and well-controlled -Continue to follow vital signs -Holding antihypertensive agents currently.  4-chronic respiratory failure due to COPD (chronic obstructive pulmonary disease) (HCC) -No wheezing or shortness of breath -Continue oxygen supplementation -Continue as needed bronchodilators.  5-history of depression/anxiety -Most recent ideation hallucination -Continue as needed anxiolytics.  6-GERD (gastroesophageal reflux disease) -Currently without any reflux symptoms -Holding PPIs for receiving antibiotics -If needed will add famotidine.  7-RLS (restless legs syndrome) -Continue the use of Requip  8-history of osteoporosis -Continue Fosamax after discharge.  9-constipation -will start patient on linzess.    DVT prophylaxis: Heparin Code Status: Full code Family Communication: No family at bedside. Disposition:   Status is: Inpatient  Dispo: The patient is from: home              Anticipated d/c is to: home              Anticipated d/c date is: to be determined                Patient currently No medically ready for  discharge; still having significant abdominal pain requiring IV analgesics and barely tolerating CLD. Will continue IV antibiotics, continue to slowly advance diet.  Physical therapy consultation in place.        Consultants:   None   Procedures:   See below for x-ray reports   Antimicrobials:   Ciprofloxacin and Flagyl   Subjective: No fever, no nausea vomiting.  Continue complaining of abdominal pain and suprapubic discomfort.  Reports no bowel movement since Wednesday prior to admission (12/24/2019).  Objective: Vitals:   12/27/19 2147 12/28/19 0500 12/28/19 0514 12/28/19 1357  BP: (!) 131/53  (!) 131/56 (!) 129/55  Pulse: 85  86 78  Resp: 18  18   Temp: 98.6 F (37 C)  98.6 F (37 C) 99.1 F (37.3 C)  TempSrc: Oral  Oral Oral  SpO2: 100%  98% 95%  Weight:  55 kg      Intake/Output Summary (Last 24 hours) at 12/28/2019 1757 Last data filed at 12/28/2019 1500 Gross per 24 hour  Intake 480 ml  Output 750 ml  Net -270 ml   Filed Weights   12/27/19 0448 12/28/19 0500  Weight: 55.2 kg 55 kg    Examination: General exam: Alert, awake, oriented x 3; chronically ill, frail and weak.  Continue reporting abdominal pain.  No nausea vomiting.  So far consolidate clear liquid diet.  Patient is afebrile. Respiratory system: Clear to auscultation. Respiratory effort normal. Cardiovascular system:RRR. No rubs or gallops; no JVD. Gastrointestinal system: Abdomen is nondistended, soft and tender to palpation in lower aspect of suprapubic area.  No guarding.  Positive bowel sounds. Central nervous system: Alert and oriented. No focal neurological deficits. Extremities: No cyanosis or clubbing. Skin: No rashes, no petechiae. Psychiatry: Judgement and insight appear normal. Mood & affect appropriate.    Data Reviewed: I have personally reviewed following labs and imaging studies  CBC: Recent Labs  Lab 12/26/19 0110  12/27/19 0724 12/28/19 0727  WBC 5.2 5.7 6.9  NEUTROABS 4.0  --   --   HGB 10.4* 11.0* 10.9*  HCT 35.3* 35.9* 36.2  MCV 91.0 90.0 91.0  PLT 256 242 249    Basic Metabolic Panel: Recent Labs  Lab 12/26/19 0110 12/26/19 0918 12/27/19 0724 12/28/19 0727  NA 133*  --  132* 135  K 3.4*  --  3.5 3.8  CL 90*  --  90* 94*  CO2 33*  --  32 33*  GLUCOSE 102*  --  105* 105*  BUN 12  --  8 5*  CREATININE 0.53  --  0.52 0.52  CALCIUM 8.7* 8.5* 9.0 9.0  MG  --  1.6*  --   --   PHOS  --  3.3  --   --     GFR: Estimated Creatinine Clearance: 47.3 mL/min (by C-G formula based on SCr of 0.52 mg/dL).  Liver Function Tests: Recent Labs  Lab 12/26/19 0110  AST 13*  ALT 11  ALKPHOS 74  BILITOT 0.7  PROT 6.0*  ALBUMIN 3.4*    CBG: Recent Labs  Lab 12/26/19 0945 12/27/19 0723 12/27/19 1120 12/27/19 1637 12/28/19 0746  GLUCAP 89 119* 116* 117* 114*     Recent Results (from the past 240 hour(s))  Urine Culture     Status: Abnormal   Collection Time: 12/26/19  1:11 AM   Specimen: Urine, Random  Result Value Ref Range Status   Specimen Description   Final    URINE, RANDOM Performed at [facility], [address] [place], [place] [address]    Special Requests   Final    NONE Performed at [facility], [address] [place], [place] [address]    Culture (A)  Final    >=100,000 COLONIES/mL KLEBSIELLA PNEUMONIAE Two isolates with different morphologies were identified as the same organism.The most resistant organism was reported. Performed at [facility], [address] [place], [place] [address]    Report Status 12/28/2019 FINAL  Final   Organism ID, Bacteria KLEBSIELLA PNEUMONIAE (A)  Final      Susceptibility   Klebsiella pneumoniae - MIC*    AMPICILLIN >=32 RESISTANT Resistant     CEFAZOLIN <=4 SENSITIVE Sensitive     CEFTRIAXONE <=1 SENSITIVE Sensitive     CIPROFLOXACIN <=0.25 SENSITIVE Sensitive     GENTAMICIN <=1 SENSITIVE Sensitive     IMIPENEM 1  SENSITIVE Sensitive     NITROFURANTOIN 64 INTERMEDIATE Intermediate     TRIMETH/SULFA <=20 SENSITIVE Sensitive     AMPICILLIN/SULBACTAM 4 SENSITIVE Sensitive     PIP/TAZO <=4 SENSITIVE Sensitive     * >=100,000 COLONIES/mL KLEBSIELLA PNEUMONIAE  SARS Coronavirus 2 by RT PCR (hospital order, performed in [facility] hospital lab) Nasopharyngeal Nasopharyngeal Swab     Status: None   Collection Time: 12/26/19  5:32 AM   Specimen: Nasopharyngeal Swab  Result Value Ref Range Status   SARS Coronavirus 2 NEGATIVE NEGATIVE Final    Comment: (NOTE) SARS-CoV-2 target nucleic acids are NOT DETECTED. The SARS-CoV-2 RNA is generally detectable in upper and lower respiratory specimens during the acute phase of infection. The lowest concentration of SARS-CoV-2 viral copies this assay can detect is 250 copies / mL. A negative result does not preclude SARS-CoV-2 infection and should not be used as the sole basis for treatment or other patient management decisions.  A negative result may occur with improper specimen collection / handling, submission of specimen other than nasopharyngeal swab, presence of viral mutation(s) within the areas targeted by this assay, and inadequate number of viral copies (<250 copies / mL). A negative result must be combined with clinical observations, patient history, and epidemiological information. Fact Sheet for Patients:   BoilerBrush.com.cy Fact Sheet for Healthcare Providers: https://pope.com/ This test is not yet approved or cleared  by the [place] FDA and has been authorized for detection and/or diagnosis of SARS-CoV-2 by FDA under an Emergency Use Authorization (EUA).  This EUA will remain in effect (meaning this test can be used) for the duration of the COVID-19 declaration under Section 564(b)(1) of the Act, 21 U.S.C. section 360bbb-3(b)(1), unless the authorization is terminated or revoked  sooner. Performed at [facility], [address] [place], [place] [address]      Radiology Studies: No results found.  Scheduled Meds: . acidophilus  2 capsule Oral TID  . docusate sodium  100 mg Oral BID  . heparin  5,000 Units Subcutaneous Q8H  . [START ON 12/29/2019] linaclotide  145 mcg Oral QAC breakfast  . sodium chloride flush  3 mL Intravenous Q12H   Continuous Infusions: . ciprofloxacin 400 mg (12/28/19 1505)  . dextrose 5 % and 0.45 % NaCl with KCl 20 mEq/L 100 mL/hr at 12/28/19 0940  . metronidazole 500 mg (12/28/19 1200)     LOS: 2 days    Time spent: 30 minutes.    Vassie Loll, MD [facility]   To contact the attending provider between 7A-7P or the covering provider during after hours 7P-7A, please log into the web site  [email or web address] and access using universal  password for that web site. If you do not have the password, please call the hospital operator.  12/28/2019, 5:57 PM

## 2019-12-29 LAB — CBC
HCT: 33.8 % — ABNORMAL LOW (ref 36.0–46.0)
Hemoglobin: 10.2 g/dL — ABNORMAL LOW (ref 12.0–15.0)
MCH: 27.6 pg (ref 26.0–34.0)
MCHC: 30.2 g/dL (ref 30.0–36.0)
MCV: 91.6 fL (ref 80.0–100.0)
Platelets: 231 10*3/uL (ref 150–400)
RBC: 3.69 MIL/uL — ABNORMAL LOW (ref 3.87–5.11)
RDW: 15.9 % — ABNORMAL HIGH (ref 11.5–15.5)
WBC: 5.6 10*3/uL (ref 4.0–10.5)
nRBC: 0 % (ref 0.0–0.2)

## 2019-12-29 LAB — BASIC METABOLIC PANEL
Anion gap: 7 (ref 5–15)
BUN: <5 mg/dL — ABNORMAL LOW (ref 8–23)
CO2: 33 mmol/L — ABNORMAL HIGH (ref 22–32)
Calcium: 8.9 mg/dL (ref 8.9–10.3)
Chloride: 97 mmol/L — ABNORMAL LOW (ref 98–111)
Creatinine, Ser: 0.54 mg/dL (ref 0.44–1.00)
GFR calc Af Amer: >60 mL/min (ref >60)
GFR calc non Af Amer: >60 mL/min (ref >60)
Glucose, Bld: 83 mg/dL (ref 70–99)
Potassium: 4.3 mmol/L (ref 3.5–5.1)
Sodium: 137 mmol/L (ref 135–145)

## 2019-12-29 NOTE — Plan of Care (Signed)
  Problem: Acute Rehab PT Goals(only PT should resolve) Goal: Pt Will Go Supine/Side To Sit Outcome: Progressing Flowsheets (Taken 12/29/2019 0934) Pt will go Supine/Side to Sit: with supervision Goal: Pt Will Go Sit To Supine/Side Outcome: Progressing Flowsheets (Taken 12/29/2019 0934) Pt will go Sit to Supine/Side: with supervision Goal: Patient Will Transfer Sit To/From Stand Outcome: Progressing Flowsheets (Taken 12/29/2019 0934) Patient will transfer sit to/from stand: with minimal assist Goal: Pt Will Transfer Bed To Chair/Chair To Bed Outcome: Progressing Flowsheets (Taken 12/29/2019 0934) Pt will Transfer Bed to Chair/Chair to Bed: with min assist Goal: Pt Will Ambulate Outcome: Progressing Flowsheets (Taken 12/29/2019 0934) Pt will Ambulate:  15 feet  with minimal assist  with rolling walker  9:35 AM, 12/29/19 Wyman Songster PT, DPT Physical Therapist at [facility]

## 2019-12-29 NOTE — Evaluation (Signed)
Occupational Therapy Evaluation Patient Details Name: Sharon Jordan MRN: [id number] DOB: 02-08-1946 Today's Date: 12/29/2019    History of Present Illness Sharon Jordan is a 73 y.o. 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 Presented: With abdominal associate with nausea vomiting unable to tolerate solids or liquids.  Reporting diffuse abdominal pain worsening over the past 24 hours pain is most consistent in epigastric area and right area does not radiate to the back.    Clinical Impression   Pt agreeable to OT/PT co-evaluation, pain medication provided prior to mobility. Pt reports constipation since Wednesday and continued abdominal pain limiting mobility. Pt with generalized weakness, requiring increased assistance with ADLs due to pain and weakness causing mobility and activity tolerance limitations. Recommend SNF on discharge to improve safety and independence during ADL completion and mobility tasks. No further acute OT services required at this time.      Follow Up Recommendations  SNF    Equipment Recommendations  None recommended by OT       Precautions / Restrictions Precautions Precautions: Fall Restrictions Weight Bearing Restrictions: No      Mobility Bed Mobility Overal bed mobility: Needs Assistance Bed Mobility: Supine to Sit     Supine to sit: Min assist;HOB elevated        Transfers Overall transfer level: Needs assistance               General transfer comment: Defer to PT note        ADL either performed or assessed with clinical judgement   ADL Overall ADL's : Needs assistance/impaired Eating/Feeding: Modified independent;Sitting   Grooming: Wash/dry hands;Set up;Sitting Grooming Details (indicate cue type and reason): pt  unable to stand at sink due to pain         Upper Body Dressing : Minimal assistance;Sitting Upper Body Dressing Details (indicate cue type and reason): min assist to manage gown ties/buttons Lower Body Dressing: Maximal assistance;Sitting/lateral leans Lower Body Dressing Details (indicate cue type and reason): donning socks at EOB, pt unable to bend over to reach feet due to pain Toilet Transfer: Minimal assistance;Stand-pivot;RW Toilet Transfer Details (indicate cue type and reason): simulated with bed to chair transfer           General ADL Comments: pt requiring increased assistance with ADLs due to pain and generalized weakness     Vision Baseline Vision/History: Wears glasses Wears Glasses: Reading only Patient Visual Report: No change from baseline Vision Assessment?: No apparent visual deficits            Pertinent Vitals/Pain Pain Assessment: 0-10 Pain Score: 9  Pain Location: abdomen Pain Descriptors / Indicators: Constant;Grimacing Pain Intervention(s): Limited activity within patient's tolerance;Monitored during session;RN gave pain meds during session;Repositioned     Hand Dominance Right   Extremity/Trunk Assessment Upper Extremity Assessment Upper Extremity Assessment: Generalized weakness   Lower Extremity Assessment Lower Extremity Assessment: Defer to PT evaluation   Cervical / Trunk Assessment Cervical / Trunk Assessment: Kyphotic   Communication Communication Communication: No difficulties   Cognition Arousal/Alertness: Awake/alert Behavior During Therapy: WFL for tasks assessed/performed Overall Cognitive Status: Within Functional Limits for tasks assessed  Home Living Family/patient expects to be discharged to:: Private residence Living Arrangements: Children Available Help at Discharge: Family;Available 24 hours/day Type of Home: House Home Access: Ramped entrance      Home Layout: One level     Bathroom Shower/Tub: Chief Strategy Officer: Standard     Home Equipment: Environmental consultant - 2 wheels;Cane - single point;Bedside commode;Shower seat;Grab bars - tub/shower;Wheelchair - manual          Prior Functioning/Environment Level of Independence: Needs assistance  Gait / Transfers Assistance Needed: Pt using RW for mobility PTA ADL's / Homemaking Assistance Needed: Family assists with ADLs as needed-pt doesn't like to ask for help with meals. Pt reports independence in dressing, family assists with bathing tasks            OT Problem List: Decreased strength;Decreased activity tolerance;Impaired balance (sitting and/or standing);Decreased safety awareness;Decreased knowledge of use of DME or AE;Pain                       Co-evaluation PT/OT/SLP Co-Evaluation/Treatment: Yes Reason for Co-Treatment: Complexity of the patient's impairments (multi-system involvement)   OT goals addressed during session: ADL's and self-care;Proper use of Adaptive equipment and DME         End of Session Equipment Utilized During Treatment: Gait belt;Rolling walker;Oxygen  Activity Tolerance: Patient tolerated treatment well Patient left: in chair;with call bell/phone within reach;with chair alarm set  OT Visit Diagnosis: Muscle weakness (generalized) (M62.81);Pain Pain - Right/Left: (lower) Pain - part of body: (abdomen)                Time: 0160-1093 OT Time Calculation (min): 33 min Charges:  OT General Charges $OT Visit: 1 Visit OT Evaluation $OT Eval Moderate Complexity: 1 [address] OTR/L  [phone] 12/29/2019, 8:57 AM

## 2019-12-29 NOTE — Evaluation (Signed)
Physical Therapy Evaluation Patient Details Name: Sharon Jordan MRN: [id number] DOB: 09/30/1945 Today's Date: 12/29/2019   History of Present Illness  Sharon Jordan is a 74 y.o. 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 Presented: With abdominal associate with nausea vomiting unable to tolerate solids or liquids.  Reporting diffuse abdominal pain worsening over the past 24 hours pain is most consistent in epigastric area and right area does not radiate to the back.     Clinical Impression  Patient limited for functional mobility as stated below secondary to BLE weakness, fatigue and poor standing balance. Patient c/o abdominal pain throughout session, RN gave pain meds during session. Patient requires min assist for bed mobility and has c/o lightheadedness upon sitting. She demonstrates good sitting tolerance EOB. She requires min/mod assist for transfers and gait. She moves with slow, labored movements and requires verbal cueing for sequencing. Patient tolerates sitting in chair at end of session. Patient will benefit from continued physical therapy in hospital and recommended venue below to increase strength, balance, endurance for safe ADLs and gait.     Follow Up Recommendations SNF    Equipment Recommendations  None recommended by PT    Recommendations for Other Services       Precautions / Restrictions Precautions Precautions: Fall Restrictions Weight Bearing Restrictions: No      Mobility  Bed Mobility Overal bed mobility: Needs Assistance Bed Mobility: Supine to Sit     Supine to sit: Min assist;HOB elevated     General bed mobility comments: slow, labored, verbal cueing for sequencing, physical assist to pull to seated and for scooting  Transfers Overall  transfer level: Needs assistance Equipment used: Rolling walker (2 wheeled) Transfers: Sit to/from [employer] Sit to Stand: Min assist;Mod assist Stand pivot transfers: Min assist;Mod assist       General transfer comment: Slow, labored, verbal cueing for sequencing and RW use  Ambulation/Gait Ambulation/Gait assistance: Min assist;Mod assist Gait Distance (Feet): 3 Feet Assistive device: Rolling walker (2 wheeled) Gait Pattern/deviations: Shuffle Gait velocity: decreased   General Gait Details: limited to several steps at bedside to chair with RW  Stairs            Wheelchair Mobility    Modified Rankin (Stroke Patients Only)       Balance Overall balance assessment: Needs assistance Sitting-balance support: No upper extremity supported;Feet supported Sitting balance-Leahy Scale: Fair Sitting balance - Comments: seated EOB   Standing balance support: Bilateral upper extremity supported Standing balance-Leahy Scale: Poor Standing balance comment: requires RW and assist                             Pertinent Vitals/Pain Pain Assessment: 0-10 Pain Score: 9  Pain Location: abdomen Pain Descriptors / Indicators: Constant;Grimacing Pain Intervention(s): Limited activity within patient's tolerance;Monitored during session;RN gave pain meds during session;Repositioned    Home Living Family/patient expects to be discharged to:: Private residence Living Arrangements: Children Available Help at Discharge: Family;Available 24 hours/day Type of Home: House Home Access: Ramped entrance     Home Layout: One level Home Equipment: Walker - 2 wheels;Cane - single point;Bedside commode;Shower seat;Grab bars - tub/shower;Wheelchair - manual      Prior Function Level of Independence: Needs assistance   Gait / Transfers Assistance Needed: Pt  using RW for mobility PTA, household ambulator with RW  ADL's / Homemaking Assistance Needed: Family  assists with ADLs as needed-pt doesn't like to ask for help with meals. Pt reports independence in dressing, family assists with bathing tasks  Comments: Patient states she needs more assistance than what is provided to her     Hand Dominance   Dominant Hand: Right    Extremity/Trunk Assessment   Upper Extremity Assessment Upper Extremity Assessment: Defer to OT evaluation    Lower Extremity Assessment Lower Extremity Assessment: Generalized weakness    Cervical / Trunk Assessment Cervical / Trunk Assessment: Kyphotic  Communication   Communication: No difficulties  Cognition Arousal/Alertness: Awake/alert Behavior During Therapy: WFL for tasks assessed/performed Overall Cognitive Status: Within Functional Limits for tasks assessed                                        General Comments      Exercises     Assessment/Plan    PT Assessment Patient needs continued PT services  PT Problem List Decreased strength;Decreased mobility;Decreased activity tolerance;Decreased balance;Pain       PT Treatment Interventions DME instruction;Therapeutic exercise;Gait training;Balance training;Stair training;Neuromuscular re-education;Functional mobility training;Therapeutic activities;Patient/family education    PT Goals (Current goals can be found in the Care Plan section)  Acute Rehab PT Goals Patient Stated Goal: get stronger PT Goal Formulation: With patient Time For Goal Achievement: 01/10/20 Potential to Achieve Goals: Fair    Frequency Min 3X/week   Barriers to discharge        Co-evaluation PT/OT/SLP Co-Evaluation/Treatment: Yes Reason for Co-Treatment: Complexity of the patient's impairments (multi-system involvement);For patient/therapist safety;To address functional/ADL transfers PT goals addressed during session: Mobility/safety with mobility;Proper use of DME;Strengthening/ROM OT goals addressed during session: ADL's and self-care;Proper use  of Adaptive equipment and DME       AM-PAC PT "6 Clicks" Mobility  Outcome Measure Help needed turning from your back to your side while in a flat bed without using bedrails?: None Help needed moving from lying on your back to sitting on the side of a flat bed without using bedrails?: A Little Help needed moving to and from a bed to a chair (including a wheelchair)?: A Lot Help needed standing up from a chair using your arms (e.g., wheelchair or bedside chair)?: A Lot Help needed to walk in hospital room?: A Lot Help needed climbing 3-5 steps with a railing? : Total 6 Click Score: 14    End of Session Equipment Utilized During Treatment: Gait belt;Oxygen Activity Tolerance: Patient limited by pain;Patient limited by fatigue Patient left: in chair;with call bell/phone within reach;with chair alarm set Nurse Communication: Mobility status PT Visit Diagnosis: Unsteadiness on feet (R26.81);Other abnormalities of gait and mobility (R26.89);Muscle weakness (generalized) (M62.81)    Time: 5009-3818 PT Time Calculation (min) (ACUTE ONLY): 33 min   Charges:   PT Evaluation $PT Eval Moderate Complexity: 1 Mod          9:33 AM, 12/29/19 Mearl Latin PT, DPT Physical Therapist at [facility]

## 2019-12-29 NOTE — Progress Notes (Signed)
PROGRESS NOTE    Sharon Jordan  WUJ:[id number] DOB: 08/27/1945 DOA: 12/26/2019 PCP: [name], MD    Chief Complaint  Patient presents with  . Abdominal Pain    Brief Narrative:  As per H&P written by Dr. Flossie Dibble on 12/26/2019 74 y.o. 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  Presented: With abdominal associate with nausea vomiting unable to tolerate solids or liquids.  Reporting diffuse abdominal pain worsening over the past 24 hours pain is most consistent in epigastric area and right area does not radiate to the back.   Not associate with any fever chills more nausea some vomiting not associate with diarrhea or constipation.  Reporting a mild dysuria, with no discharge or foul odor.   Patient Denies having: Fever, Chills, Cough, SOB, Chest Pain,, headache, dizziness, lightheadedness, Joint pain, rash, open wounds  ED Course:   ED evaluation hemodynamically stable, mildly hypertensive, afebrile CBC CMP within normal limits exception of hemoglobin 10.4 hematocrit 35.3, sodium 133 potassium 3.4, BUN 12, creatinine 0.53, calcium 8.7, glucose 102, lactic acid 0.7, 0.5, UA consistent with nitrites, CT of abdomen: Reviewed acute on chronic changes were noted along with acute finding consistent with pancolonic enteric colitis IMPRESSION: 1. Several clustered loops of fluid-filled small bowel in the left lower quadrant as well as diffuse mild pancolonic edematous thickening with associated hazy mesenteric stranding, may represent a nonspecific enterocolitis. 2. Moderate fluid-filled hiatal hernia. Few fluid-filled airways in the right lung base could reflect aspiration. 3. Colonic diverticulosis without evidence of diverticulitis. 4. Lentiform 4.4 x 1.9 x 6 cm fluid  collection in the left hip likely related to prior left femur ORIF however sterility is not ascertained by imaging. 5. Age-indeterminate fracture of the right greater trochanter. Recommend correlation with point tenderness. 6. Stable appearance of an L1 compression deformity seen on comparison radiographs. 7. Prior cholecystectomy, hysterectomy, appendectomy. 8. Aortic Atherosclerosis (ICD10-I70.0).  Assessment & Plan: 1-enterocolitis/Klebsiella UTI -Microorganism resistant to ampicillin. -Continue current IV antibiotics, hopefully able to transition to oral regimen in the next 24-48 hours. -Continue slowly advancing diet. -Continue as needed analgesics and antiemetics -Follow clinical response. -Continue IV fluids and supportive care.  2-Intertrochanteric fracture of left femur (HCC)  -No signs of acute disalignment or fracture -Chronic changes associated with previous surgery. -Continue as needed analgesics -PT evaluation appreciated; recommendations given for skilled nursing facility discharge.  3-Hypertension -Overall stable and well-controlled -Continue to follow vital signs -Holding antihypertensive agents currently.  4-chronic respiratory failure due to COPD (chronic obstructive pulmonary disease) (HCC) -No wheezing or shortness of breath -Continue oxygen supplementation -Continue as needed bronchodilators.  5-history of depression/anxiety -Most recent ideation hallucination -Continue as needed anxiolytics.  6-GERD (gastroesophageal reflux disease) -Currently without any reflux symptoms -Holding PPIs for receiving antibiotics -will add famotidine.  7-RLS (restless legs syndrome) -Continue the use of Requip  8-history of osteoporosis -Continue Fosamax after discharge.  9-constipation -will continue linzess.    DVT prophylaxis: Heparin Code Status: Full code Family Communication: No family at bedside. Disposition:   Status is: Inpatient  Dispo: The  patient is from: home              Anticipated d/c is to: home              Anticipated d/c date is: to be determined  Patient currently No medically ready for discharge; still having significant abdominal pain requiring IV analgesics and requiring IV antibiotics. Will continue IV antibiotics, continue to slowly advance diet.  Physical therapy consultation appreciated; recommendations given for skilled nursing facility at discharge for further care and rehabilitation.     Consultants:   None   Procedures:   See below for x-ray reports   Antimicrobials:   Ciprofloxacin and Flagyl   Subjective: Still having abdominal pain, reports no nausea or vomiting.  Patient is afebrile.  Expressed she feels like having a BM soon.  Objective: Vitals:   12/28/19 1357 12/28/19 2013 12/28/19 2153 12/29/19 0539  BP: (!) 129/55  (!) 127/56 130/60  Pulse: 78  80 82  Resp:   18 18  Temp: 99.1 F (37.3 C)  99.1 F (37.3 C) 98.9 F (37.2 C)  TempSrc: Oral  Oral Oral  SpO2: 95% 96% 100% 100%  Weight:    56 kg    Intake/Output Summary (Last 24 hours) at 12/29/2019 1453 Last data filed at 12/29/2019 1300 Gross per 24 hour  Intake 1323 ml  Output 1900 ml  Net -577 ml   Filed Weights   12/27/19 0448 12/28/19 0500 12/29/19 0539  Weight: 55.2 kg 55 kg 56 kg    Examination: General exam: Alert, awake, oriented x 3; chronically ill, frail and weak on assessment.  Still reporting intermittent abdominal discomfort, but expressed getting better and no having any nausea vomiting.  Will like diet to be advanced. Respiratory system: Clear to auscultation. Respiratory effort normal.  2 L nasal cannula in place. Cardiovascular system: RRR. No murmurs, rubs, gallops. Gastrointestinal system: Abdomen is nondistended, soft and tender to palpation lower quadrants bilaterally and suprapubic area.  No guarding.  Positive bowel sounds.   Central nervous system: Alert and oriented. No focal  neurological deficits. Extremities: No C/C/E, +pedal pulses Skin: No rashes, no petechiae. Psychiatry: Judgement and insight appear normal. Mood & affect appropriate.    Data Reviewed: I have personally reviewed following labs and imaging studies  CBC: Recent Labs  Lab 12/26/19 0110 12/27/19 0724 12/28/19 0727 12/29/19 0517  WBC 5.2 5.7 6.9 5.6  NEUTROABS 4.0  --   --   --   HGB 10.4* 11.0* 10.9* 10.2*  HCT 35.3* 35.9* 36.2 33.8*  MCV 91.0 90.0 91.0 91.6  PLT 256 242 249 614    Basic Metabolic Panel: Recent Labs  Lab 12/26/19 0110 12/26/19 0918 12/27/19 0724 12/28/19 0727 12/29/19 0517  NA 133*  --  132* 135 137  K 3.4*  --  3.5 3.8 4.3  CL 90*  --  90* 94* 97*  CO2 33*  --  32 33* 33*  GLUCOSE 102*  --  105* 105* 83  BUN 12  --  8 5* <5*  CREATININE 0.53  --  0.52 0.52 0.54  CALCIUM 8.7* 8.5* 9.0 9.0 8.9  MG  --  1.6*  --   --   --   PHOS  --  3.3  --   --   --     GFR: Estimated Creatinine Clearance: 47.7 mL/min (by C-G formula based on SCr of 0.54 mg/dL).  Liver Function Tests: Recent Labs  Lab 12/26/19 0110  AST 13*  ALT 11  ALKPHOS 74  BILITOT 0.7  PROT 6.0*  ALBUMIN 3.4*    CBG: Recent Labs  Lab 12/26/19 0945 12/27/19 0723 12/27/19 1120 12/27/19 1637 12/28/19 0746  GLUCAP 89 119* 116* 117* 114*  Recent Results (from the past 240 hour(s))  Urine Culture     Status: Abnormal   Collection Time: 12/26/19  1:11 AM   Specimen: Urine, Random  Result Value Ref Range Status   Specimen Description   Final    URINE, RANDOM Performed at [facility], [address], [place], [place] [address]    Special Requests   Final    NONE Performed at [facility], [address] [place], [place] [address]    Culture (A)  Final    >=100,000 COLONIES/mL KLEBSIELLA PNEUMONIAE Two isolates with different morphologies were identified as the same organism.The most resistant organism was reported. Performed at [facility], [address],  [place], [place] [address]    Report Status 12/28/2019 FINAL  Final   Organism ID, Bacteria KLEBSIELLA PNEUMONIAE (A)  Final      Susceptibility   Klebsiella pneumoniae - MIC*    AMPICILLIN >=32 RESISTANT Resistant     CEFAZOLIN <=4 SENSITIVE Sensitive     CEFTRIAXONE <=1 SENSITIVE Sensitive     CIPROFLOXACIN <=0.25 SENSITIVE Sensitive     GENTAMICIN <=1 SENSITIVE Sensitive     IMIPENEM 1 SENSITIVE Sensitive     NITROFURANTOIN 64 INTERMEDIATE Intermediate     TRIMETH/SULFA <=20 SENSITIVE Sensitive     AMPICILLIN/SULBACTAM 4 SENSITIVE Sensitive     PIP/TAZO <=4 SENSITIVE Sensitive     * >=100,000 COLONIES/mL KLEBSIELLA PNEUMONIAE  SARS Coronavirus 2 by RT PCR (hospital order, performed in [facility] hospital lab) Nasopharyngeal Nasopharyngeal Swab     Status: None   Collection Time: 12/26/19  5:32 AM   Specimen: Nasopharyngeal Swab  Result Value Ref Range Status   SARS Coronavirus 2 NEGATIVE NEGATIVE Final    Comment: (NOTE) SARS-CoV-2 target nucleic acids are NOT DETECTED. The SARS-CoV-2 RNA is generally detectable in upper and lower respiratory specimens during the acute phase of infection. The lowest concentration of SARS-CoV-2 viral copies this assay can detect is 250 copies / mL. A negative result does not preclude SARS-CoV-2 infection and should not be used as the sole basis for treatment or other patient management decisions.  A negative result may occur with improper specimen collection / handling, submission of specimen other than nasopharyngeal swab, presence of viral mutation(s) within the areas targeted by this assay, and inadequate number of viral copies (<250 copies / mL). A negative result must be combined with clinical observations, patient history, and epidemiological information. Fact Sheet for Patients:   BoilerBrush.com.cy Fact Sheet for Healthcare Providers: https://pope.com/ This test is not yet approved or cleared   by the [place] FDA and has been authorized for detection and/or diagnosis of SARS-CoV-2 by FDA under an Emergency Use Authorization (EUA).  This EUA will remain in effect (meaning this test can be used) for the duration of the COVID-19 declaration under Section 564(b)(1) of the Act, 21 U.S.C. section 360bbb-3(b)(1), unless the authorization is terminated or revoked sooner. Performed at [facility], [address] [place], [place] [address]      Radiology Studies: No results found.  Scheduled Meds: . acidophilus  2 capsule Oral TID  . docusate sodium  100 mg Oral BID  . heparin  5,000 Units Subcutaneous Q8H  . linaclotide  145 mcg Oral QAC breakfast  . sodium chloride flush  3 mL Intravenous Q12H   Continuous Infusions: . ciprofloxacin 400 mg (12/29/19 0535)  . dextrose 5 % and 0.45 % NaCl with KCl 20 mEq/L 100 mL/hr at 12/29/19 1326  LOS: 3 days    Time spent: 30 minutes.    Vassie Loll, MD [facility]   To contact the attending provider between 7A-7P or the covering provider during after hours 7P-7A, please log into the web site [email or web address] and access using universal [place] password for that web site. If you do not have the password, please call the hospital operator.  12/29/2019, 2:53 PM

## 2019-12-29 NOTE — Care Management Important Message (Signed)
Important Message  Patient Details  Name: Sharon Jordan MRN: [id number] Date of Birth: 14-Jan-1946   Medicare Important Message Given:  Yes     Corey Harold 12/29/2019, 1:13 PM

## 2019-12-30 LAB — CBC
HCT: 33.3 % — ABNORMAL LOW (ref 36.0–46.0)
Hemoglobin: 9.9 g/dL — ABNORMAL LOW (ref 12.0–15.0)
MCH: 27.1 pg (ref 26.0–34.0)
MCHC: 29.7 g/dL — ABNORMAL LOW (ref 30.0–36.0)
MCV: 91.2 fL (ref 80.0–100.0)
Platelets: 221 10*3/uL (ref 150–400)
RBC: 3.65 MIL/uL — ABNORMAL LOW (ref 3.87–5.11)
RDW: 16.1 % — ABNORMAL HIGH (ref 11.5–15.5)
WBC: 5.4 10*3/uL (ref 4.0–10.5)
nRBC: 0 % (ref 0.0–0.2)

## 2019-12-30 LAB — BASIC METABOLIC PANEL
Anion gap: 6 (ref 5–15)
BUN: <5 mg/dL — ABNORMAL LOW (ref 8–23)
CO2: 32 mmol/L (ref 22–32)
Calcium: 8.8 mg/dL — ABNORMAL LOW (ref 8.9–10.3)
Chloride: 97 mmol/L — ABNORMAL LOW (ref 98–111)
Creatinine, Ser: 0.51 mg/dL (ref 0.44–1.00)
GFR calc Af Amer: >60 mL/min (ref >60)
GFR calc non Af Amer: >60 mL/min (ref >60)
Glucose, Bld: 111 mg/dL — ABNORMAL HIGH (ref 70–99)
Potassium: 4.8 mmol/L (ref 3.5–5.1)
Sodium: 135 mmol/L (ref 135–145)

## 2019-12-30 LAB — GLUCOSE, CAPILLARY: Glucose-Capillary: 93 mg/dL (ref 70–99)

## 2019-12-30 MED ORDER — METRONIDAZOLE 500 MG PO TABS
500.0000 mg | ORAL_TABLET | Freq: Three times a day (TID) | ORAL | Status: DC
  Administered 2019-12-30 – 2020-01-02 (×8): 500 mg via ORAL
  Filled 2019-12-30 (×8): qty 1

## 2019-12-30 MED ORDER — POLYVINYL ALCOHOL 1.4 % OP SOLN
1.0000 [drp] | OPHTHALMIC | Status: DC | PRN
  Administered 2019-12-30 (×2): 1 [drp] via OPHTHALMIC
  Filled 2019-12-30: qty 15

## 2019-12-30 MED ORDER — CIPROFLOXACIN HCL 250 MG PO TABS
500.0000 mg | ORAL_TABLET | Freq: Two times a day (BID) | ORAL | Status: DC
  Administered 2019-12-31 – 2020-01-02 (×5): 500 mg via ORAL
  Filled 2019-12-30 (×5): qty 2

## 2019-12-30 MED ORDER — CIPROFLOXACIN HCL 250 MG PO TABS: 500.0000 mg | ORAL_TABLET | Freq: Two times a day (BID) | ORAL | Status: DC

## 2019-12-30 NOTE — Progress Notes (Addendum)
Physical Therapy Treatment Patient Details Name: Sharon Jordan MRN: [id number] DOB: 01/05/1946 Today's Date: 12/30/2019    History of Present Illness Sharon Jordan is a 74 y.o. 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 Presented: With abdominal associate with nausea vomiting unable to tolerate solids or liquids.  Reporting diffuse abdominal pain worsening over the past 24 hours pain is most consistent in epigastric area and right area does not radiate to the back.     PT Comments    Pt supine at entrance and willing to participate with therapy today.  Pt declined transfer or gait training this session due to abdominal pain and fatigue due to rough night sleeping though was agreeable to complete exercises in bed.  Pt able to demonstrate appropriate mechanics with all exercises following explanation for purpose of exercise.  Noted pt wet in bed, NT called for cleansing and to check on purwick.  Pt demonstrated independent bed mobility with rolling as well as sliding towards HOB.  EOS pt reports pain reduced to 3-4/10, was 8/10 at beginning of sessoin.  Pt was pre-medicated prior therapy session.  Pt left in bed with call bell within reach and RN aware of status.   PT educated on benefits of getting out of bed for weakness and to reduce risk of pneumonia, etc.     Follow Up Recommendations  SNF     Equipment Recommendations  None recommended by PT    Recommendations for Other Services       Precautions / Restrictions Precautions Precautions: Fall Restrictions Weight Bearing Restrictions: No    Mobility  Bed Mobility Overal bed mobility: Independent Bed Mobility: Rolling Rolling: Independent         General bed mobility comments: Pt able to roll Rt and Lt as well as slide  to [facility] I, increased time with slow labored movements  Transfers Overall transfer level: (Pt declined due to abdominal pain and fatigue)                  Ambulation/Gait                 Stairs             Wheelchair Mobility    Modified Rankin (Stroke Patients Only)       Balance                                            Cognition Arousal/Alertness: Awake/alert Behavior During Therapy: WFL for tasks assessed/performed Overall Cognitive Status: Within Functional Limits for tasks assessed                                        Exercises General Exercises - Lower Extremity Ankle Circles/Pumps: AROM;20 reps;Supine Quad Sets: AROM;Both;10 reps;Supine Hip ABduction/ADduction: AROM;Both;Supine;10 reps Straight Leg Raises: AROM;Both;10 reps;Supine Other Exercises Other Exercises: LTR 3x 10" holds, isometric clam with therapist resistance for gluteal strengthening, bridges 10x 3" holds    General Comments        Pertinent Vitals/Pain Pain Assessment: No/denies pain Pain Score: 8  Pain Location: abdomen Pain Descriptors / Indicators: Constant;Grimacing Pain Intervention(s): Premedicated  before session;Repositioned;Monitored during session;Limited activity within patient's tolerance    Home Living                      Prior Function            PT Goals (current goals can now be found in the care plan section)      Frequency    Min 3X/week      PT Plan      Co-evaluation              AM-PAC PT "6 Clicks" Mobility   Outcome Measure  Help needed turning from your back to your side while in a flat bed without using bedrails?: None Help needed moving from lying on your back to sitting on the side of a flat bed without using bedrails?: A Little Help needed moving to and from a bed to a chair (including a wheelchair)?: A Lot Help needed standing up from a chair using your arms (e.g.,  wheelchair or bedside chair)?: A Lot Help needed to walk in hospital room?: A Lot Help needed climbing 3-5 steps with a railing? : Total 6 Click Score: 14    End of Session Equipment Utilized During Treatment: Oxygen Activity Tolerance: Patient limited by pain;Patient limited by fatigue Patient left: in bed;with call bell/phone within reach;with nursing/sitter in room Nurse Communication: Mobility status PT Visit Diagnosis: Unsteadiness on feet (R26.81);Other abnormalities of gait and mobility (R26.89);Muscle weakness (generalized) (M62.81)     Time: 1320-1410 PT Time Calculation (min) (ACUTE ONLY): 50 min  Charges:  $Therapeutic Activity: 38-52 mins                     Ihor Austin, LPTA/CLT; CBIS [phone]  Aldona Lento 12/30/2019, 2:34 PM

## 2019-12-30 NOTE — TOC Initial Note (Signed)
Transition of Care [facility]) - Initial/Assessment Note   Patient Details  Name: Sharon Jordan MRN: [id number] Date of Birth: [birth year]  Transition of Care [facility]) CM/SW Contact:    Ewing Schlein, LCSW Phone Number: 12/30/2019, 8:45 PM  Clinical Narrative: Patient is a 74 year old female who was admitted for enterocolitis. TOC received consult for SNF placement. FL2 completed. CSW made 2 attempts to reach the patient's daughter to get choices for SNF placement, but was unable to reach her. CSW left voicemail requesting call back to discuss SNF options for patient.  Expected Discharge Plan: Skilled Nursing Facility Barriers to Discharge: Continued Medical Work up  Patient Goals and CMS Choice Patient states their goals for this hospitalization and ongoing recovery are:: Discharge home  Expected Discharge Plan and Services Expected Discharge Plan: Skilled Nursing Facility In-house Referral: Clinical Social Work Post Acute Care Choice: Skilled Nursing Facility Living arrangements for the past 2 months: Single Family Home  Prior Living Arrangements/Services Living arrangements for the past 2 months: Single Family Home Lives with:: Self Patient language and need for interpreter reviewed:: Yes Do you feel safe going back to the place where you live?: Yes      Need for Family Participation in Patient Care: Yes (Comment)(Jill Elgie Congo (daughter) PH: [phone]) Care giver support system in place?: Yes (comment) Criminal Activity/Legal Involvement Pertinent to Current Situation/Hospitalization: No - Comment as needed  Activities of Daily Living Home Assistive Devices/Equipment: Bedside commode/3-in-1, Dentures (specify type), Eyeglasses, Shower chair with back, Walker (specify type) ADL Screening (condition at time of admission) Patient's cognitive ability adequate to safely complete daily activities?: Yes Is the patient deaf or have difficulty hearing?: No Does the patient have difficulty  seeing, even when wearing glasses/contacts?: No Does the patient have difficulty concentrating, remembering, or making decisions?: No Patient able to express need for assistance with ADLs?: Yes Does the patient have difficulty dressing or bathing?: No Independently performs ADLs?: Yes (appropriate for developmental age) Does the patient have difficulty walking or climbing stairs?: Yes Weakness of Legs: Both Weakness of Arms/Hands: None  Emotional Assessment Appearance:: Appears stated age Orientation: : Oriented to Self, Oriented to Place, Oriented to  Time, Oriented to Situation Alcohol / Substance Use: Not Applicable Psych Involvement: No (comment)  Admission diagnosis:  Enterocolitis [K52.9] Colitis [K52.9] Patient Active Problem List   Diagnosis Date Noted  . Enterocolitis 12/26/2019  . Colitis 12/26/2019  . Closed trochanteric fracture of right femur with routine healing 10/28/19 11/13/2019  . Chronic respiratory failure with hypoxia (HCC)   . Anxiety   . Hip fracture (HCC) 10/27/2019  . Acute exacerbation of chronic obstructive pulmonary disease (COPD) (HCC) 10/11/2019  . Intertrochanteric fracture of left femur (HCC) 09/05/2019  . Fracture of acetabulum, left, closed (HCC) 09/05/2019  . Hypertension 09/05/2019  . COPD (chronic obstructive pulmonary disease) (HCC) 09/05/2019  . Depression 09/05/2019  . GERD (gastroesophageal reflux disease) 09/05/2019  . RLS (restless legs syndrome) 09/05/2019  . Anemia 08/26/2019  . At risk for delirium 08/26/2019  . Hyponatremia 08/26/2019  . Closed fracture of pelvis (HCC) 08/23/2019   PCP:  [name], MD Pharmacy:   [facility] [place], [place] - [address] [place] [address] Phone: [phone] Fax: [phone]  Readmission Risk Interventions Readmission Risk Prevention Plan 10/28/2019  Transportation Screening Complete  Home Care Screening Complete  Medication Review (RN CM) Complete

## 2019-12-30 NOTE — Progress Notes (Signed)
PROGRESS NOTE    Sharon Jordan  TKW:[id number] DOB: January 13, 1946 DOA: 12/26/2019 PCP: Lucia Gaskins, MD    Chief Complaint  Patient presents with  . Abdominal Pain    Brief Narrative:  As per H&P written by Dr. Roger Shelter on 12/26/2019 74 y.o. 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  Presented: With abdominal associate with nausea vomiting unable to tolerate solids or liquids.  Reporting diffuse abdominal pain worsening over the past 24 hours pain is most consistent in epigastric area and right area does not radiate to the back.   Not associate with any fever chills more nausea some vomiting not associate with diarrhea or constipation.  Reporting a mild dysuria, with no discharge or foul odor.   Patient Denies having: Fever, Chills, Cough, SOB, Chest Pain,, headache, dizziness, lightheadedness, Joint pain, rash, open wounds  ED Course:   ED evaluation hemodynamically stable, mildly hypertensive, afebrile CBC CMP within normal limits exception of hemoglobin 10.4 hematocrit 35.3, sodium 133 potassium 3.4, BUN 12, creatinine 0.53, calcium 8.7, glucose 102, lactic acid 0.7, 0.5, UA consistent with nitrites, CT of abdomen: Reviewed acute on chronic changes were noted along with acute finding consistent with pancolonic enteric colitis IMPRESSION: 1. Several clustered loops of fluid-filled small bowel in the left lower quadrant as well as diffuse mild pancolonic edematous thickening with associated hazy mesenteric stranding, may represent a nonspecific enterocolitis. 2. Moderate fluid-filled hiatal hernia. Few fluid-filled airways in the right lung base could reflect aspiration. 3. Colonic diverticulosis without evidence of diverticulitis. 4. Lentiform 4.4 x 1.9 x 6 cm fluid  collection in the left hip likely related to prior left femur ORIF however sterility is not ascertained by imaging. 5. Age-indeterminate fracture of the right greater trochanter. Recommend correlation with point tenderness. 6. Stable appearance of an L1 compression deformity seen on comparison radiographs. 7. Prior cholecystectomy, hysterectomy, appendectomy. 8. Aortic Atherosclerosis (ICD10-I70.0).  Assessment & Plan: 1-enterocolitis/Klebsiella UTI -Microorganism resistant to ampicillin. -Continue antibiotics, but will transition to oral regimen. -Continue slowly advancing diet. -Continue as needed analgesics and antiemetics -Follow clinical response. -Continue IV fluids and supportive care.  2-Intertrochanteric fracture of left femur ([place])  -No signs of acute disalignment or fracture -Chronic changes associated with previous surgery. -Continue as needed analgesics -PT evaluation appreciated; recommendations given for skilled nursing facility discharge.  3-Hypertension -Overall stable and well-controlled -Continue to follow vital signs -Holding antihypertensive agents currently.  4-chronic respiratory failure due to COPD (chronic obstructive pulmonary disease) (HCC) -No wheezing or shortness of breath -Continue oxygen supplementation -Continue as needed bronchodilators.  5-history of depression/anxiety -Most recent ideation hallucination -Continue as needed anxiolytics.  6-GERD (gastroesophageal reflux disease) -Currently without any reflux symptoms -Holding PPIs for receiving antibiotics -will add famotidine.  7-RLS (restless legs syndrome) -Continue the use of Requip  8-history of osteoporosis -Continue Fosamax after discharge.  9-constipation -will continue linzess. -holding for diarrhea   10-physical deconditioning -Appreciate assessment and recommendations by PT -Patient in agreement to go to a skilled nursing facility for further care and  rehabilitation; first choice is [facility] in [place].   DVT prophylaxis: Heparin Code Status: Full code Family Communication: No family at bedside. Disposition:   Status is: Inpatient  Dispo: The patient is from: home              Anticipated d/c is to: home  Anticipated d/c date is: to be determined               Patient currently No medically ready for discharge; still having significant abdominal pain requiring IV analgesics and requiring IV antibiotics. Will continue antibiotics, but will transition to oral route and further advance her diet to soft.  Recommendations given for skilled nursing facility at discharge for further care and rehabilitation.     Consultants:   None   Procedures:   See below for x-ray reports   Antimicrobials:   Ciprofloxacin and Flagyl   Subjective: Liquid stool overnight.  No fever, no nausea, no vomiting.  Still with abdominal discomfort, but expressed that is better.  Willing to have diet advanced to soft.  Objective: Vitals:   12/30/19 0500 12/30/19 0523 12/30/19 1042 12/30/19 1316  BP:  (!) 146/69  (!) 120/52  Pulse:  79 87 79  Resp:  15  18  Temp:  (!) 97.5 F (36.4 C)  98.8 F (37.1 C)  TempSrc:    Oral  SpO2:  100% 100% 100%  Weight: 57.9 kg       Intake/Output Summary (Last 24 hours) at 12/30/2019 1737 Last data filed at 12/30/2019 1300 Gross per 24 hour  Intake 720 ml  Output 2000 ml  Net -1280 ml   Filed Weights   12/28/19 0500 12/29/19 0539 12/30/19 0500  Weight: 55 kg 56 kg 57.9 kg    Examination: General exam: Alert, awake, oriented x 3; patient reports having liquid stool overnight.  Improvement in her abdominal discomfort; even is still having significant pain requiring IV analgesics.  No nausea or vomiting and unwilling to have diet, but advanced.  Afebrile.  Generalized weakness, chronically ill in appearance and deconditioned.  Wearing 2 L nasal cannula supplementation. Respiratory system:  Clear to auscultation. Respiratory effort normal. Cardiovascular system:RRR. No murmurs, rubs, gallops. Gastrointestinal system: Abdomen is nondistended, soft and tender to palpation in lower quadrants bilaterally and suprapubic area.  No guarding.   Central nervous system: Alert and oriented. No focal neurological deficits. Extremities: No C/C/E, +pedal pulses Skin: No rashes, lesions or ulcers Psychiatry: Judgement and insight appear normal. Mood & affect appropriate.    Data Reviewed: I have personally reviewed following labs and imaging studies  CBC: Recent Labs  Lab 12/26/19 0110 12/27/19 0724 12/28/19 0727 12/29/19 0517 12/30/19 0431  WBC 5.2 5.7 6.9 5.6 5.4  NEUTROABS 4.0  --   --   --   --   HGB 10.4* 11.0* 10.9* 10.2* 9.9*  HCT 35.3* 35.9* 36.2 33.8* 33.3*  MCV 91.0 90.0 91.0 91.6 91.2  PLT 256 242 249 231 221    Basic Metabolic Panel: Recent Labs  Lab 12/26/19 0110 12/26/19 0110 12/26/19 0918 12/27/19 0724 12/28/19 0727 12/29/19 0517 12/30/19 0431  NA 133*  --   --  132* 135 137 135  K 3.4*  --   --  3.5 3.8 4.3 4.8  CL 90*  --   --  90* 94* 97* 97*  CO2 33*  --   --  32 33* 33* 32  GLUCOSE 102*  --   --  105* 105* 83 111*  BUN 12  --   --  8 5* <5* <5*  CREATININE 0.53  --   --  0.52 0.52 0.54 0.51  CALCIUM 8.7*   < > 8.5* 9.0 9.0 8.9 8.8*  MG  --   --  1.6*  --   --   --   --  PHOS  --   --  3.3  --   --   --   --    < > = values in this interval not displayed.    GFR: Estimated Creatinine Clearance: 48.4 mL/min (by C-G formula based on SCr of 0.51 mg/dL).  Liver Function Tests: Recent Labs  Lab 12/26/19 0110  AST 13*  ALT 11  ALKPHOS 74  BILITOT 0.7  PROT 6.0*  ALBUMIN 3.4*    CBG: Recent Labs  Lab 12/27/19 0723 12/27/19 1120 12/27/19 1637 12/28/19 0746 12/30/19 0744  GLUCAP 119* 116* 117* 114* 93     Recent Results (from the past 240 hour(s))  Urine Culture     Status: Abnormal   Collection Time: 12/26/19  1:11 AM    Specimen: Urine, Random  Result Value Ref Range Status   Specimen Description   Final    URINE, RANDOM Performed at [facility], [address], [place], [place] [address]    Special Requests   Final    NONE Performed at [facility], [address], [place], [place] [address]    Culture (A)  Final    >=100,000 COLONIES/mL KLEBSIELLA PNEUMONIAE Two isolates with different morphologies were identified as the same organism.The most resistant organism was reported. Performed at [facility], [address], [place], [place] [address]    Report Status 12/28/2019 FINAL  Final   Organism ID, Bacteria KLEBSIELLA PNEUMONIAE (A)  Final      Susceptibility   Klebsiella pneumoniae - MIC*    AMPICILLIN >=32 RESISTANT Resistant     CEFAZOLIN <=4 SENSITIVE Sensitive     CEFTRIAXONE <=1 SENSITIVE Sensitive     CIPROFLOXACIN <=0.25 SENSITIVE Sensitive     GENTAMICIN <=1 SENSITIVE Sensitive     IMIPENEM 1 SENSITIVE Sensitive     NITROFURANTOIN 64 INTERMEDIATE Intermediate     TRIMETH/SULFA <=20 SENSITIVE Sensitive     AMPICILLIN/SULBACTAM 4 SENSITIVE Sensitive     PIP/TAZO <=4 SENSITIVE Sensitive     * >=100,000 COLONIES/mL KLEBSIELLA PNEUMONIAE  SARS Coronavirus 2 by RT PCR (hospital order, performed in [facility] hospital lab) Nasopharyngeal Nasopharyngeal Swab     Status: None   Collection Time: 12/26/19  5:32 AM   Specimen: Nasopharyngeal Swab  Result Value Ref Range Status   SARS Coronavirus 2 NEGATIVE NEGATIVE Final    Comment: (NOTE) SARS-CoV-2 target nucleic acids are NOT DETECTED. The SARS-CoV-2 RNA is generally detectable in upper and lower respiratory specimens during the acute phase of infection. The lowest concentration of SARS-CoV-2 viral copies this assay can detect is 250 copies / mL. A negative result does not preclude SARS-CoV-2 infection and should not be used as the sole basis for treatment or other patient management decisions.  A negative result may occur  with improper specimen collection / handling, submission of specimen other than nasopharyngeal swab, presence of viral mutation(s) within the areas targeted by this assay, and inadequate number of viral copies (<250 copies / mL). A negative result must be combined with clinical observations, patient history, and epidemiological information. Fact Sheet for Patients:   BoilerBrush.com.cy Fact Sheet for Healthcare Providers: https://pope.com/ This test is not yet approved or cleared  by the [place] FDA and has been authorized for detection and/or diagnosis of SARS-CoV-2 by FDA under an Emergency Use Authorization (EUA).  This EUA will remain in effect (meaning this test can be used) for the duration of the COVID-19 declaration under Section 564(b)(1) of the Act, 21 U.S.C. section  360bbb-3(b)(1), unless the authorization is terminated or revoked sooner. Performed at [facility], [address], [place], [place] [address]      Radiology Studies: No results found.  Scheduled Meds: . acidophilus  2 capsule Oral TID  . ciprofloxacin  500 mg Oral BID  . docusate sodium  100 mg Oral BID  . heparin  5,000 Units Subcutaneous Q8H  . linaclotide  145 mcg Oral QAC breakfast  . metroNIDAZOLE  500 mg Oral Q8H  . sodium chloride flush  3 mL Intravenous Q12H   Continuous Infusions: . dextrose 5 % and 0.45 % NaCl with KCl 20 mEq/L 75 mL/hr at 12/30/19 1227     LOS: 4 days    Time spent: 30 minutes.    Vassie Loll, MD [facility]   To contact the attending provider between 7A-7P or the covering provider during after hours 7P-7A, please log into the web site [email or web address] and access using universal [place] password for that web site. If you do not have the password, please call the hospital operator.  12/30/2019, 5:37 PM

## 2019-12-30 NOTE — NC FL2 (Signed)
[place] MEDICAID FL2 LEVEL OF CARE SCREENING TOOL     IDENTIFICATION  Patient Name: Sharon Jordan Birthdate: 1945/10/12 Sex: female Admission Date (Current Location): 12/26/2019  [facility] and [place] Number:  [employer] and Address:  [facility],  [address] [place] [address]      Provider Number: [id number]  Attending Physician Name and Address:  Vassie Loll, MD  Relative Name and Phone Number:  [name] (daughter) [employer] [facility]: [phone]    Current Level of Care: Hospital Recommended Level of Care: Skilled Nursing Facility Prior Approval Number:    Date Approved/Denied:   PASRR Number: [id number]  Discharge Plan: SNF    Current Diagnoses: Patient Active Problem List   Diagnosis Date Noted  . Enterocolitis 12/26/2019  . Colitis 12/26/2019  . Closed trochanteric fracture of right femur with routine healing 10/28/19 11/13/2019  . Chronic respiratory failure with hypoxia (HCC)   . Anxiety   . Hip fracture (HCC) 10/27/2019  . Acute exacerbation of chronic obstructive pulmonary disease (COPD) (HCC) 10/11/2019  . Intertrochanteric fracture of left femur (HCC) 09/05/2019  . Fracture of acetabulum, left, closed (HCC) 09/05/2019  . Hypertension 09/05/2019  . COPD (chronic obstructive pulmonary disease) (HCC) 09/05/2019  . Depression 09/05/2019  . GERD (gastroesophageal reflux disease) 09/05/2019  . RLS (restless legs syndrome) 09/05/2019  . Anemia 08/26/2019  . At risk for delirium 08/26/2019  . Hyponatremia 08/26/2019  . Closed fracture of pelvis (HCC) 08/23/2019    Orientation RESPIRATION BLADDER Height & Weight     Self, Time, Situation, Place  O2(4L nasal cannula) Incontinent, External catheter Weight: 127 lb 10.3 oz (57.9 kg) Height:     BEHAVIORAL SYMPTOMS/MOOD NEUROLOGICAL BOWEL NUTRITION STATUS      Continent Diet(Full liquid)  AMBULATORY STATUS COMMUNICATION OF NEEDS Skin   Extensive Assist Verbally Normal                       Personal Care Assistance Level of Assistance  Bathing, Feeding, Dressing Bathing Assistance: Limited assistance Feeding assistance: Independent Dressing Assistance: Limited assistance     Functional Limitations Info  Sight, Hearing, Speech Sight Info: Adequate Hearing Info: Adequate Speech Info: Adequate    SPECIAL CARE FACTORS FREQUENCY  PT (By licensed PT), OT (By licensed OT)     PT Frequency: Min 3x's/week OT Frequency: Min 3x's/week            Contractures Contractures Info: Not present    Additional Factors Info  Allergies, Code Status, Psychotropic Code Status Info: Full Allergies Info: Amoxicillin; Cephalosporins; Clindamycin/lincomycin; Doxycycline; Penicillins Psychotropic Info: Desyrel (trazodone); Clonazepam (Klonopin)         Current Medications (12/30/2019):  This is the current hospital active medication list Current Facility-Administered Medications  Medication Dose Route Frequency Provider Last Rate Last Admin  . acetaminophen (TYLENOL) tablet 650 mg  650 mg Oral Q6H PRN Nevin Bloodgood A, MD   650 mg at 12/28/19 0608   Or  . acetaminophen (TYLENOL) suppository 650 mg  650 mg Rectal Q6H PRN Shahmehdi, Seyed A, MD      . acidophilus (RISAQUAD) capsule 2 capsule  2 capsule Oral TID Nevin Bloodgood A, MD   2 capsule at 12/30/19 1524  . alum & mag hydroxide-simeth (MAALOX/MYLANTA) 200-200-20 MG/5ML suspension 30 mL  30 mL Oral Q4H PRN Vassie Loll, MD      . ciprofloxacin (CIPRO) IVPB 400 mg  400 mg Intravenous Q12H Shahmehdi, Seyed A, MD 200 mL/hr at 12/30/19 1528 400  mg at 12/30/19 1528  . clonazePAM (KLONOPIN) disintegrating tablet 0.25 mg  0.25 mg Oral BID PRN Oswald Hillock, MD   0.25 mg at 12/29/19 1558  . dextrose 5 % and 0.45 % NaCl with KCl 20 mEq/L infusion   Intravenous Continuous Barton Dubois, MD 75 mL/hr at 12/30/19 1227 New Bag at 12/30/19 1227  . docusate sodium (COLACE) capsule 100 mg  100 mg Oral BID Shahmehdi, Seyed A, MD    100 mg at 12/29/19 0831  . guaiFENesin-dextromethorphan (ROBITUSSIN DM) 100-10 MG/5ML syrup 5 mL  5 mL Oral Q4H PRN Barton Dubois, MD   5 mL at 12/27/19 2003  . heparin injection 5,000 Units  5,000 Units Subcutaneous Q8H Shahmehdi, Seyed A, MD   5,000 Units at 12/30/19 1418  . HYDROmorphone (DILAUDID) injection 0.5 mg  0.5 mg Intravenous Q6H PRN Barton Dubois, MD   0.5 mg at 12/30/19 1156  . hyoscyamine (LEVSIN SL) SL tablet 0.125 mg  0.125 mg Oral Q8H PRN Barton Dubois, MD      . ketorolac (TORADOL) 15 MG/ML injection 15 mg  15 mg Intravenous Q6H PRN Skipper Cliche A, MD   15 mg at 12/30/19 0820  . levalbuterol (XOPENEX) nebulizer solution 0.63 mg  0.63 mg Nebulization Q6H PRN Shahmehdi, Seyed A, MD      . linaclotide (LINZESS) capsule 145 mcg  145 mcg Oral QAC breakfast Barton Dubois, MD   145 mcg at 12/29/19 0831  . ondansetron (ZOFRAN) tablet 4 mg  4 mg Oral Q6H PRN Shahmehdi, Seyed A, MD       Or  . ondansetron (ZOFRAN) injection 4 mg  4 mg Intravenous Q6H PRN Shahmehdi, Seyed A, MD   4 mg at 12/26/19 1024  . phenol (CHLORASEPTIC) mouth spray 1 spray  1 spray Mouth/Throat PRN Barton Dubois, MD      . polyvinyl alcohol (LIQUIFILM TEARS) 1.4 % ophthalmic solution 1 drop  1 drop Both Eyes PRN Barton Dubois, MD   1 drop at 12/30/19 1531  . senna-docusate (Senokot-S) tablet 1 tablet  1 tablet Oral QHS PRN Shahmehdi, Seyed A, MD      . sodium chloride (OCEAN) 0.65 % nasal spray 1 spray  1 spray Each Nare PRN Barton Dubois, MD      . sodium chloride flush (NS) 0.9 % injection 3 mL  3 mL Intravenous Q12H Shahmehdi, Seyed A, MD   3 mL at 12/30/19 0932  . sorbitol 70 % solution 30 mL  30 mL Oral Daily PRN Shahmehdi, Seyed A, MD      . traZODone (DESYREL) tablet 50 mg  50 mg Oral QHS PRN Deatra James, MD         Discharge Medications: Please see discharge summary for a list of discharge medications.  Relevant Imaging Results:  Relevant Lab Results:   Additional Information SSN:  [id number]  Sherie Don, LCSW

## 2019-12-31 ENCOUNTER — Inpatient Hospital Stay (HOSPITAL_COMMUNITY): Payer: Medicare Other

## 2019-12-31 LAB — CBC
HCT: 31.6 % — ABNORMAL LOW (ref 36.0–46.0)
Hemoglobin: 9.5 g/dL — ABNORMAL LOW (ref 12.0–15.0)
MCH: 27.3 pg (ref 26.0–34.0)
MCHC: 30.1 g/dL (ref 30.0–36.0)
MCV: 90.8 fL (ref 80.0–100.0)
Platelets: 204 10*3/uL (ref 150–400)
RBC: 3.48 MIL/uL — ABNORMAL LOW (ref 3.87–5.11)
RDW: 16.2 % — ABNORMAL HIGH (ref 11.5–15.5)
WBC: 6.3 10*3/uL (ref 4.0–10.5)
nRBC: 0 % (ref 0.0–0.2)

## 2019-12-31 LAB — BASIC METABOLIC PANEL
Anion gap: 6 (ref 5–15)
BUN: <5 mg/dL — ABNORMAL LOW (ref 8–23)
CO2: 31 mmol/L (ref 22–32)
Calcium: 8.6 mg/dL — ABNORMAL LOW (ref 8.9–10.3)
Chloride: 98 mmol/L (ref 98–111)
Creatinine, Ser: 0.52 mg/dL (ref 0.44–1.00)
GFR calc Af Amer: >60 mL/min (ref >60)
GFR calc non Af Amer: >60 mL/min (ref >60)
Glucose, Bld: 99 mg/dL (ref 70–99)
Potassium: 3.9 mmol/L (ref 3.5–5.1)
Sodium: 135 mmol/L (ref 135–145)

## 2019-12-31 MED ORDER — IOHEXOL 300 MG/ML  SOLN
100.0000 mL | Freq: Once | INTRAMUSCULAR | Status: AC | PRN
  Administered 2019-12-31: 100 mL via INTRAVENOUS

## 2019-12-31 NOTE — TOC Progression Note (Addendum)
Transition of Care [facility]) - Progression Note    Patient Details  Name: Sharon Jordan MRN: [id number] Date of Birth: 03/21/1946  [facility] CM/SW Contact  Annice Needy, LCSW Phone Number: 12/31/2019, 4:02 PM  Clinical Narrative:    Provided current bed offers to daughter, Ms. Elgie Congo. Requested that a referral be made to [name]. Referral Made. Patient has previously been in rehab in [place] on 06/12/2019-06/27/2019; 1/29/221-10/22/19 at [facility]; 11/01/19-11/29/19 at [facility]. Patient had PT/OT HH upon discharge from [facility]. Patient is likely in copay days.   Expected Discharge Plan: Skilled Nursing Facility Barriers to Discharge: Continued Medical Work up  Expected Discharge Plan and Services Expected Discharge Plan: Skilled Nursing Facility In-house Referral: Clinical Social Work   Post Acute Care Choice: Skilled Nursing Facility Living arrangements for the past 2 months: Single Family Home                                       Social Determinants of Health (SDOH) Interventions    Readmission Risk Interventions Readmission Risk Prevention Plan 10/28/2019  Transportation Screening Complete  Home Care Screening Complete  Medication Review (RN CM) Complete

## 2019-12-31 NOTE — Care Management Important Message (Signed)
Important Message  Patient Details  Name: Sharon Jordan MRN: [id number] Date of Birth: 1945-11-28   Medicare Important Message Given:  Yes     Corey Harold 12/31/2019, 4:34 PM

## 2019-12-31 NOTE — Plan of Care (Signed)

## 2019-12-31 NOTE — Progress Notes (Signed)
PROGRESS NOTE    Linah Klapper  LXB:[id number] DOB: Nov 15, 1945 DOA: 12/26/2019 PCP: [name], MD   Brief Narrative:  As per H&P written by Dr. Flossie Dibble on 12/26/2019 74 y.o.femalewith medical history significant ofCOPD O2 dependent 2 L at home, HTN, recent history of closed fracture of anterior left acetabulum, pelvic fracture,,chronic anemia, episodic delirium,depression, chronic hyponatremia, lupus, RLS .Marland KitchenEtc.  Presented:With abdominal associate with nausea vomiting unable to tolerate solids or liquids. Reporting diffuse abdominal pain worsening over the past 24 hours pain is most consistent in epigastric area and right area does not radiate to the back.  Not associate with any fever chills more nausea some vomiting not associate with diarrhea or constipation. Reporting a mild dysuria,with no discharge or foul odor.  Patient Denies having: Fever, Chills, Cough, SOB, Chest Pain,, headache, dizziness, lightheadedness, Joint pain, rash, open wounds  -Patient was admitted with enterocolitis along with Klebsiella UTI.  She has been transitioned to oral ciprofloxacin and Flagyl at this time, but continues to have ongoing pain and difficulty with appetite as well as nausea.  Assessment & Plan:   Principal Problem:   Enterocolitis Active Problems:   Intertrochanteric fracture of left femur (HCC)   Fracture of acetabulum, left, closed (HCC)   Hypertension   COPD (chronic obstructive pulmonary disease) (HCC)   Depression   GERD (gastroesophageal reflux disease)   RLS (restless legs syndrome)   Anemia   Colitis   1-enterocolitis/Klebsiella UTI -Microorganism resistant to ampicillin. -Continue  oral antibiotics and consider IV if needed -Reassess CT abdomen and pelvis today to see if any change from 5/28 due to poor progress -Continue as needed analgesics and antiemetics -Follow clinical response. -Continue IV fluids and supportive  care.  2-Intertrochanteric fracture of left femur (HCC)  -No signs of acute disalignment or fracture -Chronic changes associated with previous surgery. -Continue as needed analgesics -PT evaluation appreciated; recommendations given for skilled nursing facility discharge.  3-Hypertension -Overall stable and well-controlled -Continue to follow vital signs -Holding antihypertensive agents currently.  4-chronic respiratory failure due to COPD (chronic obstructive pulmonary disease) (HCC) -No wheezing or shortness of breath -Continue oxygen supplementation at bedtime, usually requires 2.5 L nasal cannula supplementation. -Continue as needed bronchodilators.  5-history of depression/anxiety -Most recent ideation hallucination -Continue as needed anxiolytics.  6-GERD (gastroesophageal reflux disease) -Currently without any reflux symptoms -Holding PPIs for receiving antibiotics -will add famotidine.  7-RLS (restless legs syndrome) -Continue the use of Requip  8-history of osteoporosis -Continue Fosamax after discharge.  9-constipation -will continue linzess. -holding for diarrhea   10-physical deconditioning -Appreciate assessment and recommendations by PT -Patient in agreement to go to a skilled nursing facility for further care and rehabilitation; first choice is [name] in [place].   DVT prophylaxis: Heparin Code Status: Full code Family Communication: No family at bedside. Disposition:   Status is: Inpatient  Dispo: The patient is from: home  Anticipated d/c is to: home  Anticipated d/c date is: to be determined   Patient currently No medically ready for discharge; still having significant abdominal pain requiring IV analgesics and requiring IV antibiotics. Will continue antibiotics, but will transition to oral route and further advance her diet to soft.  Recommendations given for skilled nursing facility at  discharge for further care and rehabilitation.     Consultants:   None  Procedures:   See imaging studies below  Antimicrobials:  Anti-infectives (From admission, onward)   Start     Dose/Rate Route Frequency Ordered Stop   12/31/19 0800  ciprofloxacin (CIPRO) tablet 500 mg     500 mg Oral 2 times daily 12/30/19 1800     12/30/19 2000  ciprofloxacin (CIPRO) tablet 500 mg  Status:  Discontinued     500 mg Oral 2 times daily 12/30/19 1737 12/30/19 1800   12/30/19 1800  metroNIDAZOLE (FLAGYL) tablet 500 mg     500 mg Oral Every 8 hours 12/30/19 1737     12/26/19 1600  ciprofloxacin (CIPRO) IVPB 400 mg  Status:  Discontinued     400 mg 200 mL/hr over 60 Minutes Intravenous Every 12 hours 12/26/19 0721 12/30/19 1737   12/26/19 1200  metroNIDAZOLE (FLAGYL) IVPB 500 mg     500 mg 100 mL/hr over 60 Minutes Intravenous Every 8 hours 12/26/19 0721 12/29/19 1428   12/26/19 0330  ciprofloxacin (CIPRO) IVPB 400 mg     400 mg 200 mL/hr over 60 Minutes Intravenous  Once 12/26/19 0325 12/26/19 0532   12/26/19 0330  metroNIDAZOLE (FLAGYL) IVPB 500 mg     500 mg 100 mL/hr over 60 Minutes Intravenous  Once 12/26/19 0325 12/26/19 0532       Subjective: Patient seen and evaluated today with noted ongoing abdominal pain.  She is still having trouble with her diet and states that she has minimal appetite as well as some nausea.  No vomiting or bowel movements noted.  Objective: Vitals:   12/30/19 2204 12/31/19 0447 12/31/19 0749 12/31/19 0900  BP: (!) 149/64 (!) 142/66    Pulse: 89 82    Resp: 18 15    Temp: 98.1 F (36.7 C) 98.6 F (37 C)    TempSrc: Oral Oral    SpO2: 100% 100% 98%   Weight:  55.9 kg    Height:    5' (1.524 m)    Intake/Output Summary (Last 24 hours) at 12/31/2019 1523 Last data filed at 12/31/2019 1300 Gross per 24 hour  Intake 1820 ml  Output 1500 ml  Net 320 ml   Filed Weights   12/29/19 0539 12/30/19 0500 12/31/19 0447  Weight: 56 kg 57.9 kg 55.9 kg     Examination:  General exam: Appears calm and comfortable  Respiratory system: Clear to auscultation. Respiratory effort normal.  Currently on 4 L nasal cannula oxygen Cardiovascular system: S1 & S2 heard, RRR. No JVD, murmurs, rubs, gallops or clicks. No pedal edema. Gastrointestinal system: Abdomen is nondistended, soft and tender to palpation over right lower quadrant. No organomegaly or masses felt. Normal bowel sounds heard. Central nervous system: Alert and oriented. No focal neurological deficits. Extremities: Symmetric 5 x 5 power. Skin: No rashes, lesions or ulcers Psychiatry: Judgement and insight appear normal. Mood & affect appropriate.     Data Reviewed: I have personally reviewed following labs and imaging studies  CBC: Recent Labs  Lab 12/26/19 0110 12/26/19 0110 12/27/19 0724 12/28/19 0727 12/29/19 0517 12/30/19 0431 12/31/19 0437  WBC 5.2   < > 5.7 6.9 5.6 5.4 6.3  NEUTROABS 4.0  --   --   --   --   --   --   HGB 10.4*   < > 11.0* 10.9* 10.2* 9.9* 9.5*  HCT 35.3*   < > 35.9* 36.2 33.8* 33.3* 31.6*  MCV 91.0   < > 90.0 91.0 91.6 91.2 90.8  PLT 256   < > 242 249 231 221 204   < > = values in this interval not displayed.   Basic Metabolic Panel: Recent Labs  Lab 12/26/19 0110 12/26/19 1224  12/27/19 0724 12/28/19 0727 12/29/19 0517 12/30/19 0431 12/31/19 0437  NA   < >  --  132* 135 137 135 135  K   < >  --  3.5 3.8 4.3 4.8 3.9  CL   < >  --  90* 94* 97* 97* 98  CO2   < >  --  32 33* 33* 32 31  GLUCOSE   < >  --  105* 105* 83 111* 99  BUN   < >  --  8 5* <5* <5* <5*  CREATININE   < >  --  0.52 0.52 0.54 0.51 0.52  CALCIUM   < > 8.5* 9.0 9.0 8.9 8.8* 8.6*  MG  --  1.6*  --   --   --   --   --   PHOS  --  3.3  --   --   --   --   --    < > = values in this interval not displayed.   GFR: Estimated Creatinine Clearance: 47.7 mL/min (by C-G formula based on SCr of 0.52 mg/dL). Liver Function Tests: Recent Labs  Lab 12/26/19 0110  AST 13*  ALT  11  ALKPHOS 74  BILITOT 0.7  PROT 6.0*  ALBUMIN 3.4*   Recent Labs  Lab 12/26/19 0110  LIPASE 22   No results for input(s): AMMONIA in the last 168 hours. Coagulation Profile: Recent Labs  Lab 12/27/19 0724  INR 1.0   Cardiac Enzymes: No results for input(s): CKTOTAL, CKMB, CKMBINDEX, TROPONINI in the last 168 hours. BNP (last 3 results) No results for input(s): PROBNP in the last 8760 hours. HbA1C: No results for input(s): HGBA1C in the last 72 hours. CBG: Recent Labs  Lab 12/27/19 0723 12/27/19 1120 12/27/19 1637 12/28/19 0746 12/30/19 0744  GLUCAP 119* 116* 117* 114* 93   Lipid Profile: No results for input(s): CHOL, HDL, LDLCALC, TRIG, CHOLHDL, LDLDIRECT in the last 72 hours. Thyroid Function Tests: No results for input(s): TSH, T4TOTAL, FREET4, T3FREE, THYROIDAB in the last 72 hours. Anemia Panel: No results for input(s): VITAMINB12, FOLATE, FERRITIN, TIBC, IRON, RETICCTPCT in the last 72 hours. Sepsis Labs: Recent Labs  Lab 12/26/19 0110 12/26/19 0327  LATICACIDVEN 0.7 0.5    Recent Results (from the past 240 hour(s))  Urine Culture     Status: Abnormal   Collection Time: 12/26/19  1:11 AM   Specimen: Urine, Random  Result Value Ref Range Status   Specimen Description   Final    URINE, RANDOM Performed at [facility], [address] [place], [place] [address]    Special Requests   Final    NONE Performed at [facility], [address], [place], [place] [address]    Culture (A)  Final    >=100,000 COLONIES/mL KLEBSIELLA PNEUMONIAE Two isolates with different morphologies were identified as the same organism.The most resistant organism was reported. Performed at [facility], [address], [place], [place] [address]    Report Status 12/28/2019 FINAL  Final   Organism ID, Bacteria KLEBSIELLA PNEUMONIAE (A)  Final      Susceptibility   Klebsiella pneumoniae - MIC*    AMPICILLIN >=32 RESISTANT Resistant     CEFAZOLIN <=4 SENSITIVE  Sensitive     CEFTRIAXONE <=1 SENSITIVE Sensitive     CIPROFLOXACIN <=0.25 SENSITIVE Sensitive     GENTAMICIN <=1 SENSITIVE Sensitive     IMIPENEM 1 SENSITIVE Sensitive     NITROFURANTOIN 64 INTERMEDIATE Intermediate  TRIMETH/SULFA <=20 SENSITIVE Sensitive     AMPICILLIN/SULBACTAM 4 SENSITIVE Sensitive     PIP/TAZO <=4 SENSITIVE Sensitive     * >=100,000 COLONIES/mL KLEBSIELLA PNEUMONIAE  SARS Coronavirus 2 by RT PCR (hospital order, performed in [facility] hospital lab) Nasopharyngeal Nasopharyngeal Swab     Status: None   Collection Time: 12/26/19  5:32 AM   Specimen: Nasopharyngeal Swab  Result Value Ref Range Status   SARS Coronavirus 2 NEGATIVE NEGATIVE Final    Comment: (NOTE) SARS-CoV-2 target nucleic acids are NOT DETECTED. The SARS-CoV-2 RNA is generally detectable in upper and lower respiratory specimens during the acute phase of infection. The lowest concentration of SARS-CoV-2 viral copies this assay can detect is 250 copies / mL. A negative result does not preclude SARS-CoV-2 infection and should not be used as the sole basis for treatment or other patient management decisions.  A negative result may occur with improper specimen collection / handling, submission of specimen other than nasopharyngeal swab, presence of viral mutation(s) within the areas targeted by this assay, and inadequate number of viral copies (<250 copies / mL). A negative result must be combined with clinical observations, patient history, and epidemiological information. Fact Sheet for Patients:   StrictlyIdeas.no Fact Sheet for Healthcare Providers: BankingDealers.co.za This test is not yet approved or cleared  by the [place] FDA and has been authorized for detection and/or diagnosis of SARS-CoV-2 by FDA under an Emergency Use Authorization (EUA).  This EUA will remain in effect (meaning this test can be used) for the duration of  the COVID-19 declaration under Section 564(b)(1) of the Act, 21 U.S.C. section 360bbb-3(b)(1), unless the authorization is terminated or revoked sooner. Performed at [facility], [address], [place], [place] [address]          Radiology Studies: No results found.      Scheduled Meds: . acidophilus  2 capsule Oral TID  . ciprofloxacin  500 mg Oral BID  . docusate sodium  100 mg Oral BID  . heparin  5,000 Units Subcutaneous Q8H  . linaclotide  145 mcg Oral QAC breakfast  . metroNIDAZOLE  500 mg Oral Q8H  . sodium chloride flush  3 mL Intravenous Q12H   Continuous Infusions: . dextrose 5 % and 0.45 % NaCl with KCl 20 mEq/L 75 mL/hr at 12/31/19 1517     LOS: 5 days    Time spent: 30 minutes    Mollyann Halbert Darleen Crocker, DO [facility]  If 7PM-7AM, please contact night-coverage [email or web address] 12/31/2019, 3:23 PM

## 2020-01-01 LAB — COMPREHENSIVE METABOLIC PANEL
ALT: 10 U/L (ref 0–44)
AST: 14 U/L — ABNORMAL LOW (ref 15–41)
Albumin: 2.7 g/dL — ABNORMAL LOW (ref 3.5–5.0)
Alkaline Phosphatase: 60 U/L (ref 38–126)
Anion gap: 6 (ref 5–15)
BUN: <5 mg/dL — ABNORMAL LOW (ref 8–23)
CO2: 34 mmol/L — ABNORMAL HIGH (ref 22–32)
Calcium: 8.8 mg/dL — ABNORMAL LOW (ref 8.9–10.3)
Chloride: 98 mmol/L (ref 98–111)
Creatinine, Ser: 0.55 mg/dL (ref 0.44–1.00)
GFR calc Af Amer: >60 mL/min (ref >60)
GFR calc non Af Amer: >60 mL/min (ref >60)
Glucose, Bld: 112 mg/dL — ABNORMAL HIGH (ref 70–99)
Potassium: 4.6 mmol/L (ref 3.5–5.1)
Sodium: 138 mmol/L (ref 135–145)
Total Bilirubin: 0.5 mg/dL (ref 0.3–1.2)
Total Protein: 4.6 g/dL — ABNORMAL LOW (ref 6.5–8.1)

## 2020-01-01 LAB — CBC
HCT: 31.5 % — ABNORMAL LOW (ref 36.0–46.0)
Hemoglobin: 9.7 g/dL — ABNORMAL LOW (ref 12.0–15.0)
MCH: 28.1 pg (ref 26.0–34.0)
MCHC: 30.8 g/dL (ref 30.0–36.0)
MCV: 91.3 fL (ref 80.0–100.0)
Platelets: 202 10*3/uL (ref 150–400)
RBC: 3.45 MIL/uL — ABNORMAL LOW (ref 3.87–5.11)
RDW: 16.9 % — ABNORMAL HIGH (ref 11.5–15.5)
WBC: 4.4 10*3/uL (ref 4.0–10.5)
nRBC: 0 % (ref 0.0–0.2)

## 2020-01-01 LAB — MAGNESIUM: Magnesium: 1.5 mg/dL — ABNORMAL LOW (ref 1.7–2.4)

## 2020-01-01 LAB — LACTIC ACID, PLASMA: Lactic Acid, Venous: 0.8 mmol/L (ref 0.5–1.9)

## 2020-01-01 LAB — GLUCOSE, CAPILLARY: Glucose-Capillary: 104 mg/dL — ABNORMAL HIGH (ref 70–99)

## 2020-01-01 MED ORDER — DOCUSATE SODIUM 100 MG PO CAPS
100.0000 mg | ORAL_CAPSULE | Freq: Every day | ORAL | Status: DC
  Administered 2020-01-03: 100 mg via ORAL
  Filled 2020-01-01 (×3): qty 1

## 2020-01-01 MED ORDER — MAGNESIUM SULFATE 2 GM/50ML IV SOLN
2.0000 g | Freq: Once | INTRAVENOUS | Status: AC
  Administered 2020-01-01: 2 g via INTRAVENOUS
  Filled 2020-01-01: qty 50

## 2020-01-01 MED ORDER — PANTOPRAZOLE SODIUM 40 MG PO TBEC
40.0000 mg | DELAYED_RELEASE_TABLET | Freq: Two times a day (BID) | ORAL | Status: DC
  Administered 2020-01-01 – 2020-01-03 (×6): 40 mg via ORAL
  Filled 2020-01-01 (×6): qty 1

## 2020-01-01 MED ORDER — ENSURE ENLIVE PO LIQD
237.0000 mL | Freq: Two times a day (BID) | ORAL | Status: DC
  Administered 2020-01-01 – 2020-01-03 (×3): 237 mL via ORAL

## 2020-01-01 MED ORDER — METHOCARBAMOL 500 MG PO TABS
500.0000 mg | ORAL_TABLET | Freq: Two times a day (BID) | ORAL | Status: DC | PRN
  Administered 2020-01-01: 500 mg via ORAL
  Filled 2020-01-01: qty 1

## 2020-01-01 MED ORDER — DULOXETINE HCL 30 MG PO CPEP
30.0000 mg | ORAL_CAPSULE | Freq: Every day | ORAL | Status: DC
  Administered 2020-01-01 – 2020-01-03 (×3): 30 mg via ORAL
  Filled 2020-01-01 (×3): qty 1

## 2020-01-01 MED ORDER — ROPINIROLE HCL 1 MG PO TABS
4.0000 mg | ORAL_TABLET | Freq: Every day | ORAL | Status: DC
  Administered 2020-01-01: 4 mg via ORAL
  Filled 2020-01-01: qty 4

## 2020-01-01 MED ORDER — BUPROPION HCL ER (XL) 300 MG PO TB24
300.0000 mg | ORAL_TABLET | Freq: Every day | ORAL | Status: DC
  Administered 2020-01-01 – 2020-01-03 (×3): 300 mg via ORAL
  Filled 2020-01-01 (×3): qty 1

## 2020-01-01 MED ORDER — GABAPENTIN 400 MG PO CAPS
400.0000 mg | ORAL_CAPSULE | Freq: Three times a day (TID) | ORAL | Status: DC
  Administered 2020-01-01 – 2020-01-02 (×4): 400 mg via ORAL
  Filled 2020-01-01 (×4): qty 1

## 2020-01-01 NOTE — Progress Notes (Signed)
PROGRESS NOTE    Genevra Orne  GUY:[id number] DOB: 06-Mar-1946 DOA: 12/26/2019 PCP: [name], MD   Brief Narrative:  As per H&P written by Dr. Flossie Dibble on 12/26/2019 74 y.o.femalewith medical history significant ofCOPD O2 dependent 2 L at home, HTN, recent history of closed fracture of anterior left acetabulum, pelvic fracture,,chronic anemia, episodic delirium,depression, chronic hyponatremia, lupus, RLS .Marland KitchenEtc.  Presented:With abdominal associate with nausea vomiting unable to tolerate solids or liquids. Reporting diffuse abdominal pain worsening over the past 24 hours pain is most consistent in epigastric area and right area does not radiate to the back.  Not associate with any fever chills more nausea some vomiting not associate with diarrhea or constipation. Reporting a mild dysuria,with no discharge or foul odor.  Patient Denies having: Fever, Chills, Cough, SOB, Chest Pain,, headache, dizziness, lightheadedness, Joint pain, rash, open wounds  -Patient was admitted with enterocolitis along with Klebsiella UTI.  She is tolerating oral ciprofloxacin and Flagyl well at this time and states that she is having improvement in her abdominal pain as well as her nausea.  Repeat CT scan with noted gastritis and no other acute findings noted.  Assessment & Plan:   Principal Problem:   Enterocolitis Active Problems:   Intertrochanteric fracture of left femur (HCC)   Fracture of acetabulum, left, closed (HCC)   Hypertension   COPD (chronic obstructive pulmonary disease) (HCC)   Depression   GERD (gastroesophageal reflux disease)   RLS (restless legs syndrome)   Anemia   Colitis   1-enterocolitis/Klebsiella UTI-improving -Microorganism resistant to ampicillin. -Continue oral antibiotics for now -Repeat CT abdomen on 6/2 with findings of gastritis and no other acute changes noted -Continue as needed analgesics and antiemetics -Follow clinical response with  improvement noted. -No further need for IV fluids  2-Intertrochanteric fracture of left femur (HCC) -No signs of acute disalignment or fracture -Chronic changes associated with previous surgery. -Continue as needed analgesics -PT evaluation appreciated; recommendations given for skilled nursing facility discharge.  3-Hypertension -Overall stable and well-controlled -Continue to follow vital signs -Holding antihypertensive agents currently.  4-chronic respiratory failure due to COPD (chronic obstructive pulmonary disease) (HCC) -No wheezing or shortness of breath -Continue oxygen supplementation at bedtime, usually requires 2.5 L nasal cannula supplementation. -Continue as needed bronchodilators.  5-history of depression/anxiety -Most recent ideation hallucination -Continue as needed anxiolytics.  6-GERD (gastroesophageal reflux disease) with gastritis on CT 6/2 -Currently without any reflux symptoms -PPI twice daily for now  7-RLS (restless legs syndrome) -Continue the use of Requip  8-history of osteoporosis -Continue Fosamax after discharge.  9-constipation -will continue linzess. -holding for diarrhea   10-physical deconditioning -Appreciate assessment and recommendations by PT -Patient in agreement to go to a skilled nursing facility for further care and rehabilitation; first choice is [facility] in [place].    DVT prophylaxis:Heparin Code Status:Full code Family Communication:Daughter at bedside 6/3 Disposition:  Status is: Inpatient  Dispo: The patient is from: home Anticipated d/c is to: SNF Anticipated d/c date is: 6/3 Patient currentlyhaving some improvement in her overall symptoms but not quite tolerating diet.  Maintain on PPI twice daily and oral antibiotics and anticipate discharge by a.m. if demonstrating continued signs of improvement.  Consultants:  None  Procedures:   See  imaging studies below  Antimicrobials:  Anti-infectives (From admission, onward)   Start     Dose/Rate Route Frequency Ordered Stop   12/31/19 0800  ciprofloxacin (CIPRO) tablet 500 mg     500 mg Oral 2 times daily 12/30/19 1800  12/30/19 2000  ciprofloxacin (CIPRO) tablet 500 mg  Status:  Discontinued     500 mg Oral 2 times daily 12/30/19 1737 12/30/19 1800   12/30/19 1800  metroNIDAZOLE (FLAGYL) tablet 500 mg     500 mg Oral Every 8 hours 12/30/19 1737     12/26/19 1600  ciprofloxacin (CIPRO) IVPB 400 mg  Status:  Discontinued     400 mg 200 mL/hr over 60 Minutes Intravenous Every 12 hours 12/26/19 0721 12/30/19 1737   12/26/19 1200  metroNIDAZOLE (FLAGYL) IVPB 500 mg     500 mg 100 mL/hr over 60 Minutes Intravenous Every 8 hours 12/26/19 0721 12/29/19 1428   12/26/19 0330  ciprofloxacin (CIPRO) IVPB 400 mg     400 mg 200 mL/hr over 60 Minutes Intravenous  Once 12/26/19 0325 12/26/19 0532   12/26/19 0330  metroNIDAZOLE (FLAGYL) IVPB 500 mg     500 mg 100 mL/hr over 60 Minutes Intravenous  Once 12/26/19 0325 12/26/19 0532       Subjective: Patient seen and evaluated today with improvement in abdominal pain as well as nausea this morning.  She denies any further diarrhea and has been having small portions of her meals.  She typically snacks and does not eat large quantities at each meal.  Objective: Vitals:   12/31/19 0900 12/31/19 1547 12/31/19 2233 01/01/20 0600  BP:  (!) 103/56 115/62 136/69  Pulse:  (!) 110 (!) 105 90  Resp:  19 19   Temp:  98.8 F (37.1 C) 98.7 F (37.1 C) 97.7 F (36.5 C)  TempSrc:  Oral Oral Oral  SpO2:  92% 99% 100%  Weight:    59 kg  Height: 5' (1.524 m)       Intake/Output Summary (Last 24 hours) at 01/01/2020 1122 Last data filed at 01/01/2020 0900 Gross per 24 hour  Intake 1531.18 ml  Output 600 ml  Net 931.18 ml   Filed Weights   12/30/19 0500 12/31/19 0447 01/01/20 0600  Weight: 57.9 kg 55.9 kg 59 kg    Examination:  General  exam: Appears calm and comfortable  Respiratory system: Clear to auscultation. Respiratory effort normal. Cardiovascular system: S1 & S2 heard, RRR. No JVD, murmurs, rubs, gallops or clicks. No pedal edema. Gastrointestinal system: Abdomen is nondistended, soft and nontender. No organomegaly or masses felt. Normal bowel sounds heard. Central nervous system: Alert and oriented. No focal neurological deficits. Extremities: Symmetric 5 x 5 power. Skin: No rashes, lesions or ulcers Psychiatry: Judgement and insight appear normal. Mood & affect appropriate.     Data Reviewed: I have personally reviewed following labs and imaging studies  CBC: Recent Labs  Lab 12/26/19 0110 12/27/19 0724 12/28/19 0727 12/29/19 0517 12/30/19 0431 12/31/19 0437 01/01/20 0657  WBC 5.2   < > 6.9 5.6 5.4 6.3 4.4  NEUTROABS 4.0  --   --   --   --   --   --   HGB 10.4*   < > 10.9* 10.2* 9.9* 9.5* 9.7*  HCT 35.3*   < > 36.2 33.8* 33.3* 31.6* 31.5*  MCV 91.0   < > 91.0 91.6 91.2 90.8 91.3  PLT 256   < > 249 231 221 204 202   < > = values in this interval not displayed.   Basic Metabolic Panel: Recent Labs  Lab 12/26/19 0918 12/27/19 0724 12/28/19 0727 12/29/19 0517 12/30/19 0431 12/31/19 0437 01/01/20 0657  NA  --    < > 135 137 135 135 138  K  --    < > 3.8 4.3 4.8 3.9 4.6  CL  --    < > 94* 97* 97* 98 98  CO2  --    < > 33* 33* 32 31 34*  GLUCOSE  --    < > 105* 83 111* 99 112*  BUN  --    < > 5* <5* <5* <5* <5*  CREATININE  --    < > 0.52 0.54 0.51 0.52 0.55  CALCIUM 8.5*   < > 9.0 8.9 8.8* 8.6* 8.8*  MG 1.6*  --   --   --   --   --  1.5*  PHOS 3.3  --   --   --   --   --   --    < > = values in this interval not displayed.   GFR: Estimated Creatinine Clearance: 48.8 mL/min (by C-G formula based on SCr of 0.55 mg/dL). Liver Function Tests: Recent Labs  Lab 12/26/19 0110 01/01/20 0657  AST 13* 14*  ALT 11 10  ALKPHOS 74 60  BILITOT 0.7 0.5  PROT 6.0* 4.6*  ALBUMIN 3.4* 2.7*    Recent Labs  Lab 12/26/19 0110  LIPASE 22   No results for input(s): AMMONIA in the last 168 hours. Coagulation Profile: Recent Labs  Lab 12/27/19 0724  INR 1.0   Cardiac Enzymes: No results for input(s): CKTOTAL, CKMB, CKMBINDEX, TROPONINI in the last 168 hours. BNP (last 3 results) No results for input(s): PROBNP in the last 8760 hours. HbA1C: No results for input(s): HGBA1C in the last 72 hours. CBG: Recent Labs  Lab 12/27/19 1120 12/27/19 1637 12/28/19 0746 12/30/19 0744 01/01/20 0740  GLUCAP 116* 117* 114* 93 104*   Lipid Profile: No results for input(s): CHOL, HDL, LDLCALC, TRIG, CHOLHDL, LDLDIRECT in the last 72 hours. Thyroid Function Tests: No results for input(s): TSH, T4TOTAL, FREET4, T3FREE, THYROIDAB in the last 72 hours. Anemia Panel: No results for input(s): VITAMINB12, FOLATE, FERRITIN, TIBC, IRON, RETICCTPCT in the last 72 hours. Sepsis Labs: Recent Labs  Lab 12/26/19 0110 12/26/19 0327 01/01/20 0657  LATICACIDVEN 0.7 0.5 0.8    Recent Results (from the past 240 hour(s))  Urine Culture     Status: Abnormal   Collection Time: 12/26/19  1:11 AM   Specimen: Urine, Random  Result Value Ref Range Status   Specimen Description   Final    URINE, RANDOM Performed at [facility], [address], [place], [place] [address]    Special Requests   Final    NONE Performed at [facility], [address], [place], [place] [address]    Culture (A)  Final    >=100,000 COLONIES/mL KLEBSIELLA PNEUMONIAE Two isolates with different morphologies were identified as the same organism.The most resistant organism was reported. Performed at [facility], [address] [place], [place] [address]    Report Status 12/28/2019 FINAL  Final   Organism ID, Bacteria KLEBSIELLA PNEUMONIAE (A)  Final      Susceptibility   Klebsiella pneumoniae - MIC*    AMPICILLIN >=32 RESISTANT Resistant     CEFAZOLIN <=4 SENSITIVE Sensitive     CEFTRIAXONE <=1 SENSITIVE  Sensitive     CIPROFLOXACIN <=0.25 SENSITIVE Sensitive     GENTAMICIN <=1 SENSITIVE Sensitive     IMIPENEM 1 SENSITIVE Sensitive     NITROFURANTOIN 64 INTERMEDIATE Intermediate     TRIMETH/SULFA <=20 SENSITIVE Sensitive     AMPICILLIN/SULBACTAM 4 SENSITIVE Sensitive  PIP/TAZO <=4 SENSITIVE Sensitive     * >=100,000 COLONIES/mL KLEBSIELLA PNEUMONIAE  SARS Coronavirus 2 by RT PCR (hospital order, performed in [facility] hospital lab) Nasopharyngeal Nasopharyngeal Swab     Status: None   Collection Time: 12/26/19  5:32 AM   Specimen: Nasopharyngeal Swab  Result Value Ref Range Status   SARS Coronavirus 2 NEGATIVE NEGATIVE Final    Comment: (NOTE) SARS-CoV-2 target nucleic acids are NOT DETECTED. The SARS-CoV-2 RNA is generally detectable in upper and lower respiratory specimens during the acute phase of infection. The lowest concentration of SARS-CoV-2 viral copies this assay can detect is 250 copies / mL. A negative result does not preclude SARS-CoV-2 infection and should not be used as the sole basis for treatment or other patient management decisions.  A negative result may occur with improper specimen collection / handling, submission of specimen other than nasopharyngeal swab, presence of viral mutation(s) within the areas targeted by this assay, and inadequate number of viral copies (<250 copies / mL). A negative result must be combined with clinical observations, patient history, and epidemiological information. Fact Sheet for Patients:   BoilerBrush.com.cy Fact Sheet for Healthcare Providers: https://pope.com/ This test is not yet approved or cleared  by the [place] FDA and has been authorized for detection and/or diagnosis of SARS-CoV-2 by FDA under an Emergency Use Authorization (EUA).  This EUA will remain in effect (meaning this test can be used) for the duration of the COVID-19 declaration under Section 564(b)(1) of  the Act, 21 U.S.C. section 360bbb-3(b)(1), unless the authorization is terminated or revoked sooner. Performed at [facility], [address], [place], [place] [address]          Radiology Studies: CT ABDOMEN PELVIS W CONTRAST  Result Date: 12/31/2019 CLINICAL DATA:  74 year old female with concern for abdominal infection. EXAM: CT ABDOMEN AND PELVIS WITH CONTRAST TECHNIQUE: Multidetector CT imaging of the abdomen and pelvis was performed using the standard protocol following bolus administration of intravenous contrast. CONTRAST:  OMNIPAQUE IOHEXOL 300 MG/ML  SOLN COMPARISON:  CT abdomen pelvis dated 12/26/2019. FINDINGS: Lower chest: Trace bilateral pleural effusions. There is calcification of the mitral annulus. No intra-abdominal free air. No free fluid. Hepatobiliary: The liver is unremarkable. There is mild intrahepatic biliary ductal dilatation. Cholecystectomy. No retained calcified stone noted in the central CBD. Pancreas: The pancreas is suboptimally visualized but appears unremarkable. Spleen: Normal in size without focal abnormality. Adrenals/Urinary Tract: The adrenal glands are unremarkable. Bilateral renal cortical irregularity. There is a 4 mm nonobstructing left renal interpolar stone. There is no hydronephrosis on either side. There is symmetric enhancement and excretion of contrast by both kidneys. The visualized ureters and urinary bladder appear unremarkable. Stomach/Bowel: There is a moderate size hiatal hernia. There is mild thickened appearance of the distal stomach with probable mild inflammation. Although this may be partly related to underdistention findings concerning for gastritis. Clinical correlation is recommended. A 2.5 cm duodenal diverticulum is noted. There is sigmoid diverticulosis without active inflammatory changes. There is no bowel obstruction. Vascular/Lymphatic: Moderate aortoiliac atherosclerotic disease. The IVC is unremarkable. No portal venous gas.  There is no adenopathy. Reproductive: Hysterectomy. Other: Mild diffuse subcutaneous edema. Musculoskeletal: Osteopenia with degenerative changes of the spine. Old left femoral neck fracture status post internal fixation. Old fracture of the greater trochanter of the right femur with incomplete union. Old left pubic bone fractures noted. No acute fracture. IMPRESSION: 1. Findings concerning for gastritis. Clinical correlation is recommended. 2. Moderate size hiatal hernia. 3.  Sigmoid diverticulosis. No bowel obstruction. 4. A 4 mm nonobstructing left renal interpolar stone. No hydronephrosis. 5. Aortic Atherosclerosis (ICD10-I70.0). Electronically Signed   By: Elgie CollardArash  Radparvar M.D.   On: 12/31/2019 17:38        Scheduled Meds: . acidophilus  2 capsule Oral TID  . buPROPion  300 mg Oral Daily  . ciprofloxacin  500 mg Oral BID  . docusate sodium  100 mg Oral Daily  . DULoxetine  30 mg Oral Daily  . feeding supplement (ENSURE ENLIVE)  237 mL Oral BID BM  . gabapentin  400 mg Oral TID  . heparin  5,000 Units Subcutaneous Q8H  . linaclotide  145 mcg Oral QAC breakfast  . metroNIDAZOLE  500 mg Oral Q8H  . pantoprazole  40 mg Oral BID AC  . rOPINIRole  4 mg Oral QHS  . sodium chloride flush  3 mL Intravenous Q12H   Continuous Infusions: . magnesium sulfate bolus IVPB       LOS: 6 days    Time spent: 30 minutes    Alexianna Nachreiner Hoover Brunette Lupe Handley, DO [facility]  If 7PM-7AM, please contact night-coverage [email or web address] 01/01/2020, 11:22 AM

## 2020-01-01 NOTE — TOC Progression Note (Signed)
Transition of Care [facility]) - Progression Note    Patient Details  Name: Sharon Jordan MRN: [id number] Date of Birth: 03-10-1946  Transition of Care [facility]) CM/SW Contact  Annice Needy, LCSW Phone Number: 01/01/2020, 5:14 PM  Clinical Narrative:    DIL, [name], provided bed offer from [facility] and [facility]. Will call back with choice.    Expected Discharge Plan: Skilled Nursing Facility Barriers to Discharge: Continued Medical Work up  Expected Discharge Plan and Services Expected Discharge Plan: Skilled Nursing Facility In-house Referral: Clinical Social Work   Post Acute Care Choice: Skilled Nursing Facility Living arrangements for the past 2 months: Single Family Home                                       Social Determinants of Health (SDOH) Interventions    Readmission Risk Interventions Readmission Risk Prevention Plan 10/28/2019  Transportation Screening Complete  Home Care Screening Complete  Medication Review (RN CM) Complete

## 2020-01-02 LAB — GLUCOSE, CAPILLARY
Glucose-Capillary: 116 mg/dL — ABNORMAL HIGH (ref 70–99)
Glucose-Capillary: 138 mg/dL — ABNORMAL HIGH (ref 70–99)

## 2020-01-02 LAB — COMPREHENSIVE METABOLIC PANEL WITH GFR
ALT: 12 U/L (ref 0–44)
AST: 17 U/L (ref 15–41)
Albumin: 3 g/dL — ABNORMAL LOW (ref 3.5–5.0)
Alkaline Phosphatase: 63 U/L (ref 38–126)
Anion gap: 7 (ref 5–15)
BUN: 9 mg/dL (ref 8–23)
CO2: 31 mmol/L (ref 22–32)
Calcium: 8.9 mg/dL (ref 8.9–10.3)
Chloride: 99 mmol/L (ref 98–111)
Creatinine, Ser: 0.78 mg/dL (ref 0.44–1.00)
GFR calc Af Amer: >60 mL/min
GFR calc non Af Amer: >60 mL/min
Glucose, Bld: 136 mg/dL — ABNORMAL HIGH (ref 70–99)
Potassium: 5.1 mmol/L (ref 3.5–5.1)
Sodium: 137 mmol/L (ref 135–145)
Total Bilirubin: 0.6 mg/dL (ref 0.3–1.2)
Total Protein: 5.3 g/dL — ABNORMAL LOW (ref 6.5–8.1)

## 2020-01-02 LAB — CBC
HCT: 35.8 % — ABNORMAL LOW (ref 36.0–46.0)
Hemoglobin: 10.7 g/dL — ABNORMAL LOW (ref 12.0–15.0)
MCH: 27.6 pg (ref 26.0–34.0)
MCHC: 29.9 g/dL — ABNORMAL LOW (ref 30.0–36.0)
MCV: 92.5 fL (ref 80.0–100.0)
Platelets: 195 10*3/uL (ref 150–400)
RBC: 3.87 MIL/uL (ref 3.87–5.11)
RDW: 17.5 % — ABNORMAL HIGH (ref 11.5–15.5)
WBC: 9.4 10*3/uL (ref 4.0–10.5)
nRBC: 0 % (ref 0.0–0.2)

## 2020-01-02 LAB — MAGNESIUM: Magnesium: 1.9 mg/dL (ref 1.7–2.4)

## 2020-01-02 MED ORDER — SUCRALFATE 1 GM/10ML PO SUSP
1.0000 g | Freq: Three times a day (TID) | ORAL | Status: DC
  Administered 2020-01-02 – 2020-01-03 (×5): 1 g via ORAL
  Filled 2020-01-02 (×5): qty 10

## 2020-01-02 MED ORDER — OXYCODONE HCL 5 MG PO TABS
5.0000 mg | ORAL_TABLET | ORAL | Status: DC | PRN
  Administered 2020-01-02 – 2020-01-03 (×6): 5 mg via ORAL
  Filled 2020-01-02 (×6): qty 1

## 2020-01-02 MED ORDER — SODIUM CHLORIDE 0.9 % IV SOLN: INTRAVENOUS | Status: AC

## 2020-01-02 NOTE — Progress Notes (Addendum)
PROGRESS NOTE    Niyonna Betsill  OJJ:[id number] DOB: 02-22-1946 DOA: 12/26/2019 PCP: [name], MD   Brief Narrative:  As per H&P written by Dr. Flossie Dibble on 12/26/2019 74 y.o.femalewith medical history significant ofCOPD O2 dependent 2 L at home, HTN, recent history of closed fracture of anterior left acetabulum, pelvic fracture,,chronic anemia, episodic delirium,depression, chronic hyponatremia, lupus, RLS .Marland KitchenEtc.  Presented:With abdominal associate with nausea vomiting unable to tolerate solids or liquids. Reporting diffuse abdominal pain worsening over the past 24 hours pain is most consistent in epigastric area and right area does not radiate to the back.  Not associate with any fever chills more nausea some vomiting not associate with diarrhea or constipation. Reporting a mild dysuria,with no discharge or foul odor.  Patient Denies having: Fever, Chills, Cough, SOB, Chest Pain,, headache, dizziness, lightheadedness, Joint pain, rash, open wounds  -Patient was admitted with enterocolitis along with Klebsiella UTI.  She is tolerating oral ciprofloxacin and Flagyl well at this time and states that she is having improvement in her abdominal pain as well as her nausea.  Repeat CT scan with noted gastritis and no other acute findings noted.  She is noted to have some hypotension and depression today.  She still is not eating very well.   Assessment & Plan:   Principal Problem:   Enterocolitis Active Problems:   Intertrochanteric fracture of left femur (HCC)   Fracture of acetabulum, left, closed (HCC)   Hypertension   COPD (chronic obstructive pulmonary disease) (HCC)   Depression   GERD (gastroesophageal reflux disease)   RLS (restless legs syndrome)   Anemia   Colitis   1-enterocolitis/Klebsiella UTI-improving -Microorganism resistant to ampicillin. -Discontinue Flagyl and ciprofloxacin today as it has been 7 days -Repeat CT abdomen on 6/2 with  findings of gastritis and no other acute changes noted -Continue as needed analgesics and antiemetics -Follow clinical response with improvement noted.  2-Intertrochanteric fracture of left femur (HCC) -No signs of acute disalignment or fracture -Chronic changes associated with previous surgery. -Continue as needed analgesics -PT evaluation appreciated; recommendations given for skilled nursing facility discharge.  3-Hypertension -Currently with soft blood pressure readings and some orthostasis noted -Plan for IV fluid today -Continue to follow vital signs -Holding antihypertensive agents currently.  4-chronic respiratory failure due to COPD (chronic obstructive pulmonary disease) (HCC) -No wheezing or shortness of breath -Continue oxygen supplementationat bedtime, usually requires 2.5 L nasal cannula supplementation. -Continue as needed bronchodilators.  5-history of depression/anxiety -Most recent ideation hallucination -Continue as needed anxiolytics -Home Cymbalta and Wellbutrin resumed on 6/3, continues to have significant symptoms  6-GERD (gastroesophageal reflux disease) with gastritis on CT 6/2 -Currently without any reflux symptoms -PPI twice daily for now  7-RLS (restless legs syndrome) -Continue the use of Requip  8-history of osteoporosis -Continue Fosamax after discharge.  9-constipation -will continue linzess. -holding for diarrhea   10-physical deconditioning -Appreciate assessment and recommendations by PT -Patient in agreement to go to a skilled nursing facility for further care and rehabilitation; first choice is [facility] in [place]; plan for potential discharge by this weekend if possible.  11-gastritis -Trial of PPI as well as Carafate  DVT prophylaxis:Heparin Code Status:Full code Family Communication:Daughter at bedside 6/3 Disposition:  Status is: Inpatient  Dispo: The patient is from: home Anticipated  d/c is to: SNF Anticipated d/c date is: 6/4 Patient currentlycontinues to have some ongoing symptoms with noted depression and weakness as well today.  Blood pressures are soft.  Plan adjust medications and continue to follow.  Once further improvement noted in vital signs as well as weakness/appetite.  We will plan to discharge to SNF.  Consultants:  None  Procedures:  See imaging studies below  Antimicrobials:  Anti-infectives (From admission, onward)   Start     Dose/Rate Route Frequency Ordered Stop   12/31/19 0800  ciprofloxacin (CIPRO) tablet 500 mg  Status:  Discontinued     500 mg Oral 2 times daily 12/30/19 1800 01/02/20 1128   12/30/19 2000  ciprofloxacin (CIPRO) tablet 500 mg  Status:  Discontinued     500 mg Oral 2 times daily 12/30/19 1737 12/30/19 1800   12/30/19 1800  metroNIDAZOLE (FLAGYL) tablet 500 mg     500 mg Oral Every 8 hours 12/30/19 1737     12/26/19 1600  ciprofloxacin (CIPRO) IVPB 400 mg  Status:  Discontinued     400 mg 200 mL/hr over 60 Minutes Intravenous Every 12 hours 12/26/19 0721 12/30/19 1737   12/26/19 1200  metroNIDAZOLE (FLAGYL) IVPB 500 mg     500 mg 100 mL/hr over 60 Minutes Intravenous Every 8 hours 12/26/19 0721 12/29/19 1428   12/26/19 0330  ciprofloxacin (CIPRO) IVPB 400 mg     400 mg 200 mL/hr over 60 Minutes Intravenous  Once 12/26/19 0325 12/26/19 0532   12/26/19 0330  metroNIDAZOLE (FLAGYL) IVPB 500 mg     500 mg 100 mL/hr over 60 Minutes Intravenous  Once 12/26/19 0325 12/26/19 0532          Subjective: Patient seen and evaluated today with some ongoing depression noted.  She has felt badly ever since having left her home state of [place] where she is to visit her friends regularly.  She continues to have some ongoing abdominal pain as well as weakness and poor appetite.  Objective: Vitals:   01/02/20 0554 01/02/20 1100 01/02/20 1206 01/02/20 1300  BP: (!) 88/54 (!) 104/54  105/63  Pulse:  (!) 110 (!) 116 (!) 118 95  Resp: 16 20  18   Temp: 97.9 F (36.6 C) 98.5 F (36.9 C)  98.5 F (36.9 C)  TempSrc: Oral Oral  Oral  SpO2: 95% 95% 97% 100%  Weight:      Height:        Intake/Output Summary (Last 24 hours) at 01/02/2020 1333 Last data filed at 01/02/2020 0900 Gross per 24 hour  Intake 524.29 ml  Output 150 ml  Net 374.29 ml   Filed Weights   12/31/19 0447 01/01/20 0600 01/02/20 0500  Weight: 55.9 kg 59 kg 59 kg    Examination:  General exam: Appears calm and comfortable  Respiratory system: Clear to auscultation. Respiratory effort normal.  Currently on nasal cannula oxygen. Cardiovascular system: S1 & S2 heard, RRR. No JVD, murmurs, rubs, gallops or clicks. No pedal edema. Gastrointestinal system: Abdomen is nondistended, soft and nontender. No organomegaly or masses felt. Normal bowel sounds heard. Central nervous system: Alert and oriented. No focal neurological deficits. Extremities: Symmetric 5 x 5 power. Skin: No rashes, lesions or ulcers Psychiatry: Flat affect and appears depressed    Data Reviewed: I have personally reviewed following labs and imaging studies  CBC: Recent Labs  Lab 12/29/19 0517 12/30/19 0431 12/31/19 0437 01/01/20 0657 01/02/20 0500  WBC 5.6 5.4 6.3 4.4 9.4  HGB 10.2* 9.9* 9.5* 9.7* 10.7*  HCT 33.8* 33.3* 31.6* 31.5* 35.8*  MCV 91.6 91.2 90.8 91.3 92.5  PLT 231 221 204 202 629   Basic Metabolic Panel: Recent Labs  Lab 12/29/19 0517 12/30/19 0431  12/31/19 0437 01/01/20 0657 01/02/20 0500  NA 137 135 135 138 137  K 4.3 4.8 3.9 4.6 5.1  CL 97* 97* 98 98 99  CO2 33* 32 31 34* 31  GLUCOSE 83 111* 99 112* 136*  BUN <5* <5* <5* <5* 9  CREATININE 0.54 0.51 0.52 0.55 0.78  CALCIUM 8.9 8.8* 8.6* 8.8* 8.9  MG  --   --   --  1.5* 1.9   GFR: Estimated Creatinine Clearance: 48.8 mL/min (by C-G formula based on SCr of 0.78 mg/dL). Liver Function Tests: Recent Labs  Lab 01/01/20 0657 01/02/20 0500  AST 14* 17  ALT 10  12  ALKPHOS 60 63  BILITOT 0.5 0.6  PROT 4.6* 5.3*  ALBUMIN 2.7* 3.0*   No results for input(s): LIPASE, AMYLASE in the last 168 hours. No results for input(s): AMMONIA in the last 168 hours. Coagulation Profile: Recent Labs  Lab 12/27/19 0724  INR 1.0   Cardiac Enzymes: No results for input(s): CKTOTAL, CKMB, CKMBINDEX, TROPONINI in the last 168 hours. BNP (last 3 results) No results for input(s): PROBNP in the last 8760 hours. HbA1C: No results for input(s): HGBA1C in the last 72 hours. CBG: Recent Labs  Lab 12/27/19 1637 12/28/19 0746 12/30/19 0744 01/01/20 0740 01/02/20 0831  GLUCAP 117* 114* 93 104* 116*   Lipid Profile: No results for input(s): CHOL, HDL, LDLCALC, TRIG, CHOLHDL, LDLDIRECT in the last 72 hours. Thyroid Function Tests: No results for input(s): TSH, T4TOTAL, FREET4, T3FREE, THYROIDAB in the last 72 hours. Anemia Panel: No results for input(s): VITAMINB12, FOLATE, FERRITIN, TIBC, IRON, RETICCTPCT in the last 72 hours. Sepsis Labs: Recent Labs  Lab 01/01/20 0657  LATICACIDVEN 0.8    Recent Results (from the past 240 hour(s))  Urine Culture     Status: Abnormal   Collection Time: 12/26/19  1:11 AM   Specimen: Urine, Random  Result Value Ref Range Status   Specimen Description   Final    URINE, RANDOM Performed at [facility], [address] [place], [place] [address]    Special Requests   Final    NONE Performed at [facility], [address] [place], [place] [address]    Culture (A)  Final    >=100,000 COLONIES/mL KLEBSIELLA PNEUMONIAE Two isolates with different morphologies were identified as the same organism.The most resistant organism was reported. Performed at [facility], [address] [place], [place] [address]    Report Status 12/28/2019 FINAL  Final   Organism ID, Bacteria KLEBSIELLA PNEUMONIAE (A)  Final      Susceptibility   Klebsiella pneumoniae - MIC*    AMPICILLIN >=32 RESISTANT Resistant     CEFAZOLIN <=4  SENSITIVE Sensitive     CEFTRIAXONE <=1 SENSITIVE Sensitive     CIPROFLOXACIN <=0.25 SENSITIVE Sensitive     GENTAMICIN <=1 SENSITIVE Sensitive     IMIPENEM 1 SENSITIVE Sensitive     NITROFURANTOIN 64 INTERMEDIATE Intermediate     TRIMETH/SULFA <=20 SENSITIVE Sensitive     AMPICILLIN/SULBACTAM 4 SENSITIVE Sensitive     PIP/TAZO <=4 SENSITIVE Sensitive     * >=100,000 COLONIES/mL KLEBSIELLA PNEUMONIAE  SARS Coronavirus 2 by RT PCR (hospital order, performed in [facility] hospital lab) Nasopharyngeal Nasopharyngeal Swab     Status: None   Collection Time: 12/26/19  5:32 AM   Specimen: Nasopharyngeal Swab  Result Value Ref Range Status   SARS Coronavirus 2 NEGATIVE NEGATIVE Final    Comment: (NOTE) SARS-CoV-2 target nucleic acids are NOT DETECTED.  The SARS-CoV-2 RNA is generally detectable in upper and lower respiratory specimens during the acute phase of infection. The lowest concentration of SARS-CoV-2 viral copies this assay can detect is 250 copies / mL. A negative result does not preclude SARS-CoV-2 infection and should not be used as the sole basis for treatment or other patient management decisions.  A negative result may occur with improper specimen collection / handling, submission of specimen other than nasopharyngeal swab, presence of viral mutation(s) within the areas targeted by this assay, and inadequate number of viral copies (<250 copies / mL). A negative result must be combined with clinical observations, patient history, and epidemiological information. Fact Sheet for Patients:   BoilerBrush.com.cy Fact Sheet for Healthcare Providers: https://pope.com/ This test is not yet approved or cleared  by the [place] FDA and has been authorized for detection and/or diagnosis of SARS-CoV-2 by FDA under an Emergency Use Authorization (EUA).  This EUA will remain in effect (meaning this test can be used) for the duration of  the COVID-19 declaration under Section 564(b)(1) of the Act, 21 U.S.C. section 360bbb-3(b)(1), unless the authorization is terminated or revoked sooner. Performed at [facility], [address], [place], [place] [address]          Radiology Studies: CT ABDOMEN PELVIS W CONTRAST  Result Date: 12/31/2019 CLINICAL DATA:  74 year old female with concern for abdominal infection. EXAM: CT ABDOMEN AND PELVIS WITH CONTRAST TECHNIQUE: Multidetector CT imaging of the abdomen and pelvis was performed using the standard protocol following bolus administration of intravenous contrast. CONTRAST:  OMNIPAQUE IOHEXOL 300 MG/ML  SOLN COMPARISON:  CT abdomen pelvis dated 12/26/2019. FINDINGS: Lower chest: Trace bilateral pleural effusions. There is calcification of the mitral annulus. No intra-abdominal free air. No free fluid. Hepatobiliary: The liver is unremarkable. There is mild intrahepatic biliary ductal dilatation. Cholecystectomy. No retained calcified stone noted in the central CBD. Pancreas: The pancreas is suboptimally visualized but appears unremarkable. Spleen: Normal in size without focal abnormality. Adrenals/Urinary Tract: The adrenal glands are unremarkable. Bilateral renal cortical irregularity. There is a 4 mm nonobstructing left renal interpolar stone. There is no hydronephrosis on either side. There is symmetric enhancement and excretion of contrast by both kidneys. The visualized ureters and urinary bladder appear unremarkable. Stomach/Bowel: There is a moderate size hiatal hernia. There is mild thickened appearance of the distal stomach with probable mild inflammation. Although this may be partly related to underdistention findings concerning for gastritis. Clinical correlation is recommended. A 2.5 cm duodenal diverticulum is noted. There is sigmoid diverticulosis without active inflammatory changes. There is no bowel obstruction. Vascular/Lymphatic: Moderate aortoiliac atherosclerotic  disease. The IVC is unremarkable. No portal venous gas. There is no adenopathy. Reproductive: Hysterectomy. Other: Mild diffuse subcutaneous edema. Musculoskeletal: Osteopenia with degenerative changes of the spine. Old left femoral neck fracture status post internal fixation. Old fracture of the greater trochanter of the right femur with incomplete union. Old left pubic bone fractures noted. No acute fracture. IMPRESSION: 1. Findings concerning for gastritis. Clinical correlation is recommended. 2. Moderate size hiatal hernia. 3. Sigmoid diverticulosis. No bowel obstruction. 4. A 4 mm nonobstructing left renal interpolar stone. No hydronephrosis. 5. Aortic Atherosclerosis (ICD10-I70.0). Electronically Signed   By: Elgie Collard M.D.   On: 12/31/2019 17:38        Scheduled Meds: . acidophilus  2 capsule Oral TID  . buPROPion  300 mg Oral Daily  . docusate sodium  100 mg Oral Daily  . DULoxetine  30 mg Oral Daily  .  feeding supplement (ENSURE ENLIVE)  237 mL Oral BID BM  . heparin  5,000 Units Subcutaneous Q8H  . linaclotide  145 mcg Oral QAC breakfast  . metroNIDAZOLE  500 mg Oral Q8H  . pantoprazole  40 mg Oral BID AC  . sodium chloride flush  3 mL Intravenous Q12H  . sucralfate  1 g Oral TID WC & HS   Continuous Infusions: . sodium chloride       LOS: 7 days    Time spent: 35 minutes    Cornelio Parkerson Hoover Brunette, DO [facility]  If 7PM-7AM, please contact night-coverage [email or web address] 01/02/2020, 1:33 PM

## 2020-01-02 NOTE — Progress Notes (Signed)
Physical Therapy Treatment Patient Details Name: Sharon Jordan MRN: [id number] DOB: 1945-08-06 Today's Date: 01/02/2020    History of Present Illness Sharon Jordan is a 73 y.o. 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 Presented: With abdominal associate with nausea vomiting unable to tolerate solids or liquids.  Reporting diffuse abdominal pain worsening over the past 24 hours pain is most consistent in epigastric area and right area does not radiate to the back.     PT Comments    Pt supine and willing to participate with therapy today.  Pt wet bed, NT called for cleansing.  Pt independent bed mobility and min guard with transfers, just cueing for hand placement to assist with standing.  Increased distance with gait training today though was limited by fatigue.  Gait complete on room air with O2 saturation range from 87-96%.  Pt instructed diaphragmatic breathing to assist with SOB.  2 sets of gait training complete this session with seated rest break to fatigue recovery.  EOS pt left in chair with call bell within reach and RN aware of status.   Follow Up Recommendations  SNF     Equipment Recommendations  None recommended by PT    Recommendations for Other Services       Precautions / Restrictions Precautions Precautions: Fall Restrictions Weight Bearing Restrictions: No    Mobility  Bed Mobility Overal bed mobility: Independent                Transfers Overall transfer level: Modified independent Equipment used: Rolling walker (2 wheeled)     Stand pivot transfers: Min guard       General transfer comment: Cueing for hand placement prior STS; slow labored movements  Ambulation/Gait Ambulation/Gait assistance: Min guard;Min assist Gait Distance  (Feet): 20 Feet(2 sets x 20 feet wiht seated rest break between. Room air O2 sat range from 87-96%) Assistive device: Rolling walker (2 wheeled) Gait Pattern/deviations: Step-through pattern;Trunk flexed;Decreased dorsiflexion - right;Decreased dorsiflexion - left Gait velocity: decreased   General Gait Details: limited by fatigue required seated rest break, slow labored cadence wiht RW.  No LOB episodes   Stairs             Wheelchair Mobility    Modified Rankin (Stroke Patients Only)       Balance                                            Cognition Arousal/Alertness: Awake/alert Behavior During Therapy: WFL for tasks assessed/performed Overall Cognitive Status: Within Functional Limits for tasks assessed                                        Exercises General Exercises - Lower Extremity Ankle Circles/Pumps: AROM;20 reps;Supine Long Arc Quad: AROM;Both;10 reps Heel Slides: AROM;Both;10 reps    General Comments        Pertinent Vitals/Pain Pain Assessment: 0-10 Pain Score: 5  Pain Location: abdomen Pain Descriptors / Indicators: Constant;Grimacing Pain Intervention(s): Premedicated before session;Monitored during session;Limited activity within patient's tolerance;Repositioned    Home Living  Prior Function            PT Goals (current goals can now be found in the care plan section)      Frequency    Min 3X/week      PT Plan Current plan remains appropriate    Co-evaluation              AM-PAC PT "6 Clicks" Mobility   Outcome Measure  Help needed turning from your back to your side while in a flat bed without using bedrails?: None Help needed moving from lying on your back to sitting on the side of a flat bed without using bedrails?: A Little Help needed moving to and from a bed to a chair (including a wheelchair)?: A Little Help needed standing up from a chair using  your arms (e.g., wheelchair or bedside chair)?: A Little Help needed to walk in hospital room?: A Little Help needed climbing 3-5 steps with a railing? : Total 6 Click Score: 17    End of Session Equipment Utilized During Treatment: Oxygen(Room air during gait) Activity Tolerance: Patient tolerated treatment well;No increased pain;Patient limited by fatigue Patient left: in chair;with call bell/phone within reach Nurse Communication: Mobility status PT Visit Diagnosis: Unsteadiness on feet (R26.81);Other abnormalities of gait and mobility (R26.89);Muscle weakness (generalized) (M62.81)     Time: 8563-1497 PT Time Calculation (min) (ACUTE ONLY): 40 min  Charges:  $Therapeutic Activity: 38-52 mins                     Becky Sax, LPTA/CLT; CBIS [phone]   Juel Burrow 01/02/2020, 12:12 PM

## 2020-01-02 NOTE — TOC Progression Note (Signed)
Transition of Care [facility]) - Progression Note   Patient Details  Name: Sharon Jordan MRN: [id number] Date of Birth: October 30, 1945  [facility]) CM/SW Contact  Ewing Schlein, LCSW Phone Number: 01/02/2020, 3:31 PM  Clinical Narrative: CSW spoke with [name] at [facility]. Patient has been offered a bed, but [name] prefers not to do weekend admissions if possible. Per hospitalist, patient's vitals need to be monitored at least another 1-2 days. CSW followed up with [name]. [name] requested that weekend staff call her [phone]) to see if patient can be admitted over the weekend. Patient will need to be in isolation for 14 days (no family visits) as she is not fully vaccinated for COVID. CSW called daughter, [name], to update her. Daughter reported the patient wants to go to [name] and the patient is okay will no family visits for 14 days.  Expected Discharge Plan: Skilled Nursing Facility Barriers to Discharge: Continued Medical Work up  Expected Discharge Plan and Services Expected Discharge Plan: Skilled Nursing Facility In-house Referral: Clinical Social Work Post Acute Care Choice: Skilled Nursing Facility Living arrangements for the past 2 months: Single Family Home  Readmission Risk Interventions Readmission Risk Prevention Plan 10/28/2019  Transportation Screening Complete  Home Care Screening Complete  Medication Review (RN CM) Complete

## 2020-01-02 NOTE — Progress Notes (Signed)
MEWS Guidelines - (patients age 76 and over) Pt's MEWS score is yellow because of SBP of 88 and HR 110. When RN went in to administer scheduled medications per [facility] pt was resting with her oxygen off, Sats= 78% on room air, came up to the 90s on 2LNC, but HR remained 110+. BP was taken on both arms and is soft. Pt alert, oriented, c/o pain, treated with hot paks and meds per MAR. MD notified per protocol, pt currently stable, continuing to monitor per protocol.

## 2020-01-02 NOTE — Progress Notes (Signed)
   01/02/20 1100  Assess: MEWS Score  Temp 98.5 F (36.9 C)  BP (!) 104/54  Pulse Rate (!) 116  Resp 20  Level of Consciousness Alert  SpO2 95 %  O2 Device Nasal Cannula  O2 Flow Rate (L/min) 2.5 L/min  Assess: MEWS Score  MEWS Temp 0  MEWS Systolic 0  MEWS Pulse 2  MEWS RR 0  MEWS LOC 0  MEWS Score 2  MEWS Score Color Yellow  Assess: if the MEWS score is Yellow or Red  Were vital signs taken at a resting state? Yes  Focused Assessment Documented focused assessment  Early Detection of Sepsis Score *See Row Information* Low  MEWS guidelines implemented *See Row Information* No, previously yellow, continue vital signs every 4 hours  Treat  MEWS Interventions Other (Comment) (MD made aware; pt working with PT at time)  Escalate  MEWS: Escalate Yellow: discuss with charge nurse/RN and consider discussing with provider and RRT  Notify: Charge Nurse/RN  Name of Charge Nurse/RN Notified [place], RN  Date Charge Nurse/RN Notified 01/02/20  Time Charge Nurse/RN Notified 1126  Notify: Provider  Provider Name/Title P. Sherryll Burger, DO  Date Provider Notified 01/02/20  Time Provider Notified 1124  Notification Type Page  Notification Reason Change in status (MEWS green to yellow)  Response No new orders

## 2020-01-02 NOTE — Care Management Important Message (Signed)
Important Message  Patient Details  Name: Sharon Jordan MRN: [id number] Date of Birth: 1946/03/30   Medicare Important Message Given:  Yes     Corey Harold 01/02/2020, 1:13 PM

## 2020-01-03 LAB — BASIC METABOLIC PANEL
Anion gap: 6 (ref 5–15)
BUN: 15 mg/dL (ref 8–23)
CO2: 32 mmol/L (ref 22–32)
Calcium: 8.5 mg/dL — ABNORMAL LOW (ref 8.9–10.3)
Chloride: 98 mmol/L (ref 98–111)
Creatinine, Ser: 0.63 mg/dL (ref 0.44–1.00)
GFR calc Af Amer: >60 mL/min (ref >60)
GFR calc non Af Amer: >60 mL/min (ref >60)
Glucose, Bld: 106 mg/dL — ABNORMAL HIGH (ref 70–99)
Potassium: 4.2 mmol/L (ref 3.5–5.1)
Sodium: 136 mmol/L (ref 135–145)

## 2020-01-03 LAB — CBC
HCT: 29 % — ABNORMAL LOW (ref 36.0–46.0)
Hemoglobin: 8.6 g/dL — ABNORMAL LOW (ref 12.0–15.0)
MCH: 27.4 pg (ref 26.0–34.0)
MCHC: 29.7 g/dL — ABNORMAL LOW (ref 30.0–36.0)
MCV: 92.4 fL (ref 80.0–100.0)
Platelets: 180 10*3/uL (ref 150–400)
RBC: 3.14 MIL/uL — ABNORMAL LOW (ref 3.87–5.11)
RDW: 17.5 % — ABNORMAL HIGH (ref 11.5–15.5)
WBC: 4.5 10*3/uL (ref 4.0–10.5)
nRBC: 0 % (ref 0.0–0.2)

## 2020-01-03 MED ORDER — SUCRALFATE 1 GM/10ML PO SUSP: 1.0000 g | Freq: Three times a day (TID) | ORAL | 0 refills | Status: DC

## 2020-01-03 MED ORDER — ONDANSETRON HCL 4 MG PO TABS: 4.0000 mg | ORAL_TABLET | Freq: Four times a day (QID) | ORAL | 0 refills | Status: DC | PRN

## 2020-01-03 MED ORDER — HYOSCYAMINE SULFATE 0.125 MG SL SUBL: 0.1250 mg | SUBLINGUAL_TABLET | Freq: Three times a day (TID) | SUBLINGUAL | 0 refills | Status: DC | PRN

## 2020-01-03 MED ORDER — ENSURE ENLIVE PO LIQD: 237.0000 mL | Freq: Two times a day (BID) | ORAL | 12 refills | Status: AC

## 2020-01-03 MED ORDER — ALUM & MAG HYDROXIDE-SIMETH 200-200-20 MG/5ML PO SUSP: 30.0000 mL | ORAL | 0 refills | Status: DC | PRN

## 2020-01-03 MED ORDER — CLONAZEPAM 0.5 MG PO TABS: 0.2500 mg | ORAL_TABLET | Freq: Two times a day (BID) | ORAL | 0 refills | Status: DC | PRN

## 2020-01-03 MED ORDER — LINACLOTIDE 145 MCG PO CAPS: 145.0000 ug | ORAL_CAPSULE | Freq: Every day | ORAL | 0 refills | Status: DC

## 2020-01-03 MED ORDER — OXYCODONE HCL 5 MG PO TABS: 5.0000 mg | ORAL_TABLET | ORAL | 0 refills | Status: DC | PRN

## 2020-01-03 NOTE — Progress Notes (Addendum)
CSW spoke with Lowella Bandy at [name] regarding this patient's admission to the facility - Lowella Bandy states the patient can come to the facility today. Nikki to contact patient's daughter to inform her of discharge and coordinate time for paperwork to be completed.  Patient will go to room 422 at [facility]. The number to call for report is [phone]. RN was notified to arrange for transportation to facility.  Edwin Dada, MSW, LCSW-A Transitions of Care  Clinical Social Worker  [facility] Emergency Departments  Medical ICU [phone]

## 2020-01-03 NOTE — Progress Notes (Signed)
Report called to [name] to house supervisor [place].  EMS called and on the way for transport.

## 2020-01-03 NOTE — Discharge Summary (Signed)
Physician Discharge Summary  Sharon Jordan DOB: 07/31/1946 DOA: 12/26/2019  PCP: [name], MD  Admit date: 12/26/2019  Discharge date: 01/03/2020  Admitted From:Home  Disposition:  SNF  Recommendations for Outpatient Follow-up:  1. Follow up with PCP in 1-2 weeks 2. Hold amlodipine given softer blood pressure readings and may resume once blood pressure readings begin to increase 3. Hold gabapentin, methocarbamol, and Requip as these medications appear to be contributing more to fatigue as well as lightheadedness and dizziness.  May resume at lower doses as needed to mitigate symptoms of restless legs, but the patient has not needed this frequently at home 4. Resume other home medications as noted and consider additional antidepressants to help with symptoms of depression and appetite 5. Continue on Carafate as well as hyoscyamine and Zofran for GI symptoms of gastritis and spasticity 6. She has been given physical prescription for oxycodone for 10 more tablets and 0 refills as needed for severe abdominal pain as well as physical prescription for benzodiazepine for as needed anxiety.  Home Health: None  Equipment/Devices: None  Discharge Condition: Stable  CODE STATUS: Full  Diet recommendation: Heart Healthy  Brief/Interim Summary: As per H&P written by Dr. Flossie Dibble on 12/26/2019 74 y.o.femalewith medical history significant ofCOPD O2 dependent 2 L at home, HTN, recent history of closed fracture of anterior left acetabulum, pelvic fracture,,chronic anemia, episodic delirium,depression, chronic hyponatremia, lupus, RLS .Marland KitchenEtc.  Presented:With abdominal associate with nausea vomiting unable to tolerate solids or liquids. Reporting diffuse abdominal pain worsening over the past 24 hours pain is most consistent in epigastric area and right area does not radiate to the back.  Not associate with any fever chills more nausea some vomiting not associate  with diarrhea or constipation. Reporting a mild dysuria,with no discharge or foul odor.  Patient Denies having: Fever, Chills, Cough, SOB, Chest Pain,, headache, dizziness, lightheadedness, Joint pain, rash, open wounds  -Patient was admitted with enterocolitis along with Klebsiella UTI. She is tolerating diet and has no further abdominal pain or nausea at this time.  She has completed a course of combination IV as well as oral antibiotics of ciprofloxacin and Flagyl.  She was noted to have some ongoing abdominal pain with poor appetite and struggles with some depression.  Her vital signs appear to be improved today and she is medically stable for discharge.  She needs intensive rehabilitation for which she will go to [facility] for rehab.  No other acute events noted throughout the course of this admission.  She is additionally noted to have intertrochanteric fracture of the left femur, but no misalignment or fracture noted and she will need ongoing PT for this as well.  Discharge Diagnoses:  Principal Problem:   Enterocolitis Active Problems:   Intertrochanteric fracture of left femur (HCC)   Fracture of acetabulum, left, closed (HCC)   Hypertension   COPD (chronic obstructive pulmonary disease) (HCC)   Depression   GERD (gastroesophageal reflux disease)   RLS (restless legs syndrome)   Anemia   Colitis  Principal discharge diagnosis: Enterocolitis along with Klebsiella UTI.  Noted intertrochanteric fracture of the left femur.  Discharge Instructions  Discharge Instructions    Diet - low sodium heart healthy   Complete by: As directed    Increase activity slowly   Complete by: As directed      Allergies as of 01/03/2020      Reactions   Amoxicillin Nausea Only   Cephalosporins Nausea Only   Clindamycin/lincomycin Nausea Only  Doxycycline Nausea Only   Penicillins Diarrhea   Patient states she is allergic to 'all antibiotics' states has diarrhea and nausea.       Medication List    STOP taking these medications   amLODipine 5 MG tablet Commonly known as: NORVASC   gabapentin 400 MG capsule Commonly known as: NEURONTIN   methocarbamol 500 MG tablet Commonly known as: ROBAXIN   rOPINIRole 2 MG tablet Commonly known as: REQUIP     TAKE these medications   acetaminophen 500 MG tablet Commonly known as: TYLENOL Take 2 tablets (1,000 mg total) by mouth 3 (three) times daily. What changed:   when to take this  reasons to take this   albuterol (2.5 MG/3ML) 0.083% nebulizer solution Commonly known as: PROVENTIL Take 3 mLs (2.5 mg total) by nebulization every 8 (eight) hours as needed for wheezing or shortness of breath.   alum & mag hydroxide-simeth 200-200-20 MG/5ML suspension Commonly known as: MAALOX/MYLANTA Take 30 mLs by mouth every 4 (four) hours as needed for indigestion.   Artificial Tears 1 % ophthalmic solution Generic drug: carboxymethylcellulose Place 2 drops into both eyes 3 (three) times daily. Instill 2 drops both eyes three times a day as needed for dry eyes   aspirin EC 81 MG tablet Take 81 mg by mouth 2 (two) times daily. QAM and QHS   Breztri Aerosphere 160-9-4.8 MCG/ACT Aero Generic drug: Budeson-Glycopyrrol-Formoterol Inhale 1 puff into the lungs daily.   buPROPion 300 MG 24 hr tablet Commonly known as: WELLBUTRIN XL Take 1 tablet (300 mg total) by mouth daily.   calcium carbonate 500 MG chewable tablet Commonly known as: TUMS - dosed in mg elemental calcium Chew 1 tablet by mouth 3 (three) times daily with meals.   clonazePAM 0.5 MG tablet Commonly known as: KLONOPIN Take 0.5 tablets (0.25 mg total) by mouth 2 (two) times daily as needed for anxiety. What changed: when to take this   docusate sodium 100 MG capsule Commonly known as: COLACE Take 100 mg by mouth daily.   DULoxetine 30 MG capsule Commonly known as: CYMBALTA Take 30 mg by mouth daily.   feeding supplement (ENSURE ENLIVE) Liqd Take  237 mLs by mouth 2 (two) times daily between meals.   guaiFENesin 600 MG 12 hr tablet Commonly known as: MUCINEX Take 2 tablets (1,200 mg total) by mouth 2 (two) times daily. What changed:   when to take this  reasons to take this   hyoscyamine 0.125 MG SL tablet Commonly known as: LEVSIN SL Take 1 tablet (0.125 mg total) by mouth every 8 (eight) hours as needed for cramping.   linaclotide 145 MCG Caps capsule Commonly known as: LINZESS Take 1 capsule (145 mcg total) by mouth daily before breakfast. Start taking on: January 04, 2020   magnesium hydroxide 400 MG/5ML suspension Commonly known as: MILK OF MAGNESIA Take 30 mLs by mouth daily as needed for moderate constipation.   ondansetron 4 MG tablet Commonly known as: ZOFRAN Take 1 tablet (4 mg total) by mouth every 6 (six) hours as needed for nausea or vomiting. What changed: Another medication with the same name was added. Make sure you understand how and when to take each.   ondansetron 4 MG tablet Commonly known as: ZOFRAN Take 1 tablet (4 mg total) by mouth every 6 (six) hours as needed for nausea. What changed: You were already taking a medication with the same name, and this prescription was added. Make sure you understand how and when to take  each.   oxyCODONE 5 MG immediate release tablet Commonly known as: Oxy IR/ROXICODONE Take 1 tablet (5 mg total) by mouth every 3 (three) hours as needed for moderate pain, severe pain or breakthrough pain.   pantoprazole 40 MG tablet Commonly known as: PROTONIX Take 1 tablet (40 mg total) by mouth daily.   psyllium 58.6 % packet Commonly known as: METAMUCIL Take 1 packet by mouth at bedtime.   simethicone 80 MG chewable tablet Commonly known as: MYLICON Chew 1 tablet (80 mg total) by mouth 3 (three) times daily as needed.   sucralfate 1 GM/10ML suspension Commonly known as: CARAFATE Take 10 mLs (1 g total) by mouth 4 (four) times daily -  with meals and at bedtime for 4  days.   Vitamin D-3 125 MCG (5000 UT) Tabs Give 5,000 units by mouth daily What changed:   how much to take  how to take this  when to take this  additional instructions      Contact information for after-discharge care    Destination    [facility] Preferred SNF .   Service: Skilled Nursing Contact information: [address] [place] [address] [phone]             Allergies  Allergen Reactions  . Amoxicillin Nausea Only  . Cephalosporins Nausea Only  . Clindamycin/Lincomycin Nausea Only  . Doxycycline Nausea Only  . Penicillins Diarrhea    Patient states she is allergic to 'all antibiotics' states has diarrhea and nausea.    Consultations:  None   Procedures/Studies: CT ABDOMEN PELVIS W CONTRAST  Result Date: 12/31/2019 CLINICAL DATA:  74 year old female with concern for abdominal infection. EXAM: CT ABDOMEN AND PELVIS WITH CONTRAST TECHNIQUE: Multidetector CT imaging of the abdomen and pelvis was performed using the standard protocol following bolus administration of intravenous contrast. CONTRAST:  OMNIPAQUE IOHEXOL 300 MG/ML  SOLN COMPARISON:  CT abdomen pelvis dated 12/26/2019. FINDINGS: Lower chest: Trace bilateral pleural effusions. There is calcification of the mitral annulus. No intra-abdominal free air. No free fluid. Hepatobiliary: The liver is unremarkable. There is mild intrahepatic biliary ductal dilatation. Cholecystectomy. No retained calcified stone noted in the central CBD. Pancreas: The pancreas is suboptimally visualized but appears unremarkable. Spleen: Normal in size without focal abnormality. Adrenals/Urinary Tract: The adrenal glands are unremarkable. Bilateral renal cortical irregularity. There is a 4 mm nonobstructing left renal interpolar stone. There is no hydronephrosis on either side. There is symmetric enhancement and excretion of contrast by both kidneys. The visualized ureters and urinary  bladder appear unremarkable. Stomach/Bowel: There is a moderate size hiatal hernia. There is mild thickened appearance of the distal stomach with probable mild inflammation. Although this may be partly related to underdistention findings concerning for gastritis. Clinical correlation is recommended. A 2.5 cm duodenal diverticulum is noted. There is sigmoid diverticulosis without active inflammatory changes. There is no bowel obstruction. Vascular/Lymphatic: Moderate aortoiliac atherosclerotic disease. The IVC is unremarkable. No portal venous gas. There is no adenopathy. Reproductive: Hysterectomy. Other: Mild diffuse subcutaneous edema. Musculoskeletal: Osteopenia with degenerative changes of the spine. Old left femoral neck fracture status post internal fixation. Old fracture of the greater trochanter of the right femur with incomplete union. Old left pubic bone fractures noted. No acute fracture. IMPRESSION: 1. Findings concerning for gastritis. Clinical correlation is recommended. 2. Moderate size hiatal hernia. 3. Sigmoid diverticulosis. No bowel obstruction. 4. A 4 mm nonobstructing left renal interpolar stone. No hydronephrosis. 5. Aortic Atherosclerosis (ICD10-I70.0). Electronically Signed  By: Elgie Collard M.D.   On: 12/31/2019 17:38   CT ABDOMEN PELVIS W CONTRAST  Addendum Date: 12/26/2019   ADDENDUM REPORT: 12/26/2019 04:35 ADDENDUM: 1 cm left adrenal nodule. In the absence of known cancer history, finding is likely benign such as an adrenal adenoma. Could consider 12 month follow-up adrenal CT to assess for stability. This recommendation follows ACR consensus guidelines: Management of Incidental Adrenal Masses: A White Paper of the ACR Incidental Findings Committee. J Am Coll Radiol 2017;14:1038-1044. Electronically Signed   By: Kreg Shropshire M.D.   On: 12/26/2019 04:35   Result Date: 12/26/2019 CLINICAL DATA:  Right abdominal pain with nausea EXAM: CT ABDOMEN AND PELVIS WITH CONTRAST  TECHNIQUE: Multidetector CT imaging of the abdomen and pelvis was performed using the standard protocol following bolus administration of intravenous contrast. CONTRAST:  OMNIPAQUE IOHEXOL 300 MG/ML  SOLN COMPARISON:  Right hip radiographs 12/16/2019 FINDINGS: Lower chest: Few fluid-filled airways are seen in the right lung base. Mild atelectatic changes. Lung bases otherwise clear without pleural effusion. Normal cardiac size. Likely calcification upon the mitral annulus. No pericardial effusion. Hepatobiliary: Focal fatty infiltration seen along the falciform ligament. Otherwise normal a Paddock attenuation concerning focal liver lesion. Smooth liver surface contour. Patient is post cholecystectomy. Slight prominence of the biliary tree likely related to reservoir effect. No calcified intraductal gallstones. Pancreas: Diffuse moderate atrophy of the pancreas without ductal dilatation or peripancreatic inflammation. Spleen: Normal in size without focal abnormality. Adrenals/Urinary Tract: 1 cm nodule in the body of the left adrenal gland, indeterminate (2/13). No right adrenal lesion. Kidneys enhance and excrete symmetrically. Scattered subcentimeter hypoattenuating foci in both kidneys too small to fully characterize on CT imaging but statistically likely benign. No worrisome renal mass. No obstructive urolithiasis or hydronephrosis. There is mild bladder wall thickening with some faint perivesicular stranding. Stomach/Bowel: Fluid-filled moderate hiatal hernia. Distal stomach and duodenum are unremarkable. Several clustered loops of fluid-filled small bowel seen in the left lower quadrant with associated hazy mesenteric stranding. No evidence of obstruction. Appendix is not visualized. The appendix is surgically absent. High attenuation material throughout the proximal colon may reflect recent ingestion of bismuth containing products. Some mild diffuse pancolonic edematous thickening is noted as well though  may be accentuated by underdistention. Scattered colonic diverticula without focal pericolonic inflammation to suggest diverticulitis. Vascular/Lymphatic: Atherosclerotic calcifications throughout the abdominal aorta and branch vessels. Mild aortic tortuosity. No aneurysm or ectasia. No enlarged abdominopelvic lymph nodes. Reproductive: Uterus is surgically absent. No concerning adnexal lesions. Other: Lentiform 4.4 x 1.9 x 6 cm fluid collection in the left hip likely related to prior left femur ORIF. Mid mesenteric hazy stranding, as above. No abdominopelvic free fluid or air. No bowel containing hernias. Musculoskeletal: The osseous structures appear diffusely demineralized which may limit detection of small or nondisplaced fractures. No acute osseous abnormality or suspicious osseous lesion. Stable appearance of an L1 compression deformity seen on comparison radiographs. Levocurvature of the lumbar spine is similar to priors. Slight rightward lateral listhesis of L2 on L3 is unchanged from comparison radiography. Multilevel discogenic and facet degenerative changes present throughout the lumbar spine. Prior left femoral ORIF. Fragmentation along the greater trochanter may be postsurgical. There is an age-indeterminate fracture of the right greater trochanter. IMPRESSION: 1. Several clustered loops of fluid-filled small bowel in the left lower quadrant as well as diffuse mild pancolonic edematous thickening with associated hazy mesenteric stranding, may represent a nonspecific enterocolitis. 2. Moderate fluid-filled hiatal hernia. Few fluid-filled airways in the right  lung base could reflect aspiration. 3. Colonic diverticulosis without evidence of diverticulitis. 4. Lentiform 4.4 x 1.9 x 6 cm fluid collection in the left hip likely related to prior left femur ORIF however sterility is not ascertained by imaging. 5. Age-indeterminate fracture of the right greater trochanter. Recommend correlation with point  tenderness. 6. Stable appearance of an L1 compression deformity seen on comparison radiographs. 7. Prior cholecystectomy, hysterectomy, appendectomy. 8. Aortic Atherosclerosis (ICD10-I70.0). Electronically Signed: By: Kreg Shropshire M.D. On: 12/26/2019 03:21   DG HIP UNILAT WITH PELVIS 2-3 VIEWS LEFT  Result Date: 12/16/2019 X-rays left hip status post hardware removal and blade plate ORIF at [facility] X-ray shows good hardware position no complications from the hardware the fractures has good alignment and appears to be healing well Impression stable fixation ORIF after hardware removal for failed hip fracture fixation left hip  DG HIP UNILAT WITH PELVIS 2-3 VIEWS RIGHT  Result Date: 12/16/2019 2-3 views right hip Right greater troches fracture Fracture is nondisplaced probably has fibrous union at this point no further displacement of the fracture is noted no other fractures are noted around the hip Impression greater trochanteric fracture right hip minimal displacement fibrous union    Discharge Exam: Vitals:   01/02/20 2132 01/03/20 0604  BP: (!) 126/56 (!) 97/52  Pulse: 96 89  Resp: 17 15  Temp: 99.2 F (37.3 C) 98.8 F (37.1 C)  SpO2: 100% (!) 84%   Vitals:   01/02/20 1532 01/02/20 2132 01/03/20 0500 01/03/20 0604  BP: 114/62 (!) 126/56  (!) 97/52  Pulse: 86 96  89  Resp: 18 17  15   Temp: 98.5 F (36.9 C) 99.2 F (37.3 C)  98.8 F (37.1 C)  TempSrc: Oral Oral  Axillary  SpO2: 100% 100%  (!) 84%  Weight:   59.1 kg   Height:        General: Pt is alert, awake, not in acute distress Cardiovascular: RRR, S1/S2 +, no rubs, no gallops Respiratory: CTA bilaterally, no wheezing, no rhonchi Abdominal: Soft, NT, ND, bowel sounds + Extremities: no edema, no cyanosis    The results of significant diagnostics from this hospitalization (including imaging, microbiology, ancillary and laboratory) are listed below for reference.     Microbiology: Recent Results (from the  past 240 hour(s))  Urine Culture     Status: Abnormal   Collection Time: 12/26/19  1:11 AM   Specimen: Urine, Random  Result Value Ref Range Status   Specimen Description   Final    URINE, RANDOM Performed at [facility], [address], [place], [place]    Special Requests   Final    NONE Performed at [facility], [address] [place], [place]    Culture (A)  Final    >=100,000 COLONIES/mL KLEBSIELLA PNEUMONIAE Two isolates with different morphologies were identified as the same organism.The most resistant organism was reported. Performed at [facility], [address] [place], [place]    Report Status 12/28/2019 FINAL  Final   Organism ID, Bacteria KLEBSIELLA PNEUMONIAE (A)  Final      Susceptibility   Klebsiella pneumoniae - MIC*    AMPICILLIN >=32 RESISTANT Resistant     CEFAZOLIN <=4 SENSITIVE Sensitive     CEFTRIAXONE <=1 SENSITIVE Sensitive     CIPROFLOXACIN <=0.25 SENSITIVE Sensitive     GENTAMICIN <=1 SENSITIVE Sensitive     IMIPENEM 1 SENSITIVE Sensitive     NITROFURANTOIN 64 INTERMEDIATE Intermediate  TRIMETH/SULFA <=20 SENSITIVE Sensitive     AMPICILLIN/SULBACTAM 4 SENSITIVE Sensitive     PIP/TAZO <=4 SENSITIVE Sensitive     * >=100,000 COLONIES/mL KLEBSIELLA PNEUMONIAE  SARS Coronavirus 2 by RT PCR (hospital order, performed in [facility] hospital lab) Nasopharyngeal Nasopharyngeal Swab     Status: None   Collection Time: 12/26/19  5:32 AM   Specimen: Nasopharyngeal Swab  Result Value Ref Range Status   SARS Coronavirus 2 NEGATIVE NEGATIVE Final    Comment: (NOTE) SARS-CoV-2 target nucleic acids are NOT DETECTED. The SARS-CoV-2 RNA is generally detectable in upper and lower respiratory specimens during the acute phase of infection. The lowest concentration of SARS-CoV-2 viral copies this assay can detect is 250 copies / mL. A negative result does not preclude SARS-CoV-2 infection and should not be used as the sole  basis for treatment or other patient management decisions.  A negative result may occur with improper specimen collection / handling, submission of specimen other than nasopharyngeal swab, presence of viral mutation(s) within the areas targeted by this assay, and inadequate number of viral copies (<250 copies / mL). A negative result must be combined with clinical observations, patient history, and epidemiological information. Fact Sheet for Patients:   BoilerBrush.com.cy Fact Sheet for Healthcare Providers: https://pope.com/ This test is not yet approved or cleared  by the [place] FDA and has been authorized for detection and/or diagnosis of SARS-CoV-2 by FDA under an Emergency Use Authorization (EUA).  This EUA will remain in effect (meaning this test can be used) for the duration of the COVID-19 declaration under Section 564(b)(1) of the Act, 21 U.S.C. section 360bbb-3(b)(1), unless the authorization is terminated or revoked sooner. Performed at [facility], [address] [place], [place] [address]      Labs: BNP (last 3 results) Recent Labs    12/26/19 0918  BNP 91.0   Basic Metabolic Panel: Recent Labs  Lab 12/30/19 0431 12/31/19 0437 01/01/20 0657 01/02/20 0500 01/03/20 0656  NA 135 135 138 137 136  K 4.8 3.9 4.6 5.1 4.2  CL 97* 98 98 99 98  CO2 32 31 34* 31 32  GLUCOSE 111* 99 112* 136* 106*  BUN <5* <5* <5* 9 15  CREATININE 0.51 0.52 0.55 0.78 0.63  CALCIUM 8.8* 8.6* 8.8* 8.9 8.5*  MG  --   --  1.5* 1.9  --    Liver Function Tests: Recent Labs  Lab 01/01/20 0657 01/02/20 0500  AST 14* 17  ALT 10 12  ALKPHOS 60 63  BILITOT 0.5 0.6  PROT 4.6* 5.3*  ALBUMIN 2.7* 3.0*   No results for input(s): LIPASE, AMYLASE in the last 168 hours. No results for input(s): AMMONIA in the last 168 hours. CBC: Recent Labs  Lab 12/30/19 0431 12/31/19 0437 01/01/20 0657 01/02/20 0500 01/03/20 0656  WBC 5.4 6.3  4.4 9.4 4.5  HGB 9.9* 9.5* 9.7* 10.7* 8.6*  HCT 33.3* 31.6* 31.5* 35.8* 29.0*  MCV 91.2 90.8 91.3 92.5 92.4  PLT 221 204 202 195 180   Cardiac Enzymes: No results for input(s): CKTOTAL, CKMB, CKMBINDEX, TROPONINI in the last 168 hours. BNP: Invalid input(s): POCBNP CBG: Recent Labs  Lab 12/28/19 0746 12/30/19 0744 01/01/20 0740 01/02/20 0831 01/02/20 2213  GLUCAP 114* 93 104* 116* 138*   D-Dimer No results for input(s): DDIMER in the last 72 hours. Hgb A1c No results for input(s): HGBA1C in the last 72 hours. Lipid Profile No results for input(s): CHOL, HDL, LDLCALC, TRIG, CHOLHDL, LDLDIRECT in the  last 72 hours. Thyroid function studies No results for input(s): TSH, T4TOTAL, T3FREE, THYROIDAB in the last 72 hours.  Invalid input(s): FREET3 Anemia work up No results for input(s): VITAMINB12, FOLATE, FERRITIN, TIBC, IRON, RETICCTPCT in the last 72 hours. Urinalysis    Component Value Date/Time   COLORURINE YELLOW 12/26/2019 0111   APPEARANCEUR CLEAR 12/26/2019 0111   LABSPEC 1.011 12/26/2019 0111   PHURINE 7.0 12/26/2019 0111   GLUCOSEU NEGATIVE 12/26/2019 0111   HGBUR NEGATIVE 12/26/2019 0111   BILIRUBINUR NEGATIVE 12/26/2019 0111   KETONESUR 5 (A) 12/26/2019 0111   PROTEINUR NEGATIVE 12/26/2019 0111   NITRITE POSITIVE (A) 12/26/2019 0111   LEUKOCYTESUR NEGATIVE 12/26/2019 0111   Sepsis Labs Invalid input(s): PROCALCITONIN,  WBC,  LACTICIDVEN Microbiology Recent Results (from the past 240 hour(s))  Urine Culture     Status: Abnormal   Collection Time: 12/26/19  1:11 AM   Specimen: Urine, Random  Result Value Ref Range Status   Specimen Description   Final    URINE, RANDOM Performed at [facility], [address] [place], [place] [address]    Special Requests   Final    NONE Performed at [facility], [address] [place], [place] [address]    Culture (A)  Final    >=100,000 COLONIES/mL KLEBSIELLA PNEUMONIAE Two isolates with different morphologies  were identified as the same organism.The most resistant organism was reported. Performed at [facility], [address] [place], [place] [address]    Report Status 12/28/2019 FINAL  Final   Organism ID, Bacteria KLEBSIELLA PNEUMONIAE (A)  Final      Susceptibility   Klebsiella pneumoniae - MIC*    AMPICILLIN >=32 RESISTANT Resistant     CEFAZOLIN <=4 SENSITIVE Sensitive     CEFTRIAXONE <=1 SENSITIVE Sensitive     CIPROFLOXACIN <=0.25 SENSITIVE Sensitive     GENTAMICIN <=1 SENSITIVE Sensitive     IMIPENEM 1 SENSITIVE Sensitive     NITROFURANTOIN 64 INTERMEDIATE Intermediate     TRIMETH/SULFA <=20 SENSITIVE Sensitive     AMPICILLIN/SULBACTAM 4 SENSITIVE Sensitive     PIP/TAZO <=4 SENSITIVE Sensitive     * >=100,000 COLONIES/mL KLEBSIELLA PNEUMONIAE  SARS Coronavirus 2 by RT PCR (hospital order, performed [facility] lab) Nasopharyngeal Nasopharyngeal Swab     Status: None   Collection Time: 12/26/19  5:32 AM   Specimen: Nasopharyngeal Swab  Result Value Ref Range Status   SARS Coronavirus 2 NEGATIVE NEGATIVE Final    Comment: (NOTE) SARS-CoV-2 target nucleic acids are NOT DETECTED. The SARS-CoV-2 RNA is generally detectable in upper and lower respiratory specimens during the acute phase of infection. The lowest concentration of SARS-CoV-2 viral copies this assay can detect is 250 copies / mL. A negative result does not preclude SARS-CoV-2 infection and should not be used as the sole basis for treatment or other patient management decisions.  A negative result may occur with improper specimen collection / handling, submission of specimen other than nasopharyngeal swab, presence of viral mutation(s) within the areas targeted by this assay, and inadequate number of viral copies (<250 copies / mL). A negative result must be combined with clinical observations, patient history, and epidemiological information. Fact Sheet for Patients:    BoilerBrush.com.cyhttps://www.fda.gov/media/136312/download Fact Sheet for Healthcare Providers: https://pope.com/https://www.fda.gov/media/136313/download This test is not yet approved or cleared  by the [place] FDA and has been authorized for detection and/or diagnosis of SARS-CoV-2 by FDA under an Emergency Use Authorization (EUA).  This EUA will remain in effect (  meaning this test can be used) for the duration of the COVID-19 declaration under Section 564(b)(1) of the Act, 21 U.S.C. section 360bbb-3(b)(1), unless the authorization is terminated or revoked sooner. Performed at [facility], [address] [place], [place] [address]      Time coordinating discharge: 40 minutes  SIGNED:   Rodena Goldmann, DO [facility] 01/03/2020, 11:32 AM  If 7PM-7AM, please contact night-coverage [email or web address]

## 2020-01-05 ENCOUNTER — Non-Acute Institutional Stay (SKILLED_NURSING_FACILITY): Payer: Medicare Other | Admitting: Internal Medicine

## 2020-01-05 DIAGNOSIS — G2581 Restless legs syndrome: Secondary | ICD-10-CM

## 2020-01-05 DIAGNOSIS — J449 Chronic obstructive pulmonary disease, unspecified: Secondary | ICD-10-CM | POA: Diagnosis not present

## 2020-01-05 DIAGNOSIS — I10 Essential (primary) hypertension: Secondary | ICD-10-CM | POA: Diagnosis not present

## 2020-01-05 DIAGNOSIS — K529 Noninfective gastroenteritis and colitis, unspecified: Secondary | ICD-10-CM | POA: Diagnosis not present

## 2020-01-05 DIAGNOSIS — F419 Anxiety disorder, unspecified: Secondary | ICD-10-CM

## 2020-01-05 NOTE — Progress Notes (Signed)
This is an acute visit.  Level of care skilled.  Facility is [facility].  Chief complaint acute visit status post hospitalization for colitis as well as UTI.  History of present illness.  Patient is a 74 year old female who is here for rehab and strengthening after hospitalization for what was diagnosed as enterocolitis as well as Klebsiella UTI.  Patient has a previous history of COPD oxygen dependent as well as hypertension-recent history of a closed fracture of the anterior left acetabulum  as well as a pelvic fracture--left intertrochanteric fracture in addition to chronic anemia and episodic delirium as well as depression and chronic hyponatremia as well as a history of lupus and restless leg syndrome.  She presented to the hospital with nausea and vomiting unable to have p.o. intake.  She also reported diffuse abdominal pain.  She was admitted with the diagnosis of enterocolitis along with Klebsiella UTI.  She was treated with Cipro and Flagyl  And apparently has shown some improvement although she does continue with a poor appetite per discharge summary.  She is here for rehab.  Her other medical issues appear to be stable it appears her Norvasc was held secondary to soft blood pressures.  It also appears her Neurontin Robaxin and Requip were held secondary to fatigue and lightheadedness and dizziness.  There is recommendation to possibly restart at lower doses as needed.      Currently she is resting in bed she appears to be comfortable but does complain of some pain and would like to get her OxyIR-there are orders for this every 3 hours as needed   Past Medical History:  Diagnosis Date   Anemia    At risk for delirium    Closed fracture of anterior column of left acetabulum (HCC)    Closed fracture of pelvis (HCC)    COPD (chronic obstructive pulmonary disease) (HCC)    Depression    History of rheumatic fever    HTN (hypertension)     Hyponatremia    Lupus (HCC)    RLS (restless legs syndrome)          Past Surgical History:  Procedure Laterality Date   ABDOMINAL HYSTERECTOMY     APPENDECTOMY     BREAST SURGERY     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 She has never used smokeless tobacco. She reports that she does not drink alcohol or use drugs.   MEDICATIONS     acetaminophen 500 MG tablet Commonly known as: TYLENOL Take 2 tablets (1,000 mg total) by mouth 3 (three) times daily. What changed:   when to take this  reasons to take this   albuterol (2.5 MG/3ML) 0.083% nebulizer solution Commonly known as: PROVENTIL Take 3 mLs (2.5 mg total) by nebulization every 8 (eight) hours as needed for wheezing or shortness of breath.   alum & mag hydroxide-simeth 200-200-20 MG/5ML suspension Commonly known as: MAALOX/MYLANTA Take 30 mLs by mouth every 4 (four) hours as needed for indigestion.   Artificial Tears 1 % ophthalmic solution Generic drug: carboxymethylcellulose Place 2 drops into both eyes 3 (three) times daily. Instill 2 drops  both eyes three times a day as needed for dry eyes   aspirin EC 81 MG tablet Take 81 mg by mouth 2 (two) times daily. QAM and QHS   Breztri Aerosphere 160-9-4.8 MCG/ACT Aero Generic drug: Budeson-Glycopyrrol-Formoterol Inhale 1 puff into the lungs daily.   buPROPion 300 MG 24 hr tablet Commonly known as: WELLBUTRIN XL Take 1 tablet (300 mg total) by mouth daily.   calcium carbonate 500 MG chewable tablet Commonly known as: TUMS - dosed in mg elemental calcium Chew 1 tablet by mouth 3 (three) times daily with  meals.   clonazePAM 0.5 MG tablet Commonly known as: KLONOPIN Take 0.5 tablets (0.25 mg total) by mouth 2 (two) times daily as needed for anxiety. What changed: when to take this   docusate sodium 100 MG capsule Commonly known as: COLACE Take 100 mg by mouth daily.   DULoxetine 30 MG capsule Commonly known as: CYMBALTA Take 30 mg by mouth daily.   feeding supplement (ENSURE ENLIVE) Liqd Take 237 mLs by mouth 2 (two) times daily between meals.   guaiFENesin 600 MG 12 hr tablet Commonly known as: MUCINEX Take 2 tablets (1,200 mg total) by mouth 2 (two) times daily. What changed:   when to take this  reasons to take this   hyoscyamine 0.125 MG SL tablet Commonly known as: LEVSIN SL Take 1 tablet (0.125 mg total) by mouth every 8 (eight) hours as needed for cramping.   linaclotide 145 MCG Caps capsule Commonly known as: LINZESS Take 1 capsule (145 mcg total) by mouth daily before breakfast. Start taking on: January 04, 2020   magnesium hydroxide 400 MG/5ML suspension Commonly known as: MILK OF MAGNESIA Take 30 mLs by mouth daily as needed for moderate constipation.   ondansetron 4 MG tablet Commonly known as: ZOFRAN Take 1 tablet (4 mg total) by mouth every 6 (six) hours as needed for nausea or vomiting. What changed: Another medication with the same name was added. Make sure you understand how and when to take each.   ondansetron 4 MG tablet Commonly known as: ZOFRAN Take 1 tablet (4 mg total) by mouth every 6 (six) hours as needed for nausea. What changed: You were already taking a medication with the same name, and this prescription was added. Make sure you understand how and when to take each.   oxyCODONE 5 MG immediate release tablet Commonly known as: Oxy IR/ROXICODONE Take 1 tablet (5 mg total) by mouth every 3 (three) hours as needed for moderate pain, severe pain or breakthrough pain.   pantoprazole 40 MG tablet Commonly known as: PROTONIX Take 1  tablet (40 mg total) by mouth daily.   psyllium 58.6 % packet Commonly known as: METAMUCIL Take 1 packet by mouth at bedtime.   simethicone 80 MG chewable tablet Commonly known as: MYLICON Chew 1 tablet (80 mg total) by mouth 3 (three) times daily as needed.   sucralfate 1 GM/10ML suspension Commonly known as: CARAFATE Take 10 mLs (1 g total) by mouth 4 (four) times daily -  with meals and at bedtime for 4 days.   Vitamin D-3 125 MCG (5000 UT) Tabs Give 5,000 units by mouth daily What changed:   how much to take  how to take this  when to take this  additional instructions         Contact information for after-discharge care        Destination        [facility] Preferred SNF .  Service: Skilled Nursing Contact information: [address] [place] [address] [phone]                   Allergies  Allergen Reactions   Amoxicillin Nausea Only   Cephalosporins Nausea Only   Clindamycin/Lincomycin Nausea Only   Doxycycline Nausea Only   Penicillins Diarrhea    Patient states she is allergic to 'all antibiotics' states has diarrhea and nausea.    Consultations:  None  Review of systems.  In general she is not complaining of any fever or chills.  Skin does not complain of rashes or itching.  Head ears eyes nose mouth and throat is not complain of visual changes or sore throat.  Respiratory is not complaining of being short of breath at this point she does have a history of COPD on chronic oxygen-does not complain of increasing cough.  Cardiac is not complaining of chest pain or increasing edema.  GI is not complaining of nausea or vomiting diarrhea constipation says she does have some residual abdominal pain-soreness-.  GU is not complaining of dysuria.  Musculoskeletal does have a history of chronic pain with history of recent pelvic fracture-indicates as needed pain medications do  help.  Neurologic is not complaining of dizziness headache numbness does have weakness.  And psych does have  History of depression and anxiety does not complain of overt depression or anxiety at this point.  .  Physical exam.  Temperature 96.9 pulse 95 respirations 18 blood pressure 136/87.  In general this is a pleasant elderly female in no distress lying comfortably in bed.  Her skin is warm and dry she does have patches applied to both sides of heels bilaterally-this is followed by wound care.  Eyes visual acuity appears to be intact sclera and conjunctive are clear.  Oropharynx is clear mucous membranes moist.  Chest is clear to auscultation with somewhat shallow air entry she coughs with deep inspiration-this is fairly baseline with previous exams from when she was here.  Heart is regular rate and rhythm without murmur gallop or rub.  Abdomen is soft there is some mild diffuse tenderness to palpation bowel sounds are active.  Musculoskeletal Limited exam since he is in bed but is able to move all extremities x4 with what appears to be some lower extremity weakness.  Neurologic as noted above cannot really appreciate lateralizing findings her speech is clear.  Psych she is alert and oriented pleasant and appropriate.  Labs.  January 03, 1920.  WBC 4.5 hemoglobin 8.6 platelets 180.  Sodium 136 potassium 4.2 BUN 15 creatinine 0.63.  January 02, 2020.  Magnesium 1.9.  .  Assessment and plan.  1.  History of oral colitis-complicated with Klebsiella UTI she has responded to antibiotics including Cipro and Flagyl-she will need continued PT and OT-continues to have some residual abdominal soreness at this point will monitor apparently this has shown improvement.  2.  History ofFracture of acetabulum, left,-as well as intertrochanteric fracture --- at this point emphasis on PT and OT as well as pain control she does have orders for OxyIR 5 mg every 3 hours as needed in  addition to Tylenol 1000 mg 3 times daily will monitor.-.  She continues on aspirin 81 mg twice daily  3.  History of COPD she appears to be at baseline she does have orders for albuterol nebs every 8 hours as needed as well as Mucinex 1200 mg twice daily and [employer] daily.  4.  History of  fatigue as noted above her Neurontin Robaxin and Requip have been discontinued with recommendation to possibly restart at lower dose as tolerated-at this point will monitor.  5.  History of depression she continues on Wellbutrin XL 300 mg a day and Cymbalta 30 mg a day at this point we will continue to [facility] was some thought in the hospital apparently depression was contributing to poor appetite this will have to be watched-.  6.  History of anxiety she continues on Klonopin 0.25 mg twice daily as needed.  7.  History of abdominal cramping she continues on Levsin 0.125 mg every 8 hours as needed.  8.  History of constipation she is on Colace 100 mg a day as well as the Linzess 145 mcg daily and Metamucil at night.  9.  History of gastroparesis she is on Carafate 1 g 4 times daily.  10.  History of chronic anemia last hemoglobin was 8.6 and appears baseline has been in the low nines to 10 area recently will have this updated.  11.  History of hypertension Norvasc was held in the hospital because of soft blood pressures at this point will monitor blood pressure today 136/87.  12.  History of chronic hyponatremia last sodium was 136 on June 5-will update this this week.  13.  History of restless leg syndrome her Requip again was discontinued in the hospital will monitor to see if this needs to be restarted.  [id number] note greater than 40 minutes spent reviewing chart assessing patient and formulating a plan of care for numerous diagnoses-of note greater than 50% of time spent coordinating plan of care with input as noted above

## 2020-01-06 ENCOUNTER — Encounter: Payer: Self-pay | Admitting: Internal Medicine

## 2020-01-06 ENCOUNTER — Non-Acute Institutional Stay (SKILLED_NURSING_FACILITY): Payer: Medicare Other | Admitting: Internal Medicine

## 2020-01-06 DIAGNOSIS — G629 Polyneuropathy, unspecified: Secondary | ICD-10-CM

## 2020-01-06 DIAGNOSIS — K529 Noninfective gastroenteritis and colitis, unspecified: Secondary | ICD-10-CM | POA: Diagnosis not present

## 2020-01-06 DIAGNOSIS — D649 Anemia, unspecified: Secondary | ICD-10-CM

## 2020-01-06 DIAGNOSIS — J449 Chronic obstructive pulmonary disease, unspecified: Secondary | ICD-10-CM

## 2020-01-06 DIAGNOSIS — I10 Essential (primary) hypertension: Secondary | ICD-10-CM | POA: Diagnosis not present

## 2020-01-06 DIAGNOSIS — G2581 Restless legs syndrome: Secondary | ICD-10-CM | POA: Diagnosis not present

## 2020-01-06 DIAGNOSIS — K219 Gastro-esophageal reflux disease without esophagitis: Secondary | ICD-10-CM

## 2020-01-06 DIAGNOSIS — S72142S Displaced intertrochanteric fracture of left femur, sequela: Secondary | ICD-10-CM

## 2020-01-06 MED ORDER — ACETAMINOPHEN 500 MG PO TABS: 1000.0000 mg | ORAL_TABLET | Freq: Three times a day (TID) | ORAL | 5 refills | Status: DC

## 2020-01-06 MED ORDER — GUAIFENESIN ER 600 MG PO TB12: 1200.0000 mg | ORAL_TABLET | Freq: Two times a day (BID) | ORAL | 5 refills | Status: AC | PRN

## 2020-01-06 NOTE — Progress Notes (Signed)
Provider:  Rexene Edison. Mariea Clonts, D.O., C.M.D. Location:  [place] Room Number: [phone] Place of Service:  SNF (31)  PCP: Lucia Gaskins, MD Patient Care Team: Lucia Gaskins, MD as PCP - General (Internal Medicine) Gayland Curry, DO as Consulting Physician (Geriatric Medicine) Rehab, [place] ([place])  Extended Emergency Contact Information Primary Emergency Contact: Wirtz,Jill Address: [address]          [place], [place] [address] [place] Mobile Phone: [phone] Relation: Relative Preferred language: English Interpreter needed? No Secondary Emergency Contact: [place] Mobile Phone: [phone] Relation: Son  Code Status: full code Goals of Care: Advanced Directive information Advanced Directives 01/06/2020  Does Patient Have a Medical Advance Directive? No  Type of Advance Directive -  Does patient want to make changes to medical advance directive? -  Would patient like information on creating a medical advance directive? No - Patient declined    Chief Complaint  Patient presents with  . New Admit To SNF    New Admit to SNF     HPI: Patient is a 74 y.o. female with h/o copd and chronic respiratory failure w/ hypoxia, GERD, recent pelvic and left femur fxs, depression, hyponatremia, RLS, and anemia seen today for admission to [facility] s/p hospitalization thru 6/5 with abdominal pain, nausea, vomiting. She was found to have enterocolitis and treated with IVFs, cipro and flagyl.  Her amlodipine was held due to hypotension and her methocarbamol and requip held due to her sedation at the time.  She is alert and conversive now.  She's been getting oxycodone 5mg  every 3 hours as needed for pain (at the hospital, but getting actually every 6 hrs here per her floor nurse).  She has been on low dose cymbalta for chronic pain and depression.  She has also been getting hyoscyamine and  zofran which we'll limit the zofran to 72 hrs.  She does c/o her restless legs bothering her last night and keeping her awake.  She still has some chronic pain in her hip and pelvis.  Her abdomen remains a bit tender.  She is not having loose stools now though.  Her intake remains poor.  She's been placed on bid ensure.    Past Medical History:  Diagnosis Date  . Anemia   . At risk for delirium   . Closed fracture of anterior column of left acetabulum (HCC)   . Closed fracture of pelvis ([place])   . COPD (chronic obstructive pulmonary disease) ([place])   . Depression   . History of rheumatic fever   . HTN (hypertension)   . Hyponatremia   . Lupus ([place])   . RLS (restless legs syndrome)    Past Surgical History:  Procedure Laterality Date  . ABDOMINAL HYSTERECTOMY    . APPENDECTOMY    . 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 CLOSED MANIPULATION AND CLOSED TREATMENT OF LEFT ACETABULUM FRACTURE, BOTH COLUMNS Left   . HIP SURGERY Left   . REMOVAL OF DEEP HARDWARE, LEFT INTRAMEDULLARY NAIL Left    TFN  . 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 TONSILLECTOMY      Social History   Socioeconomic History  . Marital status: Widowed    Spouse name: Not on file  . Number of children: Not on file  . Years of education: Not on file  .  Highest education level: Not on file  Occupational History  . Not on file  Tobacco Use  . Smoking status: Former Games developer  . Smokeless tobacco: Never Used  Vaping Use  . Vaping Use: Never used  Substance and Sexual Activity  . Alcohol use: Never  . Drug use: Never  . Sexual activity: Not on file  Other Topics Concern  . Not on file  Social History Narrative  . Not on file   Social Determinants of Health   Financial Resource Strain:   . Difficulty of Paying Living Expenses:   Food Insecurity:   . Worried About Programme researcher, broadcasting/film/video in the Last Year:   . Barista in the Last Year:     Transportation Needs:   . Freight forwarder (Medical):   Marland Kitchen Lack of Transportation (Non-Medical):   Physical Activity:   . Days of Exercise per Week:   . Minutes of Exercise per Session:   Stress:   . Feeling of Stress :   Social Connections:   . Frequency of Communication with Friends and Family:   . Frequency of Social Gatherings with Friends and Family:   . Attends Religious Services:   . Active Member of Clubs or Organizations:   . Attends Banker Meetings:   Marland Kitchen Marital Status:     reports that she has quit smoking. She has never used smokeless tobacco. She reports that she does not drink alcohol and does not use drugs.  Functional Status Survey:    History reviewed. No pertinent family history.  Health Maintenance  Topic Date Due  . Hepatitis C Screening  Never done  . COVID-19 Vaccine (1) Never done  . TETANUS/TDAP  Never done  . COLONOSCOPY  Never done  . DEXA SCAN  Never done  . PNA vac Low Risk Adult (1 of 2 - PCV13) Never done  . INFLUENZA VACCINE  02/29/2020    Allergies  Allergen Reactions  . Amoxicillin Nausea Only  . Cephalosporins Nausea Only  . Clindamycin/Lincomycin Nausea Only  . Doxycycline Nausea Only  . Penicillins Diarrhea    Patient states she is allergic to 'all antibiotics' states has diarrhea and nausea.    Outpatient Encounter Medications as of 01/06/2020  Medication Sig  . acetaminophen (TYLENOL) 500 MG tablet Take 2 tablets (1,000 mg total) by mouth 3 (three) times daily.  Marland Kitchen albuterol (PROVENTIL) (2.5 MG/3ML) 0.083% nebulizer solution Take 3 mLs (2.5 mg total) by nebulization every 8 (eight) hours as needed for wheezing or shortness of breath.  Marland Kitchen alum & mag hydroxide-simeth (MAALOX/MYLANTA) 200-200-20 MG/5ML suspension Take 30 mLs by mouth every 4 (four) hours as needed for indigestion.  Marland Kitchen aspirin EC 81 MG tablet Take 81 mg by mouth 2 (two) times daily. QAM and QHS  . Budeson-Glycopyrrol-Formoterol (BREZTRI AEROSPHERE)  160-9-4.8 MCG/ACT AERO Inhale 1 puff into the lungs daily.  Marland Kitchen buPROPion (WELLBUTRIN XL) 300 MG 24 hr tablet Take 1 tablet (300 mg total) by mouth daily.  . calcium carbonate (TUMS - DOSED IN MG ELEMENTAL CALCIUM) 500 MG chewable tablet Chew 1 tablet by mouth 3 (three) times daily with meals.   . carboxymethylcellulose (ARTIFICIAL TEARS) 1 % ophthalmic solution Place 2 drops into both eyes 3 (three) times daily. Instill 2 drops both eyes three times a day as needed for dry eyes  . Cholecalciferol (VITAMIN D-3) 125 MCG (5000 UT) TABS Give 5,000 units by mouth daily (Patient taking differently: Take 5,000 Units by mouth daily. )  .  clonazePAM (KLONOPIN) 0.5 MG tablet Take 0.5 tablets (0.25 mg total) by mouth 2 (two) times daily as needed for anxiety.  . docusate sodium (COLACE) 100 MG capsule Take 100 mg by mouth daily.  . DULoxetine (CYMBALTA) 30 MG capsule Take 30 mg by mouth daily.  . feeding supplement, ENSURE ENLIVE, (ENSURE ENLIVE) LIQD Take 237 mLs by mouth 2 (two) times daily between meals.  Marland Kitchen guaiFENesin (MUCINEX) 600 MG 12 hr tablet Take 2 tablets (1,200 mg total) by mouth 2 (two) times daily as needed for cough or to loosen phlegm.  . hyoscyamine (LEVSIN SL) 0.125 MG SL tablet Take 1 tablet (0.125 mg total) by mouth every 8 (eight) hours as needed for cramping.  . linaclotide (LINZESS) 145 MCG CAPS capsule Take 1 capsule (145 mcg total) by mouth daily before breakfast.  . magnesium hydroxide (MILK OF MAGNESIA) 400 MG/5ML suspension Take 30 mLs by mouth daily as needed for moderate constipation.  . ondansetron (ZOFRAN) 4 MG tablet Take 1 tablet (4 mg total) by mouth every 6 (six) hours as needed for nausea or vomiting.  Marland Kitchen oxyCODONE (OXY IR/ROXICODONE) 5 MG immediate release tablet Take 1 tablet (5 mg total) by mouth every 3 (three) hours as needed for moderate pain, severe pain or breakthrough pain.  . pantoprazole (PROTONIX) 40 MG tablet Take 1 tablet (40 mg total) by mouth daily.  . psyllium  (METAMUCIL) 58.6 % packet Take 1 packet by mouth at bedtime.  . simethicone (MYLICON) 80 MG chewable tablet Chew 1 tablet (80 mg total) by mouth 3 (three) times daily as needed.  . sucralfate (CARAFATE) 1 GM/10ML suspension Take 10 mLs (1 g total) by mouth 4 (four) times daily -  with meals and at bedtime for 4 days.  . [DISCONTINUED] acetaminophen (TYLENOL) 500 MG tablet Take 2 tablets (1,000 mg total) by mouth 3 (three) times daily. (Patient taking differently: Take 1,000 mg by mouth every 8 (eight) hours as needed. )  . [DISCONTINUED] guaiFENesin (MUCINEX) 600 MG 12 hr tablet Take 2 tablets (1,200 mg total) by mouth 2 (two) times daily. (Patient taking differently: Take 1,200 mg by mouth 2 (two) times daily as needed for cough or to loosen phlegm. )  . [DISCONTINUED] ondansetron (ZOFRAN) 4 MG tablet Take 1 tablet (4 mg total) by mouth every 6 (six) hours as needed for nausea.   No facility-administered encounter medications on file as of 01/06/2020.    Review of Systems  Constitutional: Positive for malaise/fatigue. Negative for chills, fever and weight loss.  HENT: Negative for congestion and sore throat.   Eyes: Negative for blurred vision.  Respiratory: Positive for cough and shortness of breath. Negative for wheezing.        COPD  Cardiovascular: Negative for chest pain, palpitations and leg swelling.  Gastrointestinal: Positive for abdominal pain and nausea. Negative for blood in stool, constipation, diarrhea, heartburn, melena and vomiting.  Genitourinary: Negative for dysuria.  Musculoskeletal: Positive for falls, joint pain and myalgias.  Skin: Negative for itching and rash.  Neurological: Positive for tingling and sensory change. Negative for dizziness and loss of consciousness.       Restless legs  Endo/Heme/Allergies: Bruises/bleeds easily.  Psychiatric/Behavioral: Positive for depression. The patient has insomnia.     Vitals:   01/06/20 1435  BP: (!) 158/91  Pulse: (!) 102    Temp: (!) 97.3 F (36.3 C)  Weight: 130 lb (59 kg)  Height: 5' (1.524 m)   Body mass index is 25.39 kg/m. Physical Exam  Constitutional:      General: She is not in acute distress.    Appearance: She is not toxic-appearing.  HENT:     Head: Normocephalic and atraumatic.     Right Ear: External ear normal.     Left Ear: External ear normal.     Nose: Nose normal.     Mouth/Throat:     Pharynx: Oropharynx is clear.  Eyes:     Extraocular Movements: Extraocular movements intact.     Conjunctiva/sclera: Conjunctivae normal.     Pupils: Pupils are equal, round, and reactive to light.  Cardiovascular:     Rate and Rhythm: Normal rate and regular rhythm.     Pulses: Normal pulses.     Heart sounds: Normal heart sounds.     Comments: When I laid eyes on her Pulmonary:     Effort: Pulmonary effort is normal.     Breath sounds: Normal breath sounds. No wheezing, rhonchi or rales.  Abdominal:     General: Bowel sounds are normal. There is no distension.     Palpations: Abdomen is soft. There is no mass.     Tenderness: There is abdominal tenderness. There is no guarding or rebound.  Musculoskeletal:     Cervical back: Neck supple.     Comments: Left hip cicatrix healed  Skin:    General: Skin is warm and dry.  Neurological:     General: No focal deficit present.     Mental Status: She is alert and oriented to person, place, and time.     Motor: Weakness present.     Gait: Gait abnormal.  Psychiatric:     Comments: Pleasant but seems frustrated     Labs reviewed: Basic Metabolic Panel: Recent Labs    12/26/19 0918 12/27/19 0724 01/01/20 0657 01/02/20 0500 01/03/20 0656  NA  --    < > 138 137 136  K  --    < > 4.6 5.1 4.2  CL  --    < > 98 99 98  CO2  --    < > 34* 31 32  GLUCOSE  --    < > 112* 136* 106*  BUN  --    < > <5* 9 15  CREATININE  --    < > 0.55 0.78 0.63  CALCIUM 8.5*   < > 8.8* 8.9 8.5*  MG 1.6*  --  1.5* 1.9  --   PHOS 3.3  --   --   --   --    <  > = values in this interval not displayed.   Liver Function Tests: Recent Labs    12/26/19 0110 01/01/20 0657 01/02/20 0500  AST 13* 14* 17  ALT 11 10 12   ALKPHOS 74 60 63  BILITOT 0.7 0.5 0.6  PROT 6.0* 4.6* 5.3*  ALBUMIN 3.4* 2.7* 3.0*   Recent Labs    12/26/19 0110  LIPASE 22   No results for input(s): AMMONIA in the last 8760 hours. CBC: Recent Labs    10/27/19 1123 10/27/19 1123 10/28/19 0444 10/29/19 0440 12/26/19 0110 12/27/19 0724 01/01/20 0657 01/02/20 0500 01/03/20 0656  WBC 7.7   < > 6.4   < > 5.2   < > 4.4 9.4 4.5  NEUTROABS 6.0  --  4.8  --  4.0  --   --   --   --   HGB 9.4*   < > 8.8*   < > 10.4*   < > 9.7*  10.7* 8.6*  HCT 32.3*   < > 29.9*   < > 35.3*   < > 31.5* 35.8* 29.0*  MCV 93.1   < > 94.0   < > 91.0   < > 91.3 92.5 92.4  PLT 249   < > 227   < > 256   < > 202 195 180   < > = values in this interval not displayed.   Cardiac Enzymes: No results for input(s): CKTOTAL, CKMB, CKMBINDEX, TROPONINI in the last 8760 hours. BNP: Invalid input(s): POCBNP Lab Results  Component Value Date   HGBA1C 5.3 12/26/2019   Lab Results  Component Value Date   TSH 1.621 12/26/2019   Lab Results  Component Value Date   VITAMINB12 232 10/28/2019   Lab Results  Component Value Date   FOLATE 14.0 10/28/2019   Lab Results  Component Value Date   IRON 19 (L) 10/28/2019   TIBC 356 10/28/2019   FERRITIN 18 10/28/2019    Imaging and Procedures obtained prior to SNF admission: CT ABDOMEN PELVIS W CONTRAST  Addendum Date: 12/26/2019   ADDENDUM REPORT: 12/26/2019 04:35 ADDENDUM: 1 cm left adrenal nodule. In the absence of known cancer history, finding is likely benign such as an adrenal adenoma. Could consider 12 month follow-up adrenal CT to assess for stability. This recommendation follows ACR consensus guidelines: Management of Incidental Adrenal Masses: A White Paper of the ACR Incidental Findings Committee. J Am Coll Radiol 2017;14:1038-1044.  Electronically Signed   By: Kreg Shropshire M.D.   On: 12/26/2019 04:35   Result Date: 12/26/2019 CLINICAL DATA:  Right abdominal pain with nausea EXAM: CT ABDOMEN AND PELVIS WITH CONTRAST TECHNIQUE: Multidetector CT imaging of the abdomen and pelvis was performed using the standard protocol following bolus administration of intravenous contrast. CONTRAST:  OMNIPAQUE IOHEXOL 300 MG/ML  SOLN COMPARISON:  Right hip radiographs 12/16/2019 FINDINGS: Lower chest: Few fluid-filled airways are seen in the right lung base. Mild atelectatic changes. Lung bases otherwise clear without pleural effusion. Normal cardiac size. Likely calcification upon the mitral annulus. No pericardial effusion. Hepatobiliary: Focal fatty infiltration seen along the falciform ligament. Otherwise normal a P[place]ock attenuation concerning focal liver lesion. Smooth liver surface contour. Patient is post cholecystectomy. Slight prominence of the biliary tree likely related to reservoir effect. No calcified intraductal gallstones. Pancreas: Diffuse moderate atrophy of the pancreas without ductal dilatation or peripancreatic inflammation. Spleen: Normal in size without focal abnormality. Adrenals/Urinary Tract: 1 cm nodule in the body of the left adrenal gland, indeterminate (2/13). No right adrenal lesion. Kidneys enhance and excrete symmetrically. Scattered subcentimeter hypoattenuating foci in both kidneys too small to fully characterize on CT imaging but statistically likely benign. No worrisome renal mass. No obstructive urolithiasis or hydronephrosis. There is mild bladder wall thickening with some faint perivesicular stranding. Stomach/Bowel: Fluid-filled moderate hiatal hernia. Distal stomach and duodenum are unremarkable. Several clustered loops of fluid-filled small bowel seen in the left lower quadrant with associated hazy mesenteric stranding. No evidence of obstruction. Appendix is not visualized. The appendix is surgically absent.  High attenuation material throughout the proximal colon may reflect recent ingestion of bismuth containing products. Some mild diffuse pancolonic edematous thickening is noted as well though may be accentuated by underdistention. Scattered colonic diverticula without focal pericolonic inflammation to suggest diverticulitis. Vascular/Lymphatic: Atherosclerotic calcifications throughout the abdominal aorta and branch vessels. Mild aortic tortuosity. No aneurysm or ectasia. No enlarged abdominopelvic lymph nodes. Reproductive: Uterus is surgically absent. No concerning adnexal lesions. Other: Lentiform 4.4  x 1.9 x 6 cm fluid collection in the left hip likely related to prior left femur ORIF. Mid mesenteric hazy stranding, as above. No abdominopelvic free fluid or air. No bowel containing hernias. Musculoskeletal: The osseous structures appear diffusely demineralized which may limit detection of small or nondisplaced fractures. No acute osseous abnormality or suspicious osseous lesion. Stable appearance of an L1 compression deformity seen on comparison radiographs. Levocurvature of the lumbar spine is similar to priors. Slight rightward lateral listhesis of L2 on L3 is unchanged from comparison radiography. Multilevel discogenic and facet degenerative changes present throughout the lumbar spine. Prior left femoral ORIF. Fragmentation along the greater trochanter may be postsurgical. There is an age-indeterminate fracture of the right greater trochanter. IMPRESSION: 1. Several clustered loops of fluid-filled small bowel in the left lower quadrant as well as diffuse mild pancolonic edematous thickening with associated hazy mesenteric stranding, may represent a nonspecific enterocolitis. 2. Moderate fluid-filled hiatal hernia. Few fluid-filled airways in the right lung base could reflect aspiration. 3. Colonic diverticulosis without evidence of diverticulitis. 4. Lentiform 4.4 x 1.9 x 6 cm fluid collection in the left hip  likely related to prior left femur ORIF however sterility is not ascertained by imaging. 5. Age-indeterminate fracture of the right greater trochanter. Recommend correlation with point tenderness. 6. Stable appearance of an L1 compression deformity seen on comparison radiographs. 7. Prior cholecystectomy, hysterectomy, appendectomy. 8. Aortic Atherosclerosis (ICD10-I70.0). Electronically Signed: By: Kreg Shropshire M.D. On: 12/26/2019 03:21    Assessment/Plan 1. Enterocolitis -improved as finishes course of cipro, flagyl -monitor -limit zofran to 72 h -cont hyoscyamine for another week or so then try to stop due to anticholinergic effects in older adults   2. Chronic obstructive pulmonary disease, unspecified COPD type (HCC) -may continue oxygen if needed -cont home inhaler regimen  3. RLS (restless legs syndrome) -resume requip just 0.25mg  qhs (had been on very high dose that increases fall risk)  4. Essential hypertension -bp controlled with current regimen, monitor; if running high, may need to restart the amlodipine  5. Closed displaced intertrochanteric fracture of left femur, sequela -wean oxycodone  q12h prn for 3 days, then daily for 3 days, then stop -increase cymbalta to 60mg  daily also to help mood  6. Postoperative anemia -last hgb 8.6, recommend f/u before d/c home; not on iron which will likely worsen gi distress at this point and her baseline constipation (on linzess, colace)  7. Peripheral polyneuropathy -resume gabapentin also at just 100mg  po bid  -does have normal GFR but more petite lady with fall risk so don't want to push this too high  8. Gastroesophageal reflux disease without esophagitis -continue protonix   Family/ staff Communication: discussed with ADON  Labs/tests ordered: will need f/u cbc before d/c home--hgb should trend up  Koral Thaden L. Myangel Summons, D.O. [facility] [address], [address] [place] Cell  Phone (Mon-Fri 8am-5pm):  [phone] On Call:  [phone] & follow prompts after 5pm & weekends Office Phone:  [phone] Office Fax:  [phone]

## 2020-01-16 MED ORDER — VITAMIN D-3 125 MCG (5000 UT) PO TABS: 5000.0000 [IU] | ORAL_TABLET | Freq: Every day | ORAL | 5 refills | Status: AC

## 2020-01-19 ENCOUNTER — Non-Acute Institutional Stay (SKILLED_NURSING_FACILITY): Payer: Medicare Other | Admitting: Internal Medicine

## 2020-01-19 DIAGNOSIS — S72142S Displaced intertrochanteric fracture of left femur, sequela: Secondary | ICD-10-CM | POA: Diagnosis not present

## 2020-01-19 DIAGNOSIS — J449 Chronic obstructive pulmonary disease, unspecified: Secondary | ICD-10-CM

## 2020-01-19 DIAGNOSIS — G2581 Restless legs syndrome: Secondary | ICD-10-CM

## 2020-01-19 DIAGNOSIS — K529 Noninfective gastroenteritis and colitis, unspecified: Secondary | ICD-10-CM

## 2020-01-19 DIAGNOSIS — E871 Hypo-osmolality and hyponatremia: Secondary | ICD-10-CM | POA: Diagnosis not present

## 2020-01-19 DIAGNOSIS — F419 Anxiety disorder, unspecified: Secondary | ICD-10-CM

## 2020-01-20 ENCOUNTER — Encounter: Payer: Self-pay | Admitting: Internal Medicine

## 2020-01-20 MED ORDER — SIMETHICONE 80 MG PO CHEW: 80.0000 mg | CHEWABLE_TABLET | Freq: Three times a day (TID) | ORAL | 0 refills | Status: AC | PRN

## 2020-01-20 MED ORDER — LINACLOTIDE 145 MCG PO CAPS: 145.0000 ug | ORAL_CAPSULE | Freq: Every day | ORAL | 0 refills | Status: DC

## 2020-01-20 MED ORDER — GABAPENTIN 100 MG PO CAPS: 100.0000 mg | ORAL_CAPSULE | Freq: Two times a day (BID) | ORAL | 0 refills | Status: DC

## 2020-01-20 MED ORDER — LORAZEPAM 0.5 MG PO TABS: 0.2500 mg | ORAL_TABLET | Freq: Two times a day (BID) | ORAL | 0 refills | Status: AC | PRN

## 2020-01-20 MED ORDER — DULOXETINE HCL 30 MG PO CPEP: 60.0000 mg | ORAL_CAPSULE | Freq: Every day | ORAL | 0 refills | Status: AC

## 2020-01-20 MED ORDER — PANTOPRAZOLE SODIUM 40 MG PO TBEC: 40.0000 mg | DELAYED_RELEASE_TABLET | Freq: Every day | ORAL | 0 refills | Status: AC

## 2020-01-20 MED ORDER — BUPROPION HCL ER (XL) 300 MG PO TB24: 300.0000 mg | ORAL_TABLET | Freq: Every day | ORAL | 0 refills | Status: AC

## 2020-01-20 MED ORDER — HYOSCYAMINE SULFATE 0.125 MG SL SUBL: 0.1250 mg | SUBLINGUAL_TABLET | Freq: Three times a day (TID) | SUBLINGUAL | 0 refills | Status: DC | PRN

## 2020-01-20 MED ORDER — BREZTRI AEROSPHERE 160-9-4.8 MCG/ACT IN AERO: 1.0000 | INHALATION_SPRAY | Freq: Every day | RESPIRATORY_TRACT | 0 refills | Status: AC

## 2020-01-20 MED ORDER — ALBUTEROL SULFATE (2.5 MG/3ML) 0.083% IN NEBU: 2.5000 mg | INHALATION_SOLUTION | Freq: Three times a day (TID) | RESPIRATORY_TRACT | 0 refills | Status: AC | PRN

## 2020-01-20 MED ORDER — ROPINIROLE HCL 0.25 MG PO TABS: 0.2500 mg | ORAL_TABLET | Freq: Every day | ORAL | 0 refills | Status: AC

## 2020-01-20 NOTE — Progress Notes (Signed)
Location:    [name] Room Number: 422/W Place of Service:  SNF [phone])  Provider: Estill Batten  PCP: [name], MD Patient Care Team: [name], MD as PCP - General (Internal Medicine) Kermit Balo, DO as Consulting Physician (Geriatric Medicine) Rehab, [facility]  Extended Emergency Contact Information Primary Emergency Contact: Wirtz,Jill Address: [address]          [place], [place] [address] [place] Mobile Phone: [phone] Relation: Relative Preferred language: English Interpreter needed? No Secondary Emergency Contact: Wirtz,Mark Mobile Phone: [phone] Relation: Son  Code Status: Full Code Goals of care:  Advanced Directive information Advanced Directives 01/20/2020  Does Patient Have a Medical Advance Directive? Yes  Type of Advance Directive (No Data)  Does patient want to make changes to medical advance directive? No - Patient declined  Would patient like information on creating a medical advance directive? -     Allergies  Allergen Reactions  . Amoxicillin Nausea Only  . Cephalosporins Nausea Only  . Clindamycin/Lincomycin Nausea Only  . Doxycycline Nausea Only  . Penicillins Diarrhea    Patient states she is allergic to 'all antibiotics' states has diarrhea and nausea.    Chief Complaint  Patient presents with  . Discharge Note    Discharge Visit    HPI:  74 y.o. female seen today for discharge from facility scheduled for January 20, 2020.  She has been here for rehab after hospitalization for enterocolitis with significant debility.  She also has a history of left femoral fracture recently for which she originally was here the first time she was here for rehab.  Her other medical issues include COPD on chronic oxygen-restless legs-hypertension-as well as history of anemia-peripheral neuropathy-GERD depression constipation and anxiety she  also has had some hyponatremia in the past.  Clinically she appears to be stable does not really have any acute complaints today.  She continues to complain of some abdominal discomfort although this has improved since her hospitalization.  She continues on hyoscyamine which she can receive every 8 hours for abdominal cramping-there is recommendation to try to wean this off gradually-however she apparently is still having some cramping.  In the hospital she did receive IV fluids as well as Cipro and Flagyl.  Her other medical issues appear to be stable she does have a history of COPD and continues on chronic oxygen and inhalers  She does have a history of restless legs her Requip was held in the hospital because of fatigue but has been restarted at night 0.25 mg and appears to be well-tolerated.  Blood pressure also appears to be stable-her Norvasc was discontinued in the hospital because of soft blood pressures.  In regards to her left femur fracture-she has been weaned off oxycodone her Cymbalta has been increased to 60 mg a day this appears to be helping.  She also has a history of postop anemia this appears to be improving her hemoglobin is up to 11.8 on lab done today had been 8.6 on hospital discharge.  In regards to neuropathy she continues on Neurontin 100 mg twice a day.  She also has a history of depression with anxiety and continues on Wellbutrin 300 mg a day she has orders for Klonopin 0.25 mg twice daily as needed-she says she still needs this occasionally but would prefer lorazepam we will give her a limited supply upon discharge-  Currently she is resting in bed comfortably still  at times complains some of abdominal cramping but apparently this has improved.  She is looking forward to going home she will will be with her daughter-in-law she will need continued PT and OT.       Past Medical History:  Diagnosis Date  . Anemia   . At risk for delirium   . Closed  fracture of anterior column of left acetabulum (HCC)   . Closed fracture of pelvis (HCC)   . COPD (chronic obstructive pulmonary disease) (HCC)   . Depression   . History of rheumatic fever   . HTN (hypertension)   . Hyponatremia   . Lupus (HCC)   . RLS (restless legs syndrome)     Past Surgical History:  Procedure Laterality Date  . ABDOMINAL HYSTERECTOMY    . APPENDECTOMY    . 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 CLOSED MANIPULATION AND CLOSED TREATMENT OF LEFT ACETABULUM FRACTURE, BOTH COLUMNS Left   . HIP SURGERY Left   . REMOVAL OF DEEP HARDWARE, LEFT INTRAMEDULLARY NAIL Left    TFN  . 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 TONSILLECTOMY        reports that she has quit smoking. She has never used smokeless tobacco. She reports that she does not drink alcohol and does not use drugs. Social History   Socioeconomic History  . Marital status: Widowed    Spouse name: Not on file  . Number of children: Not on file  . Years of education: Not on file  . Highest education level: Not on file  Occupational History  . Not on file  Tobacco Use  . Smoking status: Former Games developer  . Smokeless tobacco: Never Used  Vaping Use  . Vaping Use: Never used  Substance and Sexual Activity  . Alcohol use: Never  . Drug use: Never  . Sexual activity: Not on file  Other Topics Concern  . Not on file  Social History Narrative  . Not on file   Social Determinants of Health   Financial Resource Strain:   . Difficulty of Paying Living Expenses:   Food Insecurity:   . Worried About Programme researcher, broadcasting/film/video in the Last Year:   . Barista in the Last Year:   Transportation Needs:   . Freight forwarder (Medical):   Marland Kitchen Lack of Transportation (Non-Medical):   Physical Activity:   . Days of Exercise per Week:   . Minutes of Exercise per Session:   Stress:   . Feeling of Stress :   Social Connections:   .  Frequency of Communication with Friends and Family:   . Frequency of Social Gatherings with Friends and Family:   . Attends Religious Services:   . Active Member of Clubs or Organizations:   . Attends Banker Meetings:   Marland Kitchen Marital Status:   Intimate Partner Violence:   . Fear of Current or Ex-Partner:   . Emotionally Abused:   Marland Kitchen Physically Abused:   . Sexually Abused:    Functional Status Survey:    Allergies  Allergen Reactions  . Amoxicillin Nausea Only  . Cephalosporins Nausea Only  . Clindamycin/Lincomycin Nausea Only  . Doxycycline Nausea Only  . Penicillins Diarrhea    Patient states she is allergic to 'all antibiotics' states has diarrhea and nausea.    Pertinent  Health Maintenance Due  Topic Date Due  . COLONOSCOPY  Never done  . DEXA SCAN  Never done  . PNA vac Low Risk Adult (1 of 2 - PCV13) Never done  . INFLUENZA VACCINE  02/29/2020    Medications: Allergies as of 01/19/2020      Reactions   Amoxicillin Nausea Only   Cephalosporins Nausea Only   Clindamycin/lincomycin Nausea Only   Doxycycline Nausea Only   Penicillins Diarrhea   Patient states she is allergic to 'all antibiotics' states has diarrhea and nausea.      Medication List       Accurate as of January 19, 2020 11:59 PM. If you have any questions, ask your nurse or doctor.        STOP taking these medications   clonazePAM 0.5 MG tablet Commonly known as: KLONOPIN   ondansetron 4 MG tablet Commonly known as: ZOFRAN   oxyCODONE 5 MG immediate release tablet Commonly known as: Oxy IR/ROXICODONE   sucralfate 1 GM/10ML suspension Commonly known as: CARAFATE     TAKE these medications   acetaminophen 500 MG tablet Commonly known as: TYLENOL Take 2 tablets (1,000 mg total) by mouth 3 (three) times daily.   albuterol (2.5 MG/3ML) 0.083% nebulizer solution Commonly known as: PROVENTIL Take 3 mLs (2.5 mg total) by nebulization every 8 (eight) hours as needed for wheezing or  shortness of breath.   alum & mag hydroxide-simeth 200-200-20 MG/5ML suspension Commonly known as: MAALOX/MYLANTA Take 30 mLs by mouth every 4 (four) hours as needed for indigestion.   Artificial Tears 1 % ophthalmic solution Generic drug: carboxymethylcellulose Place 2 drops into both eyes 3 (three) times daily. Instill 2 drops both eyes three times a day as needed for dry eyes   aspirin EC 81 MG tablet Take 81 mg by mouth 2 (two) times daily. QAM and QHS   Breztri Aerosphere 160-9-4.8 MCG/ACT Aero Generic drug: Budeson-Glycopyrrol-Formoterol Inhale 1 puff into the lungs daily.   buPROPion 300 MG 24 hr tablet Commonly known as: WELLBUTRIN XL Take 1 tablet (300 mg total) by mouth daily.   calcium carbonate 500 MG chewable tablet Commonly known as: TUMS - dosed in mg elemental calcium Chew 1 tablet by mouth 3 (three) times daily with meals.   docusate sodium 100 MG capsule Commonly known as: COLACE Take 100 mg by mouth daily.   DULoxetine 30 MG capsule Commonly known as: CYMBALTA Take 60 mg by mouth daily.   feeding supplement (ENSURE ENLIVE) Liqd Take 237 mLs by mouth 2 (two) times daily between meals.   gabapentin 100 MG capsule Commonly known as: NEURONTIN Take 100 mg by mouth 2 (two) times daily.   guaiFENesin 600 MG 12 hr tablet Commonly known as: MUCINEX Take 2 tablets (1,200 mg total) by mouth 2 (two) times daily as needed for cough or to loosen phlegm.   hyoscyamine 0.125 MG SL tablet Commonly known as: LEVSIN SL Take 1 tablet (0.125 mg total) by mouth every 8 (eight) hours as needed for cramping.   linaclotide 145 MCG Caps capsule Commonly known as: LINZESS Take 1 capsule (145 mcg total) by mouth daily before breakfast.   LORazepam 0.5 MG tablet Commonly known as: ATIVAN Take 0.25 mg by mouth 2 (two) times daily as needed for anxiety.   magnesium hydroxide 400 MG/5ML suspension Commonly known as: MILK OF MAGNESIA Take 30 mLs by mouth daily as needed  for moderate constipation.   pantoprazole 40 MG tablet Commonly known as: PROTONIX Take 1 tablet (40 mg total) by mouth daily.   psyllium 58.6 %  packet Commonly known as: METAMUCIL Take 1 packet by mouth at bedtime.   rOPINIRole 0.25 MG tablet Commonly known as: REQUIP Take 0.25 mg by mouth at bedtime.   simethicone 80 MG chewable tablet Commonly known as: MYLICON Chew 1 tablet (80 mg total) by mouth 3 (three) times daily as needed.   Vitamin D-3 125 MCG (5000 UT) Tabs Take 5,000 Units by mouth daily.       Review of Systems   General she not complaining of fever chills says she still feels somewhat weak but apparently has improved.  Skin is not complain of rashes itching or diaphoresis.  Head ears eyes nose mouth and throat does not complain of visual changes or sore throat.  Respiratory does not complain at this time of being short of breath she does have somewhat of a chronic cough especially with deep inspiration but this is not new-she is on oxygen.  Cardiac does not complain of chest pain or increasing edema.  GI--does not complain of nausea vomiting or diarrhea continues to complain of occasional abdominal soreness cramping.  GU does not complain of dysuria.  Musculoskeletal continues with weakness-does not complain of acute joint pain however.  Neurologic positive for weakness does not complain of dizziness headache or numbness.  Psych does have some history of depression with anxiety but does not complain of acute episodes at this time.  Appears to be in good spirits looking forward to going home.      Vitals:   01/20/20 0728  BP: (!) 142/85  Pulse: 95  Resp: 18  Temp: (!) 97 F (36.1 C)  TempSrc: Oral  Weight: 118 lb 9.6 oz (53.8 kg)  Height: 5' (1.524 m)  Recent systolics have been in the 120-140 's  area Body mass index is 23.16 kg/m. Physical Exam In general this is a pleasant elderly female in no distress resting comfortably in  bed.  Her skin is warm and dry.  Eyes visual acuity appears grossly intact sclera and conjunctive are clear.  Oropharynx is clear mucous membranes moist.  Chest is clear to auscultation-however when she takes a deep breath she does cough but this is chronic.  No signs of labored breathing.  Heart is regular rate and rhythm without murmur gallop or rub she does not appear to have significant lower extremity edema.  Abdomen is soft mildly tender to palpation diffuse-she says this is baseline she describes this more as a soreness rather than acute tenderness.  Bowel sounds are positive.  Musculoskeletal Limited exam since she is in bed but is able to move all extremities x4  with some lower extremity weakness.  Neurologic is grossly intact cannot really appreciate lateralizing findings.  Psych she is alert and oriented pleasant and appropriate.       Labs reviewed:  January 19, 2020.  WBC 6.8 hemoglobin 11.8 platelets 311.  Sodium 135 potassium 3.9 BUN 11.9 creatinine 0.58.  Liver function tests within normal limits   Basic Metabolic Panel: Recent Labs    12/26/19 0918 12/27/19 0724 01/01/20 0657 01/02/20 0500 01/03/20 0656  NA  --    < > 138 137 136  K  --    < > 4.6 5.1 4.2  CL  --    < > 98 99 98  CO2  --    < > 34* 31 32  GLUCOSE  --    < > 112* 136* 106*  BUN  --    < > <5* 9 15  CREATININE  --    < > 0.55 0.78 0.63  CALCIUM 8.5*   < > 8.8* 8.9 8.5*  MG 1.6*  --  1.5* 1.9  --   PHOS 3.3  --   --   --   --    < > = values in this interval not displayed.   Liver Function Tests: Recent Labs    12/26/19 0110 01/01/20 0657 01/02/20 0500  AST 13* 14* 17  ALT 11 10 12   ALKPHOS 74 60 63  BILITOT 0.7 0.5 0.6  PROT 6.0* 4.6* 5.3*  ALBUMIN 3.4* 2.7* 3.0*   Recent Labs    12/26/19 0110  LIPASE 22   No results for input(s): AMMONIA in the last 8760 hours. CBC: Recent Labs    10/27/19 1123 10/27/19 1123 10/28/19 0444 10/29/19 0440 12/26/19 0110  12/27/19 0724 01/01/20 0657 01/02/20 0500 01/03/20 0656  WBC 7.7   < > 6.4   < > 5.2   < > 4.4 9.4 4.5  NEUTROABS 6.0  --  4.8  --  4.0  --   --   --   --   HGB 9.4*   < > 8.8*   < > 10.4*   < > 9.7* 10.7* 8.6*  HCT 32.3*   < > 29.9*   < > 35.3*   < > 31.5* 35.8* 29.0*  MCV 93.1   < > 94.0   < > 91.0   < > 91.3 92.5 92.4  PLT 249   < > 227   < > 256   < > 202 195 180   < > = values in this interval not displayed.   Cardiac Enzymes: No results for input(s): CKTOTAL, CKMB, CKMBINDEX, TROPONINI in the last 8760 hours. BNP: Invalid input(s): POCBNP CBG: Recent Labs    01/01/20 0740 01/02/20 0831 01/02/20 2213  GLUCAP 104* 116* 138*    Procedures and Imaging Studies During Stay: CT ABDOMEN PELVIS W CONTRAST  Result Date: 12/31/2019 CLINICAL DATA:  74 year old female with concern for abdominal infection. EXAM: CT ABDOMEN AND PELVIS WITH CONTRAST TECHNIQUE: Multidetector CT imaging of the abdomen and pelvis was performed using the standard protocol following bolus administration of intravenous contrast. CONTRAST:  100mL OMNIPAQUE IOHEXOL 300 MG/ML  SOLN COMPARISON:  CT abdomen pelvis dated 12/26/2019. FINDINGS: Lower chest: Trace bilateral pleural effusions. There is calcification of the mitral annulus. No intra-abdominal free air. No free fluid. Hepatobiliary: The liver is unremarkable. There is mild intrahepatic biliary ductal dilatation. Cholecystectomy. No retained calcified stone noted in the central CBD. Pancreas: The pancreas is suboptimally visualized but appears unremarkable. Spleen: Normal in size without focal abnormality. Adrenals/Urinary Tract: The adrenal glands are unremarkable. Bilateral renal cortical irregularity. There is a 4 mm nonobstructing left renal interpolar stone. There is no hydronephrosis on either side. There is symmetric enhancement and excretion of contrast by both kidneys. The visualized ureters and urinary bladder appear unremarkable. Stomach/Bowel: There is a  moderate size hiatal hernia. There is mild thickened appearance of the distal stomach with probable mild inflammation. Although this may be partly related to underdistention findings concerning for gastritis. Clinical correlation is recommended. A 2.5 cm duodenal diverticulum is noted. There is sigmoid diverticulosis without active inflammatory changes. There is no bowel obstruction. Vascular/Lymphatic: Moderate aortoiliac atherosclerotic disease. The IVC is unremarkable. No portal venous gas. There is no adenopathy. Reproductive: Hysterectomy. Other: Mild diffuse subcutaneous edema. Musculoskeletal: Osteopenia with degenerative changes of the spine. Old left femoral neck fracture status post  internal fixation. Old fracture of the greater trochanter of the right femur with incomplete union. Old left pubic bone fractures noted. No acute fracture. IMPRESSION: 1. Findings concerning for gastritis. Clinical correlation is recommended. 2. Moderate size hiatal hernia. 3. Sigmoid diverticulosis. No bowel obstruction. 4. A 4 mm nonobstructing left renal interpolar stone. No hydronephrosis. 5. Aortic Atherosclerosis (ICD10-I70.0). Electronically Signed   By: Elgie Collard M.D.   On: 12/31/2019 17:38   CT ABDOMEN PELVIS W CONTRAST  Addendum Date: 12/26/2019   ADDENDUM REPORT: 12/26/2019 04:35 ADDENDUM: 1 cm left adrenal nodule. In the absence of known cancer history, finding is likely benign such as an adrenal adenoma. Could consider 12 month follow-up adrenal CT to assess for stability. This recommendation follows ACR consensus guidelines: Management of Incidental Adrenal Masses: A White Paper of the ACR Incidental Findings Committee. J Am Coll Radiol 2017;14:1038-1044. Electronically Signed   By: Kreg Shropshire M.D.   On: 12/26/2019 04:35   Result Date: 12/26/2019 CLINICAL DATA:  Right abdominal pain with nausea EXAM: CT ABDOMEN AND PELVIS WITH CONTRAST TECHNIQUE: Multidetector CT imaging of the abdomen and pelvis  was performed using the standard protocol following bolus administration of intravenous contrast. CONTRAST:  OMNIPAQUE IOHEXOL 300 MG/ML  SOLN COMPARISON:  Right hip radiographs 12/16/2019 FINDINGS: Lower chest: Few fluid-filled airways are seen in the right lung base. Mild atelectatic changes. Lung bases otherwise clear without pleural effusion. Normal cardiac size. Likely calcification upon the mitral annulus. No pericardial effusion. Hepatobiliary: Focal fatty infiltration seen along the falciform ligament. Otherwise normal a Paddock attenuation concerning focal liver lesion. Smooth liver surface contour. Patient is post cholecystectomy. Slight prominence of the biliary tree likely related to reservoir effect. No calcified intraductal gallstones. Pancreas: Diffuse moderate atrophy of the pancreas without ductal dilatation or peripancreatic inflammation. Spleen: Normal in size without focal abnormality. Adrenals/Urinary Tract: 1 cm nodule in the body of the left adrenal gland, indeterminate (2/13). No right adrenal lesion. Kidneys enhance and excrete symmetrically. Scattered subcentimeter hypoattenuating foci in both kidneys too small to fully characterize on CT imaging but statistically likely benign. No worrisome renal mass. No obstructive urolithiasis or hydronephrosis. There is mild bladder wall thickening with some faint perivesicular stranding. Stomach/Bowel: Fluid-filled moderate hiatal hernia. Distal stomach and duodenum are unremarkable. Several clustered loops of fluid-filled small bowel seen in the left lower quadrant with associated hazy mesenteric stranding. No evidence of obstruction. Appendix is not visualized. The appendix is surgically absent. High attenuation material throughout the proximal colon may reflect recent ingestion of bismuth containing products. Some mild diffuse pancolonic edematous thickening is noted as well though may be accentuated by underdistention. Scattered colonic  diverticula without focal pericolonic inflammation to suggest diverticulitis. Vascular/Lymphatic: Atherosclerotic calcifications throughout the abdominal aorta and branch vessels. Mild aortic tortuosity. No aneurysm or ectasia. No enlarged abdominopelvic lymph nodes. Reproductive: Uterus is surgically absent. No concerning adnexal lesions. Other: Lentiform 4.4 x 1.9 x 6 cm fluid collection in the left hip likely related to prior left femur ORIF. Mid mesenteric hazy stranding, as above. No abdominopelvic free fluid or air. No bowel containing hernias. Musculoskeletal: The osseous structures appear diffusely demineralized which may limit detection of small or nondisplaced fractures. No acute osseous abnormality or suspicious osseous lesion. Stable appearance of an L1 compression deformity seen on comparison radiographs. Levocurvature of the lumbar spine is similar to priors. Slight rightward lateral listhesis of L2 on L3 is unchanged from comparison radiography. Multilevel discogenic and facet degenerative changes present throughout the lumbar spine. Prior  left femoral ORIF. Fragmentation along the greater trochanter may be postsurgical. There is an age-indeterminate fracture of the right greater trochanter. IMPRESSION: 1. Several clustered loops of fluid-filled small bowel in the left lower quadrant as well as diffuse mild pancolonic edematous thickening with associated hazy mesenteric stranding, may represent a nonspecific enterocolitis. 2. Moderate fluid-filled hiatal hernia. Few fluid-filled airways in the right lung base could reflect aspiration. 3. Colonic diverticulosis without evidence of diverticulitis. 4. Lentiform 4.4 x 1.9 x 6 cm fluid collection in the left hip likely related to prior left femur ORIF however sterility is not ascertained by imaging. 5. Age-indeterminate fracture of the right greater trochanter. Recommend correlation with point tenderness. 6. Stable appearance of an L1 compression deformity  seen on comparison radiographs. 7. Prior cholecystectomy, hysterectomy, appendectomy. 8. Aortic Atherosclerosis (ICD10-I70.0). Electronically Signed: By: Kreg Shropshire M.D. On: 12/26/2019 03:21    Assessment/Plan:    #1-history of enterocolitis-she did receive IV fluids as well as Cipro and Flagyl in the hospital apparently this has improved still complains of some occasional abdominal cramping continues on Hyoscyamine 0.125 mg every 8 hours as needed-again eventually there are hopes she may be weaned off of this-will defer to primary care provider for follow-up.  2.  History of COPD this is been stable during her stay here she is on chronic oxygen does have albuterol every 8 hours as needed as well as  Catering manager daily-she also has orders for guaifenesin 1200 mg twice daily  #3 history of restless legs her Requip has been started she is on 0.25 mg nightly she appears to be tolerating this well.  4.  Hypertension as noted above does not really appear to have consistent elevations systolically over 140 from the data I was able to see-her Norvasc was DC'd in hospital because of soft blood pressures.  5.  History of left femoral fracture-she has been weaned off her oxycodone-Cymbalta has been increased to 60 mg a day this appears to be stable will need continued PT and OT.  6.  Postop anemia this appears to have significantly improved with a hemoglobin of 11.8 on today's lab it was 8.6 on hospital discharge-.  7.  History of peripheral neuropathy in addition to Cymbalta she continues on Neurontin 100 mg twice daily.  8.  GERD she continues on Protonix this appears to be stable again does complain of some abdominal cramping status post enterocolitis.  9.  History of depression she is on Wellbutrin extended release 300 mg a day as well as Cymbalta 60 mg a day-at this point appears stable she appears to be in good spirits today nursing has not really reported any acute issues in this  regard.  10.  Anxiety she has had as needed clonazepam-says she still needs something to help with her anxiety at times-we will discharge her on a limited amount of lorazepam-she prefers lorazepam-we will do 0.25 mg twice daily as needed and will prescribe 20 tablets with follow-up by primary care provider.  11.  History of constipation apparently this is been stable she is on numerous agents including Colace 100 mg a day-Linzess 145 mg a day and Metamucil.  12.  History of gastroparesis she is no longer on Carafate apparently this has stabilized however.  13.  History of chronic hyponatremia this appears to have stabilized her sodium was 135 on the lab done today-this will warrant follow-up by primary care provider.  Again she will be going home with her daughter-in-law she is looking  forward to this she will need PT OT as well as expedient follow-up by primary care provider.  We have dispensed a limited supply of Ativan as noted above.  [id number] note greater than 30 minutes spent on this discharge summary-greater than 50% of time spent coordinating formulating a plan of care for numerous diagnoses

## 2020-02-04 ENCOUNTER — Encounter (HOSPITAL_COMMUNITY): Payer: Self-pay

## 2020-02-04 ENCOUNTER — Emergency Department (HOSPITAL_COMMUNITY): Payer: Medicare Other

## 2020-02-04 ENCOUNTER — Other Ambulatory Visit: Payer: Self-pay

## 2020-02-04 ENCOUNTER — Inpatient Hospital Stay (HOSPITAL_COMMUNITY)
Admission: EM | Admit: 2020-02-04 | Discharge: 2020-02-07 | DRG: 391 | Disposition: A | Discharge status: Home / Self Care | Payer: Medicare Other | Attending: Internal Medicine | Admitting: Internal Medicine

## 2020-02-04 DIAGNOSIS — K449 Diaphragmatic hernia without obstruction or gangrene: Principal | ICD-10-CM | POA: Diagnosis present

## 2020-02-04 DIAGNOSIS — S32432A Displaced fracture of anterior column [iliopubic] of left acetabulum, initial encounter for closed fracture: Secondary | ICD-10-CM | POA: Diagnosis present

## 2020-02-04 DIAGNOSIS — Z87891 Personal history of nicotine dependence: Secondary | ICD-10-CM

## 2020-02-04 DIAGNOSIS — Z9981 Dependence on supplemental oxygen: Secondary | ICD-10-CM

## 2020-02-04 DIAGNOSIS — M810 Age-related osteoporosis without current pathological fracture: Secondary | ICD-10-CM | POA: Diagnosis present

## 2020-02-04 DIAGNOSIS — Z888 Allergy status to other drugs, medicaments and biological substances status: Secondary | ICD-10-CM

## 2020-02-04 DIAGNOSIS — D649 Anemia, unspecified: Secondary | ICD-10-CM | POA: Diagnosis present

## 2020-02-04 DIAGNOSIS — Z20822 Contact with and (suspected) exposure to covid-19: Secondary | ICD-10-CM | POA: Diagnosis present

## 2020-02-04 DIAGNOSIS — N12 Tubulo-interstitial nephritis, not specified as acute or chronic: Secondary | ICD-10-CM | POA: Diagnosis present

## 2020-02-04 DIAGNOSIS — Z8249 Family history of ischemic heart disease and other diseases of the circulatory system: Secondary | ICD-10-CM

## 2020-02-04 DIAGNOSIS — J9611 Chronic respiratory failure with hypoxia: Secondary | ICD-10-CM | POA: Diagnosis present

## 2020-02-04 DIAGNOSIS — R1084 Generalized abdominal pain: Secondary | ICD-10-CM

## 2020-02-04 DIAGNOSIS — K529 Noninfective gastroenteritis and colitis, unspecified: Secondary | ICD-10-CM | POA: Diagnosis present

## 2020-02-04 DIAGNOSIS — I10 Essential (primary) hypertension: Secondary | ICD-10-CM | POA: Diagnosis present

## 2020-02-04 DIAGNOSIS — Z880 Allergy status to penicillin: Secondary | ICD-10-CM

## 2020-02-04 DIAGNOSIS — S72142A Displaced intertrochanteric fracture of left femur, initial encounter for closed fracture: Secondary | ICD-10-CM | POA: Diagnosis present

## 2020-02-04 DIAGNOSIS — Z79899 Other long term (current) drug therapy: Secondary | ICD-10-CM

## 2020-02-04 DIAGNOSIS — F419 Anxiety disorder, unspecified: Secondary | ICD-10-CM | POA: Diagnosis present

## 2020-02-04 DIAGNOSIS — G2581 Restless legs syndrome: Secondary | ICD-10-CM | POA: Diagnosis present

## 2020-02-04 DIAGNOSIS — R443 Hallucinations, unspecified: Secondary | ICD-10-CM | POA: Diagnosis present

## 2020-02-04 DIAGNOSIS — F329 Major depressive disorder, single episode, unspecified: Secondary | ICD-10-CM | POA: Diagnosis present

## 2020-02-04 DIAGNOSIS — K5900 Constipation, unspecified: Secondary | ICD-10-CM | POA: Diagnosis present

## 2020-02-04 DIAGNOSIS — R109 Unspecified abdominal pain: Secondary | ICD-10-CM | POA: Diagnosis present

## 2020-02-04 DIAGNOSIS — Z881 Allergy status to other antibiotic agents status: Secondary | ICD-10-CM

## 2020-02-04 DIAGNOSIS — J449 Chronic obstructive pulmonary disease, unspecified: Secondary | ICD-10-CM | POA: Diagnosis present

## 2020-02-04 DIAGNOSIS — Z7982 Long term (current) use of aspirin: Secondary | ICD-10-CM

## 2020-02-04 DIAGNOSIS — Z90710 Acquired absence of both cervix and uterus: Secondary | ICD-10-CM

## 2020-02-04 LAB — CBC WITH DIFFERENTIAL/PLATELET
Abs Immature Granulocytes: 0.03 10*3/uL (ref 0.00–0.07)
Basophils Absolute: 0 10*3/uL (ref 0.0–0.1)
Basophils Relative: 0 %
Eosinophils Absolute: 0.2 10*3/uL (ref 0.0–0.5)
Eosinophils Relative: 3 %
HCT: 37.6 % (ref 36.0–46.0)
Hemoglobin: 11.6 g/dL — ABNORMAL LOW (ref 12.0–15.0)
Immature Granulocytes: 0 %
Lymphocytes Relative: 13 %
Lymphs Abs: 0.9 10*3/uL (ref 0.7–4.0)
MCH: 29.5 pg (ref 26.0–34.0)
MCHC: 30.9 g/dL (ref 30.0–36.0)
MCV: 95.7 fL (ref 80.0–100.0)
Monocytes Absolute: 0.6 10*3/uL (ref 0.1–1.0)
Monocytes Relative: 9 %
Neutro Abs: 5 10*3/uL (ref 1.7–7.7)
Neutrophils Relative %: 75 %
Platelets: 276 10*3/uL (ref 150–400)
RBC: 3.93 MIL/uL (ref 3.87–5.11)
RDW: 14.7 % (ref 11.5–15.5)
WBC: 6.7 10*3/uL (ref 4.0–10.5)
nRBC: 0 % (ref 0.0–0.2)

## 2020-02-04 LAB — URINALYSIS, ROUTINE W REFLEX MICROSCOPIC
Bilirubin Urine: NEGATIVE
Glucose, UA: NEGATIVE mg/dL
Hgb urine dipstick: NEGATIVE
Ketones, ur: NEGATIVE mg/dL
Leukocytes,Ua: NEGATIVE
Nitrite: NEGATIVE
Protein, ur: NEGATIVE mg/dL
Specific Gravity, Urine: 1.039 — ABNORMAL HIGH (ref 1.005–1.030)
pH: 8 (ref 5.0–8.0)

## 2020-02-04 LAB — COMPREHENSIVE METABOLIC PANEL WITH GFR
ALT: 11 U/L (ref 0–44)
AST: 13 U/L — ABNORMAL LOW (ref 15–41)
Albumin: 3.8 g/dL (ref 3.5–5.0)
Alkaline Phosphatase: 66 U/L (ref 38–126)
Anion gap: 9 (ref 5–15)
BUN: 15 mg/dL (ref 8–23)
CO2: 36 mmol/L — ABNORMAL HIGH (ref 22–32)
Calcium: 9.3 mg/dL (ref 8.9–10.3)
Chloride: 91 mmol/L — ABNORMAL LOW (ref 98–111)
Creatinine, Ser: 0.72 mg/dL (ref 0.44–1.00)
GFR calc Af Amer: >60 mL/min
GFR calc non Af Amer: >60 mL/min
Glucose, Bld: 99 mg/dL (ref 70–99)
Potassium: 3.8 mmol/L (ref 3.5–5.1)
Sodium: 136 mmol/L (ref 135–145)
Total Bilirubin: 0.7 mg/dL (ref 0.3–1.2)
Total Protein: 6.5 g/dL (ref 6.5–8.1)

## 2020-02-04 LAB — SARS CORONAVIRUS 2 BY RT PCR (HOSPITAL ORDER, PERFORMED IN ~~LOC~~ HOSPITAL LAB): SARS Coronavirus 2: NEGATIVE

## 2020-02-04 LAB — LIPASE, BLOOD: Lipase: 23 U/L (ref 11–51)

## 2020-02-04 MED ORDER — ONDANSETRON HCL 4 MG PO TABS
4.0000 mg | ORAL_TABLET | Freq: Four times a day (QID) | ORAL | Status: DC | PRN
  Administered 2020-02-06: 4 mg via ORAL
  Filled 2020-02-04: qty 1

## 2020-02-04 MED ORDER — ONDANSETRON HCL 4 MG/2ML IJ SOLN
4.0000 mg | Freq: Four times a day (QID) | INTRAMUSCULAR | Status: DC | PRN
  Administered 2020-02-05 – 2020-02-06 (×2): 4 mg via INTRAVENOUS
  Filled 2020-02-04 (×2): qty 2

## 2020-02-04 MED ORDER — HYOSCYAMINE SULFATE 0.125 MG SL SUBL: 0.1250 mg | SUBLINGUAL_TABLET | Freq: Three times a day (TID) | SUBLINGUAL | Status: DC | PRN

## 2020-02-04 MED ORDER — TRAZODONE HCL 50 MG PO TABS
50.0000 mg | ORAL_TABLET | Freq: Every evening | ORAL | Status: DC | PRN
  Administered 2020-02-07: 50 mg via ORAL
  Filled 2020-02-04: qty 1

## 2020-02-04 MED ORDER — ACETAMINOPHEN 325 MG PO TABS: 650.0000 mg | ORAL_TABLET | Freq: Four times a day (QID) | ORAL | Status: DC | PRN

## 2020-02-04 MED ORDER — FENTANYL CITRATE (PF) 100 MCG/2ML IJ SOLN
12.5000 ug | INTRAMUSCULAR | Status: DC | PRN
  Administered 2020-02-04: 25 ug via INTRAVENOUS
  Administered 2020-02-04: 12.5 ug via INTRAVENOUS
  Administered 2020-02-05 (×2): 25 ug via INTRAVENOUS
  Administered 2020-02-05 (×2): 12.5 ug via INTRAVENOUS
  Administered 2020-02-05 – 2020-02-06 (×4): 25 ug via INTRAVENOUS
  Administered 2020-02-06: 12.5 ug via INTRAVENOUS
  Filled 2020-02-04 (×11): qty 2

## 2020-02-04 MED ORDER — OXYCODONE HCL 5 MG PO TABS
5.0000 mg | ORAL_TABLET | ORAL | Status: DC | PRN
  Administered 2020-02-05 – 2020-02-07 (×6): 5 mg via ORAL
  Filled 2020-02-04 (×6): qty 1

## 2020-02-04 MED ORDER — SODIUM CHLORIDE 0.9 % IV BOLUS
500.0000 mL | Freq: Once | INTRAVENOUS | Status: AC
  Administered 2020-02-04: 500 mL via INTRAVENOUS

## 2020-02-04 MED ORDER — UMECLIDINIUM BROMIDE 62.5 MCG/INH IN AEPB
1.0000 | INHALATION_SPRAY | Freq: Every day | RESPIRATORY_TRACT | Status: DC
  Administered 2020-02-05 – 2020-02-07 (×3): 1 via RESPIRATORY_TRACT
  Filled 2020-02-04: qty 7

## 2020-02-04 MED ORDER — BUDESON-GLYCOPYRROL-FORMOTEROL 160-9-4.8 MCG/ACT IN AERO: 1.0000 | INHALATION_SPRAY | Freq: Every day | RESPIRATORY_TRACT | Status: DC

## 2020-02-04 MED ORDER — IOHEXOL 300 MG/ML  SOLN
80.0000 mL | Freq: Once | INTRAMUSCULAR | Status: AC | PRN
  Administered 2020-02-04: 100 mL via INTRAVENOUS

## 2020-02-04 MED ORDER — GUAIFENESIN ER 600 MG PO TB12: 1200.0000 mg | ORAL_TABLET | Freq: Two times a day (BID) | ORAL | Status: DC | PRN

## 2020-02-04 MED ORDER — PANTOPRAZOLE SODIUM 40 MG PO TBEC
40.0000 mg | DELAYED_RELEASE_TABLET | Freq: Every day | ORAL | Status: DC
  Administered 2020-02-04 – 2020-02-07 (×4): 40 mg via ORAL
  Filled 2020-02-04 (×4): qty 1

## 2020-02-04 MED ORDER — HEPARIN SODIUM (PORCINE) 5000 UNIT/ML IJ SOLN
5000.0000 [IU] | Freq: Three times a day (TID) | INTRAMUSCULAR | Status: DC
  Administered 2020-02-04 – 2020-02-07 (×9): 5000 [IU] via SUBCUTANEOUS
  Filled 2020-02-04 (×9): qty 1

## 2020-02-04 MED ORDER — FLUTICASONE FUROATE-VILANTEROL 200-25 MCG/INH IN AEPB
1.0000 | INHALATION_SPRAY | Freq: Every day | RESPIRATORY_TRACT | Status: DC
  Administered 2020-02-05 – 2020-02-07 (×3): 1 via RESPIRATORY_TRACT
  Filled 2020-02-04: qty 28

## 2020-02-04 MED ORDER — SODIUM CHLORIDE 0.9 % IV SOLN: 250.0000 mL | INTRAVENOUS | Status: DC | PRN

## 2020-02-04 MED ORDER — ALBUTEROL SULFATE (2.5 MG/3ML) 0.083% IN NEBU: 2.5000 mg | INHALATION_SOLUTION | Freq: Three times a day (TID) | RESPIRATORY_TRACT | Status: DC | PRN

## 2020-02-04 MED ORDER — POLYETHYLENE GLYCOL 3350 17 G PO PACK: 17.0000 g | PACK | Freq: Every day | ORAL | Status: DC | PRN

## 2020-02-04 MED ORDER — CALCIUM CARBONATE ANTACID 500 MG PO CHEW
1.0000 | CHEWABLE_TABLET | Freq: Three times a day (TID) | ORAL | Status: DC
  Administered 2020-02-04 – 2020-02-05 (×2): 200 mg via ORAL
  Filled 2020-02-04 (×5): qty 1

## 2020-02-04 MED ORDER — BUPROPION HCL ER (XL) 300 MG PO TB24
300.0000 mg | ORAL_TABLET | Freq: Every day | ORAL | Status: DC
  Administered 2020-02-04 – 2020-02-07 (×4): 300 mg via ORAL
  Filled 2020-02-04 (×4): qty 1

## 2020-02-04 MED ORDER — SODIUM CHLORIDE 0.9% FLUSH: 3.0000 mL | INTRAVENOUS | Status: DC | PRN

## 2020-02-04 MED ORDER — ACETAMINOPHEN 650 MG RE SUPP: 650.0000 mg | Freq: Four times a day (QID) | RECTAL | Status: DC | PRN

## 2020-02-04 MED ORDER — MAGNESIUM HYDROXIDE 400 MG/5ML PO SUSP: 30.0000 mL | Freq: Every day | ORAL | Status: DC | PRN

## 2020-02-04 MED ORDER — SODIUM CHLORIDE 0.9% FLUSH
3.0000 mL | Freq: Two times a day (BID) | INTRAVENOUS | Status: DC
  Administered 2020-02-04 – 2020-02-06 (×4): 3 mL via INTRAVENOUS

## 2020-02-04 MED ORDER — DULOXETINE HCL 60 MG PO CPEP
60.0000 mg | ORAL_CAPSULE | Freq: Every day | ORAL | Status: DC
  Administered 2020-02-04 – 2020-02-07 (×4): 60 mg via ORAL
  Filled 2020-02-04 (×4): qty 1

## 2020-02-04 MED ORDER — ENSURE ENLIVE PO LIQD
237.0000 mL | Freq: Two times a day (BID) | ORAL | Status: DC
  Administered 2020-02-05 – 2020-02-07 (×5): 237 mL via ORAL

## 2020-02-04 MED ORDER — LINACLOTIDE 145 MCG PO CAPS
145.0000 ug | ORAL_CAPSULE | Freq: Every day | ORAL | Status: DC
  Administered 2020-02-05 – 2020-02-07 (×3): 145 ug via ORAL
  Filled 2020-02-04 (×3): qty 1

## 2020-02-04 MED ORDER — ASPIRIN EC 81 MG PO TBEC
81.0000 mg | DELAYED_RELEASE_TABLET | Freq: Every day | ORAL | Status: DC
  Administered 2020-02-04 – 2020-02-07 (×4): 81 mg via ORAL
  Filled 2020-02-04 (×4): qty 1

## 2020-02-04 MED ORDER — DEXTROSE-NACL 5-0.45 % IV SOLN: INTRAVENOUS | Status: DC

## 2020-02-04 MED ORDER — GABAPENTIN 100 MG PO CAPS
100.0000 mg | ORAL_CAPSULE | Freq: Two times a day (BID) | ORAL | Status: DC
  Administered 2020-02-04 – 2020-02-07 (×6): 100 mg via ORAL
  Filled 2020-02-04 (×6): qty 1

## 2020-02-04 MED ORDER — ONDANSETRON HCL 4 MG/2ML IJ SOLN
4.0000 mg | Freq: Once | INTRAMUSCULAR | Status: AC
  Administered 2020-02-04: 4 mg via INTRAVENOUS
  Filled 2020-02-04: qty 2

## 2020-02-04 MED ORDER — VITAMIN D 25 MCG (1000 UNIT) PO TABS
5000.0000 [IU] | ORAL_TABLET | Freq: Every day | ORAL | Status: DC
  Administered 2020-02-04 – 2020-02-07 (×4): 5000 [IU] via ORAL
  Filled 2020-02-04 (×4): qty 5

## 2020-02-04 MED ORDER — ROPINIROLE HCL 0.25 MG PO TABS
0.2500 mg | ORAL_TABLET | Freq: Every day | ORAL | Status: DC
  Administered 2020-02-04 – 2020-02-06 (×3): 0.25 mg via ORAL
  Filled 2020-02-04 (×3): qty 1

## 2020-02-04 MED ORDER — FENTANYL CITRATE (PF) 100 MCG/2ML IJ SOLN
50.0000 ug | Freq: Once | INTRAMUSCULAR | Status: AC
  Administered 2020-02-04: 50 ug via INTRAVENOUS
  Filled 2020-02-04: qty 2

## 2020-02-04 NOTE — ED Provider Notes (Signed)
[facility] EMERGENCY DEPARTMENT Provider Note   CSN: [id number] Arrival date & time: 02/04/20  9767     History Chief Complaint  Patient presents with  . Abdominal Pain    Sharon Jordan is a 74 y.o. female.  HPI Patient presents with abdominal pain.  Began last night.  Had recent admission in the hospital for enterocolitis required going to rehab after due to debilitation.  Has been home for close to 2 weeks now.  Has been doing well up until last night.  Has had some nausea without vomiting.  Has had some constipation and diarrhea.  States the pain is crampy and in most of her abdomen.  States it feels like what she was having previously.  No fevers.  No dysuria.    Past Medical History:  Diagnosis Date  . Anemia   . At risk for delirium   . Closed fracture of anterior column of left acetabulum (HCC)   . Closed fracture of pelvis (HCC)   . COPD (chronic obstructive pulmonary disease) (HCC)   . Depression   . History of rheumatic fever   . HTN (hypertension)   . Hyponatremia   . Lupus (HCC)   . RLS (restless legs syndrome)     Patient Active Problem List   Diagnosis Date Noted  . Enterocolitis 12/26/2019  . Closed trochanteric fracture of right femur with routine healing 10/28/19 11/13/2019  . Chronic respiratory failure with hypoxia (HCC)   . Anxiety   . Hip fracture (HCC) 10/27/2019  . Intertrochanteric fracture of left femur (HCC) 09/05/2019  . Fracture of acetabulum, left, closed (HCC) 09/05/2019  . Hypertension 09/05/2019  . COPD (chronic obstructive pulmonary disease) (HCC) 09/05/2019  . Depression 09/05/2019  . GERD (gastroesophageal reflux disease) 09/05/2019  . RLS (restless legs syndrome) 09/05/2019  . Anemia 08/26/2019  . At risk for delirium 08/26/2019  . Hyponatremia 08/26/2019  . Closed fracture of pelvis (HCC) 08/23/2019    Past Surgical History:  Procedure Laterality Date  . ABDOMINAL HYSTERECTOMY    . APPENDECTOMY    . 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 CLOSED MANIPULATION AND CLOSED TREATMENT OF LEFT ACETABULUM FRACTURE, BOTH COLUMNS Left   . HIP SURGERY Left   . JOINT REPLACEMENT    . REMOVAL OF DEEP HARDWARE, LEFT INTRAMEDULLARY NAIL Left    TFN  . 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 TONSILLECTOMY       OB History    Gravida  3   Para      Term      Preterm      AB      Living  2     SAB      TAB      Ectopic      Multiple      Live Births              No family history on file.  Social History   Tobacco Use  . Smoking status: Former Games developer  . Smokeless tobacco: Never Used  Vaping Use  . Vaping Use: Never used  Substance Use Topics  . Alcohol use: Never  . Drug use: Never    Home Medications Prior to Admission medications   Medication Sig Start Date End Date Taking? Authorizing Provider  acetaminophen (TYLENOL) 500 MG tablet Take 2 tablets (1,000 mg total) by mouth 3 (three) times daily. 01/06/20  Yes Reed, Tiffany L, DO  albuterol (PROVENTIL) (2.5 MG/3ML) 0.083% nebulizer solution Take 3 mLs (2.5 mg total) by nebulization every 8 (eight) hours as needed for wheezing or shortness of breath. 01/20/20  Yes Trudie ReedLassen, Arlo C, PA-C  alum & mag hydroxide-simeth (MAALOX/MYLANTA) 200-200-20 MG/5ML suspension Take 30 mLs by mouth every 4 (four) hours as needed for indigestion. 01/03/20  Yes Sherryll BurgerShah, Pratik D, DO  aspirin EC 81 MG tablet Take 81 mg by mouth 2 (two) times daily. QAM and QHS   Yes [provider]  Budeson-Glycopyrrol-Formoterol (BREZTRI AEROSPHERE) 160-9-4.8 MCG/ACT AERO Inhale 1 puff into the lungs daily. 01/20/20  Yes Lassen, Arlo C, PA-C  buPROPion (WELLBUTRIN XL) 300 MG 24 hr tablet Take 1 tablet (300 mg total) by mouth daily. 01/20/20  Yes Lassen, Arlo C, PA-C  calcium carbonate (TUMS - DOSED IN MG ELEMENTAL CALCIUM) 500 MG chewable tablet Chew 1 tablet by mouth 3 (three) times daily with meals.    Yes  [provider]  carboxymethylcellulose (ARTIFICIAL TEARS) 1 % ophthalmic solution Place 2 drops into both eyes 3 (three) times daily. Instill 2 drops both eyes three times a day as needed for dry eyes 10/22/19  Yes Lassen, Arlo C, PA-C  Cholecalciferol (VITAMIN D-3) 125 MCG (5000 UT) TABS Take 5,000 Units by mouth daily. 01/16/20  Yes Reed, Tiffany L, DO  docusate sodium (COLACE) 100 MG capsule Take 100 mg by mouth daily.   Yes [provider]  DULoxetine (CYMBALTA) 30 MG capsule Take 2 capsules (60 mg total) by mouth daily. 01/20/20  Yes Lassen, Arlo C, PA-C  feeding supplement, ENSURE ENLIVE, (ENSURE ENLIVE) LIQD Take 237 mLs by mouth 2 (two) times daily between meals. 01/03/20  Yes Shah, Pratik D, DO  gabapentin (NEURONTIN) 100 MG capsule Take 1 capsule (100 mg total) by mouth 2 (two) times daily. 01/20/20  Yes Lassen, Arlo C, PA-C  guaiFENesin (MUCINEX) 600 MG 12 hr tablet Take 2 tablets (1,200 mg total) by mouth 2 (two) times daily as needed for cough or to loosen phlegm. 01/06/20  Yes Reed, Tiffany L, DO  hyoscyamine (LEVSIN SL) 0.125 MG SL tablet Take 1 tablet (0.125 mg total) by mouth every 8 (eight) hours as needed for cramping. 01/20/20  Yes Roena MaladyLassen, Arlo C, PA-C  linaclotide (LINZESS) 145 MCG CAPS capsule Take 1 capsule (145 mcg total) by mouth daily before breakfast. 01/20/20  Yes Trudie ReedLassen, Arlo C, PA-C  magnesium hydroxide (MILK OF MAGNESIA) 400 MG/5ML suspension Take 30 mLs by mouth daily as needed for moderate constipation. 10/31/19  Yes Vassie LollMadera, Carlos, MD  pantoprazole (PROTONIX) 40 MG tablet Take 1 tablet (40 mg total) by mouth daily. 01/20/20  Yes Lassen, Arlo C, PA-C  psyllium (METAMUCIL) 58.6 % packet Take 1 packet by mouth at bedtime.   Yes [provider]  rOPINIRole (REQUIP) 0.25 MG tablet Take 1 tablet (0.25 mg total) by mouth at bedtime. 01/20/20  Yes Trudie ReedLassen, Arlo C, PA-C  simethicone (MYLICON) 80 MG chewable tablet Chew 1 tablet (80 mg total) by mouth 3 (three)  times daily as needed. 01/20/20  Yes Lassen, Arlo C, PA-C    Allergies    Amoxicillin, Cephalosporins, Clindamycin/lincomycin, Doxycycline, and Penicillins  Review of Systems   Review of Systems  Constitutional: Positive for appetite change.  HENT: Negative for congestion.   Respiratory: Negative for shortness of breath.   Cardiovascular: Negative for chest pain.  Gastrointestinal: Positive for abdominal pain, diarrhea and nausea. Negative for vomiting.  Genitourinary: Negative for flank  pain.  Musculoskeletal: Negative for back pain.  Skin: Negative for rash.  Neurological: Negative for weakness.  Psychiatric/Behavioral: Negative for confusion.    Physical Exam Updated Vital Signs BP (!) 171/78 (BP Location: Left Arm)   Pulse (!) 108   Temp 98.3 F (36.8 C)   Resp 19   Ht 5' (1.524 m)   Wt 53.1 kg   SpO2 92%   BMI 22.85 kg/m   Physical Exam Vitals reviewed.  Cardiovascular:     Rate and Rhythm: Regular rhythm. Tachycardia present.  Pulmonary:     Breath sounds: Normal breath sounds.  Abdominal:     Comments: Diffuse tenderness.  Worse however right lower quadrant.  No hernia palpated.  No rebound or guarding.  Skin:    General: Skin is warm.     Capillary Refill: Capillary refill takes less than 2 seconds.  Neurological:     Mental Status: She is alert and oriented to person, place, and time.     ED Results / Procedures / Treatments   Labs (all labs ordered are listed, but only abnormal results are displayed) Labs Reviewed  COMPREHENSIVE METABOLIC PANEL - Abnormal; Notable for the following components:      Result Value   Chloride 91 (*)    CO2 36 (*)    AST 13 (*)    All other components within normal limits  CBC WITH DIFFERENTIAL/PLATELET - Abnormal; Notable for the following components:   Hemoglobin 11.6 (*)    All other components within normal limits  LIPASE, BLOOD  URINALYSIS, ROUTINE W REFLEX MICROSCOPIC    EKG None  Radiology CT ABDOMEN  PELVIS W CONTRAST  Result Date: 02/04/2020 CLINICAL DATA:  74 year old presenting with acute onset of generalized abdominal pain. Current history of lupus. Surgical history includes appendectomy and hysterectomy. EXAM: CT ABDOMEN AND PELVIS WITH CONTRAST TECHNIQUE: Multidetector CT imaging of the abdomen and pelvis was performed using the standard protocol following bolus administration of intravenous contrast. CONTRAST:  OMNIPAQUE IOHEXOL 300 MG/ML IV. COMPARISON:  01/20/2020, 12/26/2019. FINDINGS: Lower chest: Heart size upper normal to slightly enlarged, unchanged. Mitral annular calcification. Visualized lung bases clear. Hepatobiliary: Liver normal in size and appearance. Surgically absent gallbladder. No unexpected biliary ductal dilation. Pancreas: Atrophic without evidence of mass or peripancreatic inflammation. Spleen: Normal in size and appearance. Adrenals/Urinary Tract: Normal appearing adrenal glands. Non-obstructing LEFT mid renal calculus measuring approximately 3 mm, unchanged. No urinary tract calculi elsewhere. No hydronephrosis involving either kidney. No focal parenchymal abnormalities involving either kidney. Irregular renal contours are likely related to persistent fetal lobulations, a developmental variant. Normal appearing urinary bladder. Stomach/Bowel: Moderately large hiatal hernia, unchanged. Stomach otherwise unremarkable for degree of distension. Likely duodenal diverticulum arising from the descending duodenum. Small bowel otherwise unremarkable. Normal-appearing small bowel loops are present LATERAL to the descending colon in the LEFT UPPER QUADRANT, possibly indicating an internal hernia. Moderate stool burden throughout the colon. Descending and sigmoid colon diverticulosis without evidence of acute diverticulitis. Surgically absent appendix. Vascular/Lymphatic: Moderate aorto-iliofemoral atherosclerosis without evidence of aneurysm. Normal-appearing portal venous and  systemic venous systems. No pathologic lymphadenopathy. Reproductive: Surgically absent uterus.  No adnexal masses. Other: Phleboliths in both sides of the pelvis. Musculoskeletal: Prior ORIF of a LEFT femoral neck fracture with healing. Osseous demineralization. Remote compression fracture of L1, unchanged. Diffuse degenerative disc disease and spondylosis throughout the lower thoracic and lumbar spine. Severe diffuse facet degenerative changes throughout the lumbar spine. Moderate to severe multifactorial spinal stenosis at L2-3, L3-4 and L4-5.  No acute findings. IMPRESSION: 1. No acute abnormalities involving the abdomen or pelvis. 2. Normal-appearing small bowel loops LATERAL to the descending colon in the LEFT UPPER QUADRANT, possibly indicating an internal hernia. No evidence of small bowel obstruction. 3. Descending and sigmoid colon diverticulosis without evidence of acute diverticulitis. 4. Moderately large hiatal hernia, unchanged. 5. Non-obstructing LEFT mid renal calculus measuring approximately 3 mm. 6. Moderate to severe multifactorial spinal stenosis at L2-3, L3-4 and L4-5. Aortic Atherosclerosis (ICD10-I70.0). Electronically Signed   By: Hulan Saas M.D.   On: 02/04/2020 09:55    Procedures Procedures (including critical care time)  Medications Ordered in ED Medications  ondansetron (ZOFRAN) injection 4 mg (4 mg Intravenous Given 02/04/20 0814)  fentaNYL (SUBLIMAZE) injection 50 mcg (50 mcg Intravenous Given 02/04/20 0814)  sodium chloride 0.9 % bolus 500 mL (0 mLs Intravenous Stopped 02/04/20 0950)  iohexol (OMNIPAQUE) 300 MG/ML solution 80 mL (100 mLs Intravenous Contrast Given 02/04/20 0909)  fentaNYL (SUBLIMAZE) injection 50 mcg (50 mcg Intravenous Given 02/04/20 1000)    ED Course  I have reviewed the triage vital signs and the nursing notes.  Pertinent labs & imaging results that were available during my care of the patient were reviewed by me and considered in my medical decision  making (see chart for details).    MDM Rules/Calculators/A&P                          Patient presents with recurrent abdominal pain.  Recent enterocolitis.  Has only had hysterectomy in terms of abdominal surgery.  Lab work overall reassuring.  However with return of pain CT scan done and showed no obstruction but showed potentially an internal hernia.  Discussed with Dr. Henreitta Leber from general surgery who will see patient in consult.  Recommends admission to internal medicine.  Patient has been requiring IV narcotics for pain control. Final Clinical Impression(s) / ED Diagnoses Final diagnoses:  Generalized abdominal pain    Rx / DC Orders ED Discharge Orders    None       Benjiman Core, MD 02/04/20 1046

## 2020-02-04 NOTE — Consult Note (Addendum)
I was present with the medical student for this service. I personally verified the history of present illness, performed the physical exam, and made the plan for this encounter. I have verified the medical student's documentation and made modifications where appropriately. I have personally documented in my own words a brief history, physical, and plan below.      Patient with diffuse abdominal pain. No nausea or vomiting currently. Had some earlier. Had a BM yesterday.  CT reviewed with Dr. Tyron Russell, no signs of obstruction, at the splenic flexure there is a loop of small bowel that crosses and cannot be fully followed, unlikely to be an internal hernia but cannot fully exclude,  diverticula in the descending/ sigmoid colon, but no diverticulitis currently   BP (!) 158/82   Pulse (!) 107   Temp 98.3 F (36.8 C)   Resp 17   Ht 5' (1.524 m)   Wt 53.1 kg   SpO2 99%   BMI 22.85 kg/m  NAD On [place] 2.5L  Soft, nondistended, tender epigastric and left side abdomen, no rebound or guarding  Discussed with patient it is really unlikely to have an internal hernia and no signs of obstruction.  No acute surgical intervention needed. Will do clears now and adv as tolerated. Monitor exam and labs.   If continued to represent to hospital may have to consider exploration to definitively rule out internal hernia.   Sharon Greenhouse, MD [place] [address] [place], [place] [address] [phone] (office) '  Reason for Consult: Abdominal Pain  Referring Physician: Benjiman Core, MD  Sh[place] is an 74 y.o. female with a medical history of HTN, COPD, Lupus and enterocolitis that presented to the Emergency Department for 1 day history of diffuse abdominal pain.   HPI:   SharonGauger reports that the abdominal pain began night after supper. She reports that it felt like a crampy pain that began in the epigstratic region and radiating to the left LLQ. She  rated the pain 10/10. The pain was followed by nausea and vomiting 2x. She also reports 3 bowel movements last night: first was hard dark stools, followed by normal soft stools and then diarrhea. She does not report any melena or hematochezia. She denies any dysuria or increased frequency or urgency. She does not report any alleviating or aggravating factors. She was previously admitted 4 weeks ago for a enterocolitis. She reports that this pain felt similar and decided to visit the ED. Denies any fever or chills. Denies chest pain or SOB.  ED ordered CT Abdomen/Pelvis with Contrast was concerned for a possible internal hernia with small bowel loops LATERAL to the descending colon in LUQ, and surgery was consulted.      Past Medical History:  Diagnosis Date  . Anemia   . At risk for delirium   . Closed fracture of anterior column of left acetabulum (HCC)   . Closed fracture of pelvis (HCC)   . COPD (chronic obstructive pulmonary disease) (HCC)   . Depression   . History of rheumatic fever   . HTN (hypertension)   . Hyponatremia   . Lupus (HCC)   . RLS (restless legs syndrome)     Past Surgical History:  Procedure Laterality Date  . ABDOMINAL HYSTERECTOMY    . APPENDECTOMY    . 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 CLOSED MANIPULATION AND CLOSED TREATMENT OF LEFT ACETABULUM FRACTURE, BOTH COLUMNS Left   . HIP  SURGERY Left   . JOINT REPLACEMENT    . REMOVAL OF DEEP HARDWARE, LEFT INTRAMEDULLARY NAIL Left    TFN  . 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 TONSILLECTOMY      History reviewed. No pertinent family history.  Social History:  reports that she has quit smoking. She has never used smokeless tobacco. She reports that she does not drink alcohol and does not use drugs.  Allergies:  Allergies  Allergen Reactions  . Amoxicillin Nausea Only  . Cephalosporins Nausea Only  . Clindamycin/Lincomycin Nausea  Only  . Doxycycline Nausea Only  . Penicillins Diarrhea    Patient states she is allergic to 'all antibiotics' states has diarrhea and nausea.    Medications: I have reviewed the patient's current medications.  Results for orders placed or performed during the hospital encounter of 02/04/20 (from the past 48 hour(s))  Comprehensive metabolic panel     Status: Abnormal   Collection Time: 02/04/20  7:59 AM  Result Value Ref Range   Sodium 136 135 - 145 mmol/L   Potassium 3.8 3.5 - 5.1 mmol/L   Chloride 91 (L) 98 - 111 mmol/L   CO2 36 (H) 22 - 32 mmol/L   Glucose, Bld 99 70 - 99 mg/dL    Comment: Glucose reference range applies only to samples taken after fasting for at least 8 hours.   BUN 15 8 - 23 mg/dL   Creatinine, Ser 1.01 0.44 - 1.00 mg/dL   Calcium 9.3 8.9 - 75.1 mg/dL   Total Protein 6.5 6.5 - 8.1 g/dL   Albumin 3.8 3.5 - 5.0 g/dL   AST 13 (L) 15 - 41 U/L   ALT 11 0 - 44 U/L   Alkaline Phosphatase 66 38 - 126 U/L   Total Bilirubin 0.7 0.3 - 1.2 mg/dL   GFR calc non Af Amer >60 >60 mL/min   GFR calc Af Amer >60 >60 mL/min   Anion gap 9 5 - 15    Comment: Performed at [facility], [address] [place], [place] [address]  Lipase, blood     Status: None   Collection Time: 02/04/20  7:59 AM  Result Value Ref Range   Lipase 23 11 - 51 U/L    Comment: Performed at [facility], [address], [place], [place] [address]  CBC with Differential     Status: Abnormal   Collection Time: 02/04/20  7:59 AM  Result Value Ref Range   WBC 6.7 4.0 - 10.5 K/uL   RBC 3.93 3.87 - 5.11 MIL/uL   Hemoglobin 11.6 (L) 12.0 - 15.0 g/dL   HCT 42.3 36 - 46 %   MCV 95.7 80.0 - 100.0 fL   MCH 29.5 26.0 - 34.0 pg   MCHC 30.9 30.0 - 36.0 g/dL   RDW 53.6 14.4 - 31.5 %   Platelets 276 150 - 400 K/uL   nRBC 0.0 0.0 - 0.2 %   Neutrophils Relative % 75 %   Neutro Abs 5.0 1.7 - 7.7 K/uL   Lymphocytes Relative 13 %   Lymphs Abs 0.9 0.7 - 4.0 K/uL   Monocytes Relative 9 %   Monocytes Absolute  0.6 0 - 1 K/uL   Eosinophils Relative 3 %   Eosinophils Absolute 0.2 0 - 0 K/uL   Basophils Relative 0 %   Basophils Absolute 0.0 0 - 0 K/uL   Immature Granulocytes 0 %   Abs Immature Granulocytes 0.03  0.00 - 0.07 K/uL    Comment: Performed at [facility], [address] [place], [place] [address]  SARS Coronavirus 2 by RT PCR (hospital order, performed in [facility] hospital lab) Nasopharyngeal Nasopharyngeal Swab     Status: None   Collection Time: 02/04/20 11:16 AM   Specimen: Nasopharyngeal Swab  Result Value Ref Range   SARS Coronavirus 2 NEGATIVE NEGATIVE    Comment: (NOTE) SARS-CoV-2 target nucleic acids are NOT DETECTED.  The SARS-CoV-2 RNA is generally detectable in upper and lower respiratory specimens during the acute phase of infection. The lowest concentration of SARS-CoV-2 viral copies this assay can detect is 250 copies / mL. A negative result does not preclude SARS-CoV-2 infection and should not be used as the sole basis for treatment or other patient management decisions.  A negative result may occur with improper specimen collection / handling, submission of specimen other than nasopharyngeal swab, presence of viral mutation(s) within the areas targeted by this assay, and inadequate number of viral copies (<250 copies / mL). A negative result must be combined with clinical observations, patient history, and epidemiological information.  Fact Sheet for Patients:   BoilerBrush.com.cy  Fact Sheet for Healthcare Providers: https://pope.com/  This test is not yet approved or  cleared by the [place] FDA and has been authorized for detection and/or diagnosis of SARS-CoV-2 by FDA under an Emergency Use Authorization (EUA).  This EUA will remain in effect (meaning this test can be used) for the duration of the COVID-19 declaration under Section 564(b)(1) of the Act, 21 U.S.C. section 360bbb-3(b)(1), unless the  authorization is terminated or revoked sooner.  Performed at [facility], [address], [place], [place] [address]     CT ABDOMEN PELVIS W CONTRAST  Result Date: 02/04/2020 CLINICAL DATA:  74 year old presenting with acute onset of generalized abdominal pain. Current history of lupus. Surgical history includes appendectomy and hysterectomy. EXAM: CT ABDOMEN AND PELVIS WITH CONTRAST TECHNIQUE: Multidetector CT imaging of the abdomen and pelvis was performed using the standard protocol following bolus administration of intravenous contrast. CONTRAST:  OMNIPAQUE IOHEXOL 300 MG/ML IV. COMPARISON:  01/20/2020, 12/26/2019. FINDINGS: Lower chest: Heart size upper normal to slightly enlarged, unchanged. Mitral annular calcification. Visualized lung bases clear. Hepatobiliary: Liver normal in size and appearance. Surgically absent gallbladder. No unexpected biliary ductal dilation. Pancreas: Atrophic without evidence of mass or peripancreatic inflammation. Spleen: Normal in size and appearance. Adrenals/Urinary Tract: Normal appearing adrenal glands. Non-obstructing LEFT mid renal calculus measuring approximately 3 mm, unchanged. No urinary tract calculi elsewhere. No hydronephrosis involving either kidney. No focal parenchymal abnormalities involving either kidney. Irregular renal contours are likely related to persistent fetal lobulations, a developmental variant. Normal appearing urinary bladder. Stomach/Bowel: Moderately large hiatal hernia, unchanged. Stomach otherwise unremarkable for degree of distension. Likely duodenal diverticulum arising from the descending duodenum. Small bowel otherwise unremarkable. Normal-appearing small bowel loops are present LATERAL to the descending colon in the LEFT UPPER QUADRANT, possibly indicating an internal hernia. Moderate stool burden throughout the colon. Descending and sigmoid colon diverticulosis without evidence of acute diverticulitis. Surgically absent  appendix. Vascular/Lymphatic: Moderate aorto-iliofemoral atherosclerosis without evidence of aneurysm. Normal-appearing portal venous and systemic venous systems. No pathologic lymphadenopathy. Reproductive: Surgically absent uterus.  No adnexal masses. Other: Phleboliths in both sides of the pelvis. Musculoskeletal: Prior ORIF of a LEFT femoral neck fracture with healing. Osseous demineralization. Remote compression fracture of L1, unchanged. Diffuse degenerative disc disease and spondylosis throughout the lower thoracic and lumbar spine. Severe diffuse facet degenerative  changes throughout the lumbar spine. Moderate to severe multifactorial spinal stenosis at L2-3, L3-4 and L4-5. No acute findings. IMPRESSION: 1. No acute abnormalities involving the abdomen or pelvis. 2. Normal-appearing small bowel loops LATERAL to the descending colon in the LEFT UPPER QUADRANT, possibly indicating an internal hernia. No evidence of small bowel obstruction. 3. Descending and sigmoid colon diverticulosis without evidence of acute diverticulitis. 4. Moderately large hiatal hernia, unchanged. 5. Non-obstructing LEFT mid renal calculus measuring approximately 3 mm. 6. Moderate to severe multifactorial spinal stenosis at L2-3, L3-4 and L4-5. Aortic Atherosclerosis (ICD10-I70.0). Electronically Signed   By: Hulan Saashomas  Lawrence M.D.   On: 02/04/2020 09:55    ROS:  Constitutional: negative for chills and fevers Gastrointestinal: positive for abdominal pain, diarrhea, nausea and vomiting Genitourinary:negative for dysuria  Blood pressure (!) 158/82, pulse (!) 107, temperature 98.3 F (36.8 C), resp. rate 17, height 5' (1.524 m), weight 53.1 kg, SpO2 99 %. Physical Exam:  General: NAD, conversant HEENT: Head [place]/AT, Normal sclera, MMM Cardiovascular: Regular rhythm, tachycardia, normal S1/S2 Pulmonary: Normal work of breathing, clear breath sounds bilaterally without wheezes  Abdominal: Soft, non-distended. Normal bowel sounds.  Tenderness to light palpation in LLQ MSK: FROM in extremities  Skin: Warm and dry Neuro: Alerted and oriented to person, place and time       Assessment/Plan:  [place] is a 74 yo female with history of COPD, HTN, Lupus and recent admission for enterocolitis that presented the ED with diffuse abdominal pain.  CT showed small bowel lateral to descending colon and was concerned for possible internal hernia, her previous CT from 12/31/19 looks similar, but will review scans with Radiologist. She has previous surgical history of appendectomy and hysterectomy, but no other abdominal surgeries to suggest an internal hernia.  With her symptoms of nausea, vomiting, diarrhea and localized LLQ pain along with diverticulosis on seen on today's CT, it's possible that she is developing an early diverticulitis. Currently she is afebrile, with normal WBC.    Plan -review CT with Radiologist [facility] Admission -Monitor Labs  -PO challenge    Lucretia RoersLindsay C Tomorrow Dehaas, medical student 02/04/2020, 1:37 PM

## 2020-02-04 NOTE — H&P (Signed)
Patient Demographics:    Sharon Jordan, is a 74 y.o. female  MRN: [id number]   DOB - [birth year]  Admit Date - 02/04/2020  Outpatient Primary MD for the patient is [name], MD   Assessment & Plan:    Active Problems:   Abdominal pain   Assessment & Plan: 1-abdominal pain---recent treatment for enterocolitis -The abdomen and pelvis findings discussed with general surgeon -Evaluated by general surgeon clinical evaluation not impressive for possible internal hernia or bowel obstruction however CT abdomen/imaging studies suggest possible internal hernia -General surgery recommends observation and serial clinical exams to rule out possible obstructive process/internal hernia --- Okay for clear liquid diet  2-Intertrochanteric fracture of left femur (HCC)  -No signs of acute disalignment or fracture -Chronic changes associated with previous surgery. -Continue as needed analgesics -PT evaluation will be requested.  3-Hypertension -Stable resume home Rx  4-chronic hypoxic respiratory failure due to COPD (chronic obstructive pulmonary disease) (HCC) -Stable, continue bronchodilators and supplemental oxygen. -Appears to be at baseline at this time  5-history of depression/anxiety -Most recent ideation hallucination -Continue as needed anxiolytics.  6-RLS (restless legs syndrome) -Continue the use of Requip  7-history of osteoporosis -Continue Fosamax after discharge.   Disposition/Need for in-Hospital Stay- patient unable to be discharged at this time due to --need to rule out internal hernia or obstructive bowel, unable to discharge until tolerating oral intake  Dispo: The patient is from: Home              Anticipated d/c is to: Home              Anticipated d/c date is: 1 day               Patient currently is not medically stable to d/c.   With History of - Reviewed by me  Past Medical History:  Diagnosis Date  . Anemia   . At risk for delirium   . Closed fracture of anterior column of left acetabulum (HCC)   . Closed fracture of pelvis (HCC)   . COPD (chronic obstructive pulmonary disease) (HCC)   . Depression   . History of rheumatic fever   . HTN (hypertension)   . Hyponatremia   . Lupus (HCC)   . RLS (restless legs syndrome)       Past Surgical History:  Procedure Laterality Date  . ABDOMINAL HYSTERECTOMY    . APPENDECTOMY    . 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 CLOSED MANIPULATION AND CLOSED TREATMENT OF LEFT ACETABULUM FRACTURE, BOTH COLUMNS Left   . HIP SURGERY Left   . JOINT REPLACEMENT    . REMOVAL OF DEEP HARDWARE, LEFT INTRAMEDULLARY NAIL Left    TFN  . REPAIR OF LEFT PERITROCHANTERIC MALUNION WITH USE OF A BLADE PLATE Left 63/7858  . TONSILLECTOMY        Chief Complaint  Patient presents with  . Abdominal Pain  HPI:    Sharon Jordan  is a 74 y.o. female  with medical history significant ofCOPD O2 dependent 2 L at home, HTN, recent history of closed fracture of anterior left acetabulum, pelvic fracture,,chronic anemia, episodic delirium,depression, chronic hyponatremia, lupus, RLS presents from home with abdominal pain after being recently treated in the hospital for enterocolitis and then discharged to SNF she was discharged from SNF to home recently  --No fever no chills no further emesis -Evaluated by general surgeon clinical evaluation not impressive for possible internal hernia or bowel obstruction however CT abdomen/imaging studies suggest possible internal hernia -General surgery recommends observation and serial clinical exams to rule out possible obstructive process/internal hernia  -No fever no chills no chest pains no palpitations no dizziness -CT abdomen and pelvis noted discussed  with general surgeon -CMP noted and largely unremarkable -Lipase 23 -CBC WNL except for hemoglobin of 11.6 -UA with increased specific gravity with no evidence of UTI     Review of systems:    In addition to the HPI above,   A full Review of  Systems was done, all other systems reviewed are negative except as noted above in HPI , .    Social History:  Reviewed by me    Social History   Tobacco Use  . Smoking status: Former Games developermoker  . Smokeless tobacco: Never Used  Substance Use Topics  . Alcohol use: Never       Family History :  Reviewed by me  HTN   Home Medications:   Prior to Admission medications   Medication Sig Start Date End Date Taking? Authorizing Provider  acetaminophen (TYLENOL) 500 MG tablet Take 2 tablets (1,000 mg total) by mouth 3 (three) times daily. 01/06/20  Yes Reed, Tiffany L, DO  albuterol (PROVENTIL) (2.5 MG/3ML) 0.083% nebulizer solution Take 3 mLs (2.5 mg total) by nebulization every 8 (eight) hours as needed for wheezing or shortness of breath. 01/20/20  Yes Trudie ReedLassen, Arlo C, PA-C  alum & mag hydroxide-simeth (MAALOX/MYLANTA) 200-200-20 MG/5ML suspension Take 30 mLs by mouth every 4 (four) hours as needed for indigestion. 01/03/20  Yes Sherryll BurgerShah, Pratik D, DO  aspirin EC 81 MG tablet Take 81 mg by mouth 2 (two) times daily. QAM and QHS   Yes [provider]  Budeson-Glycopyrrol-Formoterol (BREZTRI AEROSPHERE) 160-9-4.8 MCG/ACT AERO Inhale 1 puff into the lungs daily. 01/20/20  Yes Lassen, Arlo C, PA-C  buPROPion (WELLBUTRIN XL) 300 MG 24 hr tablet Take 1 tablet (300 mg total) by mouth daily. 01/20/20  Yes Lassen, Arlo C, PA-C  calcium carbonate (TUMS - DOSED IN MG ELEMENTAL CALCIUM) 500 MG chewable tablet Chew 1 tablet by mouth 3 (three) times daily with meals.    Yes [provider]  carboxymethylcellulose (ARTIFICIAL TEARS) 1 % ophthalmic solution Place 2 drops into both eyes 3 (three) times daily. Instill 2 drops both eyes three times a  day as needed for dry eyes 10/22/19  Yes Lassen, Arlo C, PA-C  Cholecalciferol (VITAMIN D-3) 125 MCG (5000 UT) TABS Take 5,000 Units by mouth daily. 01/16/20  Yes Reed, Tiffany L, DO  docusate sodium (COLACE) 100 MG capsule Take 100 mg by mouth daily.   Yes [provider]  DULoxetine (CYMBALTA) 30 MG capsule Take 2 capsules (60 mg total) by mouth daily. 01/20/20  Yes Lassen, Arlo C, PA-C  feeding supplement, ENSURE ENLIVE, (ENSURE ENLIVE) LIQD Take 237 mLs by mouth 2 (two) times daily between meals. 01/03/20  Yes Sherryll BurgerShah, Pratik D, DO  gabapentin (  NEURONTIN) 100 MG capsule Take 1 capsule (100 mg total) by mouth 2 (two) times daily. 01/20/20  Yes Lassen, Arlo C, PA-C  guaiFENesin (MUCINEX) 600 MG 12 hr tablet Take 2 tablets (1,200 mg total) by mouth 2 (two) times daily as needed for cough or to loosen phlegm. 01/06/20  Yes Reed, Tiffany L, DO  hyoscyamine (LEVSIN SL) 0.125 MG SL tablet Take 1 tablet (0.125 mg total) by mouth every 8 (eight) hours as needed for cramping. 01/20/20  Yes Roena Malady, PA-C  linaclotide (LINZESS) 145 MCG CAPS capsule Take 1 capsule (145 mcg total) by mouth daily before breakfast. 01/20/20  Yes Trudie Reed, Arlo C, PA-C  magnesium hydroxide (MILK OF MAGNESIA) 400 MG/5ML suspension Take 30 mLs by mouth daily as needed for moderate constipation. 10/31/19  Yes Vassie Loll, MD  pantoprazole (PROTONIX) 40 MG tablet Take 1 tablet (40 mg total) by mouth daily. 01/20/20  Yes Lassen, Arlo C, PA-C  psyllium (METAMUCIL) 58.6 % packet Take 1 packet by mouth at bedtime.   Yes [provider]  rOPINIRole (REQUIP) 0.25 MG tablet Take 1 tablet (0.25 mg total) by mouth at bedtime. 01/20/20  Yes Trudie Reed, Arlo C, PA-C  simethicone (MYLICON) 80 MG chewable tablet Chew 1 tablet (80 mg total) by mouth 3 (three) times daily as needed. 01/20/20  Yes Edmon Crape C, PA-C     Allergies:     Allergies  Allergen Reactions  . Amoxicillin Nausea Only  . Cephalosporins Nausea Only  .  Clindamycin/Lincomycin Nausea Only  . Doxycycline Nausea Only  . Penicillins Diarrhea    Patient states she is allergic to 'all antibiotics' states has diarrhea and nausea.     Physical Exam:   Vitals  Blood pressure (!) 153/89, pulse (!) 101, temperature 98.3 F (36.8 C), temperature source Oral, resp. rate 16, height 5' (1.524 m), weight 53.1 kg, SpO2 100 %.  Physical Examination: General appearance - alert, well appearing, and in no distress  Mental status - alert, oriented to person, place, and time,  Eyes - sclera anicteric Nose- [place] 2L/min Neck - supple, no JVD elevation , Chest - clear  to auscultation bilaterally, symmetrical air movement,  Heart - S1 and S2 normal, regular  Abdomen - soft, not distended, generalized abdominal tenderness without frank rebound or guarding  neurological - screening mental status exam normal, neck supple without rigidity, cranial nerves II through XII intact, DTR's normal and symmetric, generalized weakness but no new focal deficits Extremities - no pedal edema noted, intact peripheral pulses  Skin - warm, dry     Data Review:    CBC Recent Labs  Lab 02/04/20 0759  WBC 6.7  HGB 11.6*  HCT 37.6  PLT 276  MCV 95.7  MCH 29.5  MCHC 30.9  RDW 14.7  LYMPHSABS 0.9  MONOABS 0.6  EOSABS 0.2  BASOSABS 0.0   ------------------------------------------------------------------------------------------------------------------  Chemistries  Recent Labs  Lab 02/04/20 0759  NA 136  K 3.8  CL 91*  CO2 36*  GLUCOSE 99  BUN 15  CREATININE 0.72  CALCIUM 9.3  AST 13*  ALT 11  ALKPHOS 66  BILITOT 0.7   ------------------------------------------------------------------------------------------------------------------ estimated creatinine clearance is 43.6 mL/min (by C-G formula based on SCr of 0.72 mg/dL). ------------------------------------------------------------------------------------------------------------------ No results for  input(s): TSH, T4TOTAL, T3FREE, THYROIDAB in the last 72 hours.  Invalid input(s): FREET3   Coagulation profile No results for input(s): INR, PROTIME in the last 168 hours. ------------------------------------------------------------------------------------------------------------------- No results for input(s): DDIMER in the last 72  hours. -------------------------------------------------------------------------------------------------------------------  Cardiac Enzymes No results for input(s): CKMB, TROPONINI, MYOGLOBIN in the last 168 hours.  Invalid input(s): CK ------------------------------------------------------------------------------------------------------------------    Component Value Date/Time   BNP 91.0 12/26/2019 0918     ---------------------------------------------------------------------------------------------------------------  Urinalysis    Component Value Date/Time   COLORURINE YELLOW 02/04/2020 0759   APPEARANCEUR HAZY (A) 02/04/2020 0759   LABSPEC 1.039 (H) 02/04/2020 0759   PHURINE 8.0 02/04/2020 0759   GLUCOSEU NEGATIVE 02/04/2020 0759   HGBUR NEGATIVE 02/04/2020 0759   BILIRUBINUR NEGATIVE 02/04/2020 0759   KETONESUR NEGATIVE 02/04/2020 0759   PROTEINUR NEGATIVE 02/04/2020 0759   NITRITE NEGATIVE 02/04/2020 0759   LEUKOCYTESUR NEGATIVE 02/04/2020 0759    ----------------------------------------------------------------------------------------------------------------   Imaging Results:    CT ABDOMEN PELVIS W CONTRAST  Result Date: 02/04/2020 CLINICAL DATA:  74 year old presenting with acute onset of generalized abdominal pain. Current history of lupus. Surgical history includes appendectomy and hysterectomy. EXAM: CT ABDOMEN AND PELVIS WITH CONTRAST TECHNIQUE: Multidetector CT imaging of the abdomen and pelvis was performed using the standard protocol following bolus administration of intravenous contrast. CONTRAST:  OMNIPAQUE IOHEXOL 300  MG/ML IV. COMPARISON:  01/20/2020, 12/26/2019. FINDINGS: Lower chest: Heart size upper normal to slightly enlarged, unchanged. Mitral annular calcification. Visualized lung bases clear. Hepatobiliary: Liver normal in size and appearance. Surgically absent gallbladder. No unexpected biliary ductal dilation. Pancreas: Atrophic without evidence of mass or peripancreatic inflammation. Spleen: Normal in size and appearance. Adrenals/Urinary Tract: Normal appearing adrenal glands. Non-obstructing LEFT mid renal calculus measuring approximately 3 mm, unchanged. No urinary tract calculi elsewhere. No hydronephrosis involving either kidney. No focal parenchymal abnormalities involving either kidney. Irregular renal contours are likely related to persistent fetal lobulations, a developmental variant. Normal appearing urinary bladder. Stomach/Bowel: Moderately large hiatal hernia, unchanged. Stomach otherwise unremarkable for degree of distension. Likely duodenal diverticulum arising from the descending duodenum. Small bowel otherwise unremarkable. Normal-appearing small bowel loops are present LATERAL to the descending colon in the LEFT UPPER QUADRANT, possibly indicating an internal hernia. Moderate stool burden throughout the colon. Descending and sigmoid colon diverticulosis without evidence of acute diverticulitis. Surgically absent appendix. Vascular/Lymphatic: Moderate aorto-iliofemoral atherosclerosis without evidence of aneurysm. Normal-appearing portal venous and systemic venous systems. No pathologic lymphadenopathy. Reproductive: Surgically absent uterus.  No adnexal masses. Other: Phleboliths in both sides of the pelvis. Musculoskeletal: Prior ORIF of a LEFT femoral neck fracture with healing. Osseous demineralization. Remote compression fracture of L1, unchanged. Diffuse degenerative disc disease and spondylosis throughout the lower thoracic and lumbar spine. Severe diffuse facet degenerative changes throughout  the lumbar spine. Moderate to severe multifactorial spinal stenosis at L2-3, L3-4 and L4-5. No acute findings. IMPRESSION: 1. No acute abnormalities involving the abdomen or pelvis. 2. Normal-appearing small bowel loops LATERAL to the descending colon in the LEFT UPPER QUADRANT, possibly indicating an internal hernia. No evidence of small bowel obstruction. 3. Descending and sigmoid colon diverticulosis without evidence of acute diverticulitis. 4. Moderately large hiatal hernia, unchanged. 5. Non-obstructing LEFT mid renal calculus measuring approximately 3 mm. 6. Moderate to severe multifactorial spinal stenosis at L2-3, L3-4 and L4-5. Aortic Atherosclerosis (ICD10-I70.0). Electronically Signed   By: Hulan Saas M.D.   On: 02/04/2020 09:55    Radiological Exams on Admission: CT ABDOMEN PELVIS W CONTRAST  Result Date: 02/04/2020 CLINICAL DATA:  74 year old presenting with acute onset of generalized abdominal pain. Current history of lupus. Surgical history includes appendectomy and hysterectomy. EXAM: CT ABDOMEN AND PELVIS WITH CONTRAST TECHNIQUE: Multidetector CT imaging of the abdomen and pelvis was performed using  the standard protocol following bolus administration of intravenous contrast. CONTRAST:  OMNIPAQUE IOHEXOL 300 MG/ML IV. COMPARISON:  01/20/2020, 12/26/2019. FINDINGS: Lower chest: Heart size upper normal to slightly enlarged, unchanged. Mitral annular calcification. Visualized lung bases clear. Hepatobiliary: Liver normal in size and appearance. Surgically absent gallbladder. No unexpected biliary ductal dilation. Pancreas: Atrophic without evidence of mass or peripancreatic inflammation. Spleen: Normal in size and appearance. Adrenals/Urinary Tract: Normal appearing adrenal glands. Non-obstructing LEFT mid renal calculus measuring approximately 3 mm, unchanged. No urinary tract calculi elsewhere. No hydronephrosis involving either kidney. No focal parenchymal abnormalities involving  either kidney. Irregular renal contours are likely related to persistent fetal lobulations, a developmental variant. Normal appearing urinary bladder. Stomach/Bowel: Moderately large hiatal hernia, unchanged. Stomach otherwise unremarkable for degree of distension. Likely duodenal diverticulum arising from the descending duodenum. Small bowel otherwise unremarkable. Normal-appearing small bowel loops are present LATERAL to the descending colon in the LEFT UPPER QUADRANT, possibly indicating an internal hernia. Moderate stool burden throughout the colon. Descending and sigmoid colon diverticulosis without evidence of acute diverticulitis. Surgically absent appendix. Vascular/Lymphatic: Moderate aorto-iliofemoral atherosclerosis without evidence of aneurysm. Normal-appearing portal venous and systemic venous systems. No pathologic lymphadenopathy. Reproductive: Surgically absent uterus.  No adnexal masses. Other: Phleboliths in both sides of the pelvis. Musculoskeletal: Prior ORIF of a LEFT femoral neck fracture with healing. Osseous demineralization. Remote compression fracture of L1, unchanged. Diffuse degenerative disc disease and spondylosis throughout the lower thoracic and lumbar spine. Severe diffuse facet degenerative changes throughout the lumbar spine. Moderate to severe multifactorial spinal stenosis at L2-3, L3-4 and L4-5. No acute findings. IMPRESSION: 1. No acute abnormalities involving the abdomen or pelvis. 2. Normal-appearing small bowel loops LATERAL to the descending colon in the LEFT UPPER QUADRANT, possibly indicating an internal hernia. No evidence of small bowel obstruction. 3. Descending and sigmoid colon diverticulosis without evidence of acute diverticulitis. 4. Moderately large hiatal hernia, unchanged. 5. Non-obstructing LEFT mid renal calculus measuring approximately 3 mm. 6. Moderate to severe multifactorial spinal stenosis at L2-3, L3-4 and L4-5. Aortic Atherosclerosis (ICD10-I70.0).  Electronically Signed   By: Hulan Saas M.D.   On: 02/04/2020 09:55    DVT Prophylaxis -SCD /heparin AM Labs Ordered, also please review Full Orders  Family Communication: Admission, patients condition and plan of care including tests being ordered have been discussed with the patient who indicate understanding and agree with the plan   Code Status - Full Code  Likely DC to  Home once tolerating oral intake  Condition   stable  Shon Hale M.D on 02/04/2020 at 7:36 PM Go to [email or web address] -  for contact info  [facility] - Office  [phone]

## 2020-02-04 NOTE — ED Triage Notes (Signed)
Pt came in via [facility] from home c/o abd pain all over. Pt stated she had a bowel infection not to Quinnie Barcelo ago. Pain started last night.

## 2020-02-04 NOTE — Progress Notes (Signed)
Full Consult Note To Follow.  Patient with epigastric and left sided pain. Has a recent history of gastroenteritis admission and discharge to SNF. Developed pain again and had 2 episodes of vomiting. Says she had Bms, constipated and then has had diarrhea. She reports her last stool yesterday.  She has had diverticulitis in the past per report.  CT with question of internal hernia due to bowel being lateral to the descending colon. On my review looks like this flips anteriorly over and I do not appreciate any place where small bowel would go through mesentery or go inferior and loop around. I will review her imaging with radiology. Diverticula of the descending/ sigmoid colon area where tender.   Plan for hospitalist admission. Monitor exam Monitor labs Can trial clears for now and if vomits again NPO  Discussed with patient that I do not think this is an internal hernia and that she could be developing something like diverticulitis versus colitis. Labs are all reassuring.   No acute surgical intervention indicated now. If radiology is worried about internal hernia may have to explore or if continues to get readmitted with similar findings may have to explore in the future.  [name], MD [facility] [name] [place], [place] [address] [phone] (office)

## 2020-02-05 DIAGNOSIS — S72142A Displaced intertrochanteric fracture of left femur, initial encounter for closed fracture: Secondary | ICD-10-CM | POA: Diagnosis present

## 2020-02-05 DIAGNOSIS — D649 Anemia, unspecified: Secondary | ICD-10-CM | POA: Diagnosis present

## 2020-02-05 DIAGNOSIS — Z9981 Dependence on supplemental oxygen: Secondary | ICD-10-CM | POA: Diagnosis not present

## 2020-02-05 DIAGNOSIS — K529 Noninfective gastroenteritis and colitis, unspecified: Secondary | ICD-10-CM | POA: Diagnosis present

## 2020-02-05 DIAGNOSIS — G2581 Restless legs syndrome: Secondary | ICD-10-CM | POA: Diagnosis present

## 2020-02-05 DIAGNOSIS — Z880 Allergy status to penicillin: Secondary | ICD-10-CM | POA: Diagnosis not present

## 2020-02-05 DIAGNOSIS — K449 Diaphragmatic hernia without obstruction or gangrene: Secondary | ICD-10-CM | POA: Diagnosis present

## 2020-02-05 DIAGNOSIS — Z881 Allergy status to other antibiotic agents status: Secondary | ICD-10-CM | POA: Diagnosis not present

## 2020-02-05 DIAGNOSIS — J449 Chronic obstructive pulmonary disease, unspecified: Secondary | ICD-10-CM | POA: Diagnosis present

## 2020-02-05 DIAGNOSIS — Z79899 Other long term (current) drug therapy: Secondary | ICD-10-CM | POA: Diagnosis not present

## 2020-02-05 DIAGNOSIS — I10 Essential (primary) hypertension: Secondary | ICD-10-CM | POA: Diagnosis present

## 2020-02-05 DIAGNOSIS — F419 Anxiety disorder, unspecified: Secondary | ICD-10-CM | POA: Diagnosis present

## 2020-02-05 DIAGNOSIS — R443 Hallucinations, unspecified: Secondary | ICD-10-CM | POA: Diagnosis present

## 2020-02-05 DIAGNOSIS — Z90710 Acquired absence of both cervix and uterus: Secondary | ICD-10-CM | POA: Diagnosis not present

## 2020-02-05 DIAGNOSIS — Z8249 Family history of ischemic heart disease and other diseases of the circulatory system: Secondary | ICD-10-CM | POA: Diagnosis not present

## 2020-02-05 DIAGNOSIS — Z888 Allergy status to other drugs, medicaments and biological substances status: Secondary | ICD-10-CM | POA: Diagnosis not present

## 2020-02-05 DIAGNOSIS — S32432A Displaced fracture of anterior column [iliopubic] of left acetabulum, initial encounter for closed fracture: Secondary | ICD-10-CM | POA: Diagnosis present

## 2020-02-05 DIAGNOSIS — F329 Major depressive disorder, single episode, unspecified: Secondary | ICD-10-CM | POA: Diagnosis present

## 2020-02-05 DIAGNOSIS — M810 Age-related osteoporosis without current pathological fracture: Secondary | ICD-10-CM | POA: Diagnosis present

## 2020-02-05 DIAGNOSIS — N12 Tubulo-interstitial nephritis, not specified as acute or chronic: Secondary | ICD-10-CM | POA: Diagnosis present

## 2020-02-05 DIAGNOSIS — J9611 Chronic respiratory failure with hypoxia: Secondary | ICD-10-CM | POA: Diagnosis present

## 2020-02-05 DIAGNOSIS — Z87891 Personal history of nicotine dependence: Secondary | ICD-10-CM | POA: Diagnosis not present

## 2020-02-05 DIAGNOSIS — Z7982 Long term (current) use of aspirin: Secondary | ICD-10-CM | POA: Diagnosis not present

## 2020-02-05 DIAGNOSIS — R1084 Generalized abdominal pain: Secondary | ICD-10-CM | POA: Diagnosis present

## 2020-02-05 DIAGNOSIS — Z20822 Contact with and (suspected) exposure to covid-19: Secondary | ICD-10-CM | POA: Diagnosis present

## 2020-02-05 LAB — BASIC METABOLIC PANEL
Anion gap: 9 (ref 5–15)
BUN: 9 mg/dL (ref 8–23)
CO2: 34 mmol/L — ABNORMAL HIGH (ref 22–32)
Calcium: 8.9 mg/dL (ref 8.9–10.3)
Chloride: 94 mmol/L — ABNORMAL LOW (ref 98–111)
Creatinine, Ser: 0.58 mg/dL (ref 0.44–1.00)
GFR calc Af Amer: >60 mL/min (ref >60)
GFR calc non Af Amer: >60 mL/min (ref >60)
Glucose, Bld: 87 mg/dL (ref 70–99)
Potassium: 3.9 mmol/L (ref 3.5–5.1)
Sodium: 137 mmol/L (ref 135–145)

## 2020-02-05 LAB — CBC
HCT: 33.8 % — ABNORMAL LOW (ref 36.0–46.0)
Hemoglobin: 10.2 g/dL — ABNORMAL LOW (ref 12.0–15.0)
MCH: 29.5 pg (ref 26.0–34.0)
MCHC: 30.2 g/dL (ref 30.0–36.0)
MCV: 97.7 fL (ref 80.0–100.0)
Platelets: 235 10*3/uL (ref 150–400)
RBC: 3.46 MIL/uL — ABNORMAL LOW (ref 3.87–5.11)
RDW: 14.6 % (ref 11.5–15.5)
WBC: 4.2 10*3/uL (ref 4.0–10.5)
nRBC: 0 % (ref 0.0–0.2)

## 2020-02-05 LAB — GLUCOSE, CAPILLARY
Glucose-Capillary: 122 mg/dL — ABNORMAL HIGH (ref 70–99)
Glucose-Capillary: 156 mg/dL — ABNORMAL HIGH (ref 70–99)
Glucose-Capillary: 94 mg/dL (ref 70–99)
Glucose-Capillary: 98 mg/dL (ref 70–99)

## 2020-02-05 MED ORDER — LABETALOL HCL 5 MG/ML IV SOLN: 10.0000 mg | INTRAVENOUS | Status: DC | PRN

## 2020-02-05 MED ORDER — BISACODYL 10 MG RE SUPP
10.0000 mg | Freq: Once | RECTAL | Status: DC
  Filled 2020-02-05: qty 1

## 2020-02-05 NOTE — Progress Notes (Addendum)
Patient Demographics:    Sharon Jordan, is a 74 y.o. female, DOB - 1946/01/19, MWN:[id number]  Admit date - 02/04/2020   Admitting Physician Sharon Minch Mariea Clonts, MD  Outpatient Primary MD for the patient is Sharon Linsey, MD  LOS - 0   Chief Complaint  Patient presents with  . Abdominal Pain        Subjective:    Sharon Jordan today has no fevers, no emesis,  No chest pain,   -Abdominal pain and nausea persist,  unable to tolerate significant amount of oral intake at this time -Still requiring IV fluids  Assessment  & Plan :    Active Problems:   Abdominal pain   Brief Summary:- 74 y.o. 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  Internal hernia/obstructive finding on CT abdomen the pelvis General surgery recommends observation and serial clinical exams to rule out possible obstructive process/internal hernia -unable to tolerate significant amount of oral intake at this time -Okay to try full liquid diet, continue pain control -Await further input from general surgeon  2-Intertrochanteric fracture of left femur (HCC) -No signs of acute disalignment or fracture -Chronic changes associated with previous surgery. -Continue as needed analgesics -PT evaluation will be requested.  3-Hypertension -Stable resume home Rx  4-chronic hypoxic respiratory failure due toCOPD (chronic obstructive pulmonary disease) (HCC) -Stable, continue bronchodilators and supplemental oxygen. -Appears to be at baseline at this time  5-history of depression/anxiety -Most recent ideation  hallucination -Continue as needed anxiolytics.  6-RLS (restless legs syndrome) -Continue the use of Requip  7-history of osteoporosis -Continue Fosamax after discharge.   Disposition/Need for in-Hospital Stay- patient unable to be discharged at this time due to --need to rule out internal hernia or obstructive bowel, unable to discharge until tolerating oral intake-unable to tolerate significant amount of oral intake at this time, needs pain control, -Still requiring IV fluids  Dispo: The patient is from: Home  Anticipated d/c is to: Home  Anticipated d/c date is: 1 day  Patient currently is not medically stable to d/c. --Still requiring IV fluids  Code Status : full  Family Communication:   (patient is alert, awake and coherent)   Consults  :  Gen surgery  DVT Prophylaxis  :    - Heparin - SCDs    Lab Results  Component Value Date   PLT 235 02/05/2020    Inpatient Medications  Scheduled Meds: . aspirin EC  81 mg Oral Q breakfast  . bisacodyl  10 mg Rectal Once  . buPROPion  300 mg Oral Daily  . calcium carbonate  1 tablet Oral TID WC  . cholecalciferol  5,000 Units Oral Daily  . DULoxetine  60 mg Oral Daily  . feeding supplement (ENSURE ENLIVE)  237 mL Oral BID BM  . fluticasone furoate-vilanterol  1 puff Inhalation Daily   And  . umeclidinium bromide  1 puff Inhalation Daily  . gabapentin  100 mg Oral BID  . heparin  5,000 Units Subcutaneous Q8H  . linaclotide  145  mcg Oral QAC breakfast  . pantoprazole  40 mg Oral Daily  . rOPINIRole  0.25 mg Oral QHS  . sodium chloride flush  3 mL Intravenous Q12H   Continuous Infusions: . sodium chloride    . dextrose 5 % and 0.45% NaCl 50 mL/hr at 02/04/20 1708   PRN Meds:.sodium chloride, acetaminophen **OR** acetaminophen, albuterol, fentaNYL (SUBLIMAZE) injection, guaiFENesin, hyoscyamine, magnesium hydroxide, ondansetron **OR** ondansetron (ZOFRAN) IV, oxyCODONE, polyethylene  glycol, sodium chloride flush, traZODone    Anti-infectives (From admission, onward)   None        Objective:   Vitals:   02/04/20 1600 02/04/20 1957 02/05/20 0519 02/05/20 0816  BP: (!) 153/89 (!) 158/88 (!) 155/72   Pulse: (!) 101 94 79   Resp: 16 20 20    Temp: 98.3 F (36.8 C) 98.6 F (37 C) 97.7 F (36.5 C)   TempSrc: Oral Oral Oral   SpO2: 100% 100% 100% 98%  Weight:      Height:        Wt Readings from Last 3 Encounters:  02/04/20 53.1 kg  01/20/20 53.8 kg  01/06/20 59 kg     Intake/Output Summary (Last 24 hours) at 02/05/2020 1120 Last data filed at 02/05/2020 1043 Gross per 24 hour  Intake 600.41 ml  Output 1500 ml  Net -899.59 ml   Physical Exam  Gen:- Awake Alert,  In no apparent distress  HEENT:- Sharon Jordan.AT, No sclera icterus Neck-Supple Neck,No JVD,.  Lungs-  CTAB , fair symmetrical air movement CV- S1, S2 normal, regular  Abd-  +ve B.Sounds, Abd Soft, generalized abdominal tenderness worse in the lower quadrants, no definite rebound or guarding Extremity/Skin:- No  edema, pedal pulses present  Psych-affect is appropriate, oriented x3 Neuro-generalized weakness, no new focal deficits, no tremors   Data Review:   Micro Results Recent Results (from the past 240 hour(s))  SARS Coronavirus 2 by RT PCR (hospital order, performed in [facility] hospital lab) Nasopharyngeal Nasopharyngeal Swab     Status: None   Collection Time: 02/04/20 11:16 AM   Specimen: Nasopharyngeal Swab  Result Value Ref Range Status   SARS Coronavirus 2 NEGATIVE NEGATIVE Final    Comment: (NOTE) SARS-CoV-2 target nucleic acids are NOT DETECTED.  The SARS-CoV-2 RNA is generally detectable in upper and lower respiratory specimens during the acute phase of infection. The lowest concentration of SARS-CoV-2 viral copies this assay can detect is 250 copies / mL. A negative result does not preclude SARS-CoV-2 infection and should not be used as the sole basis for treatment or  other patient management decisions.  A negative result may occur with improper specimen collection / handling, submission of specimen other than nasopharyngeal swab, presence of viral mutation(s) within the areas targeted by this assay, and inadequate number of viral copies (<250 copies / mL). A negative result must be combined with clinical observations, patient history, and epidemiological information.  Fact Sheet for Patients:   04/06/20  Fact Sheet for Healthcare Providers: BoilerBrush.com.cy  This test is not yet approved or  cleared by the https://pope.com/ FDA and has been authorized for detection and/or diagnosis of SARS-CoV-2 by FDA under an Emergency Use Authorization (EUA).  This EUA will remain in effect (meaning this test can be used) for the duration of the COVID-19 declaration under Section 564(b)(1) of the Act, 21 U.S.C. section 360bbb-3(b)(1), unless the authorization is terminated or revoked sooner.  Performed at [facility], [address], [place], [place]     Radiology Reports  CT ABDOMEN PELVIS W CONTRAST  Result Date: 02/04/2020 CLINICAL DATA:  74 year old presenting with acute onset of generalized abdominal pain. Current history of lupus. Surgical history includes appendectomy and hysterectomy. EXAM: CT ABDOMEN AND PELVIS WITH CONTRAST TECHNIQUE: Multidetector CT imaging of the abdomen and pelvis was performed using the standard protocol following bolus administration of intravenous contrast. CONTRAST:  OMNIPAQUE IOHEXOL 300 MG/ML IV. COMPARISON:  01/20/2020, 12/26/2019. FINDINGS: Lower chest: Heart size upper normal to slightly enlarged, unchanged. Mitral annular calcification. Visualized lung bases clear. Hepatobiliary: Liver normal in size and appearance. Surgically absent gallbladder. No unexpected biliary ductal dilation. Pancreas: Atrophic without evidence of mass or peripancreatic  inflammation. Spleen: Normal in size and appearance. Adrenals/Urinary Tract: Normal appearing adrenal glands. Non-obstructing LEFT mid renal calculus measuring approximately 3 mm, unchanged. No urinary tract calculi elsewhere. No hydronephrosis involving either kidney. No focal parenchymal abnormalities involving either kidney. Irregular renal contours are likely related to persistent fetal lobulations, a developmental variant. Normal appearing urinary bladder. Stomach/Bowel: Moderately large hiatal hernia, unchanged. Stomach otherwise unremarkable for degree of distension. Likely duodenal diverticulum arising from the descending duodenum. Small bowel otherwise unremarkable. Normal-appearing small bowel loops are present LATERAL to the descending colon in the LEFT UPPER QUADRANT, possibly indicating an internal hernia. Moderate stool burden throughout the colon. Descending and sigmoid colon diverticulosis without evidence of acute diverticulitis. Surgically absent appendix. Vascular/Lymphatic: Moderate aorto-iliofemoral atherosclerosis without evidence of aneurysm. Normal-appearing portal venous and systemic venous systems. No pathologic lymphadenopathy. Reproductive: Surgically absent uterus.  No adnexal masses. Other: Phleboliths in both sides of the pelvis. Musculoskeletal: Prior ORIF of a LEFT femoral neck fracture with healing. Osseous demineralization. Remote compression fracture of L1, unchanged. Diffuse degenerative disc disease and spondylosis throughout the lower thoracic and lumbar spine. Severe diffuse facet degenerative changes throughout the lumbar spine. Moderate to severe multifactorial spinal stenosis at L2-3, L3-4 and L4-5. No acute findings. IMPRESSION: 1. No acute abnormalities involving the abdomen or pelvis. 2. Normal-appearing small bowel loops LATERAL to the descending colon in the LEFT UPPER QUADRANT, possibly indicating an internal hernia. No evidence of small bowel obstruction. 3.  Descending and sigmoid colon diverticulosis without evidence of acute diverticulitis. 4. Moderately large hiatal hernia, unchanged. 5. Non-obstructing LEFT mid renal calculus measuring approximately 3 mm. 6. Moderate to severe multifactorial spinal stenosis at L2-3, L3-4 and L4-5. Aortic Atherosclerosis (ICD10-I70.0). Electronically Signed   By: Hulan Saas M.D.   On: 02/04/2020 09:55     CBC Recent Labs  Lab 02/04/20 0759 02/05/20 0347  WBC 6.7 4.2  HGB 11.6* 10.2*  HCT 37.6 33.8*  PLT 276 235  MCV 95.7 97.7  MCH 29.5 29.5  MCHC 30.9 30.2  RDW 14.7 14.6  LYMPHSABS 0.9  --   MONOABS 0.6  --   EOSABS 0.2  --   BASOSABS 0.0  --     Chemistries  Recent Labs  Lab 02/04/20 0759 02/05/20 0347  NA 136 137  K 3.8 3.9  CL 91* 94*  CO2 36* 34*  GLUCOSE 99 87  BUN 15 9  CREATININE 0.72 0.58  CALCIUM 9.3 8.9  AST 13*  --   ALT 11  --   ALKPHOS 66  --   BILITOT 0.7  --    ------------------------------------------------------------------------------------------------------------------ No results for input(s): CHOL, HDL, LDLCALC, TRIG, CHOLHDL, LDLDIRECT in the last 72 hours.  Lab Results  Component Value Date   HGBA1C 5.3 12/26/2019   ------------------------------------------------------------------------------------------------------------------ No results for input(s): TSH, T4TOTAL, T3FREE, THYROIDAB in the last 72  hours.  Invalid input(s): FREET3 ------------------------------------------------------------------------------------------------------------------ No results for input(s): VITAMINB12, FOLATE, FERRITIN, TIBC, IRON, RETICCTPCT in the last 72 hours.  Coagulation profile No results for input(s): INR, PROTIME in the last 168 hours.  No results for input(s): DDIMER in the last 72 hours.  Cardiac Enzymes No results for input(s): CKMB, TROPONINI, MYOGLOBIN in the last 168 hours.  Invalid input(s):  CK ------------------------------------------------------------------------------------------------------------------    Component Value Date/Time   BNP 91.0 12/26/2019 0918     Shon Haleourage Eliya Bubar M.D on 02/05/2020 at 11:20 AM  Go to [email or web address] - for contact info  [facility] - Office  [phone]

## 2020-02-05 NOTE — Progress Notes (Signed)
[facility] Progress Note     Subjective: Still with pain. Vomited up ensure but also had large diarrhea.   Objective: Vital signs in last 24 hours: Temp:  [97.7 F (36.5 C)-98.6 F (37 C)] 97.7 F (36.5 C) (07/08 0519) Pulse Rate:  [79-101] 79 (07/08 0519) Resp:  [16-20] 20 (07/08 0519) BP: (153-158)/(72-89) 155/72 (07/08 0519) SpO2:  [98 %-100 %] 98 % (07/08 0816) Last BM Date: 02/04/20  Intake/Output from previous day: 07/07 0701 - 07/08 0700 In: 600.4 [I.V.:600.4] Out: 500 [Urine:500] Intake/Output this shift: Total I/O In: -  Out: 1001 [Urine:1000; Stool:1]  General appearance: alert, cooperative and no distress Resp: normal work of breathing, O2 in nares GI: soft, nondistended, epigastric and Left sided tenderness, no rebound or guarding  Lab Results:  Recent Labs    02/04/20 0759 02/05/20 0347  WBC 6.7 4.2  HGB 11.6* 10.2*  HCT 37.6 33.8*  PLT 276 235   BMET Recent Labs    02/04/20 0759 02/05/20 0347  NA 136 137  K 3.8 3.9  CL 91* 94*  CO2 36* 34*  GLUCOSE 99 87  BUN 15 9  CREATININE 0.72 0.58  CALCIUM 9.3 8.9   PT/INR No results for input(s): LABPROT, INR in the last 72 hours.  Studies/Results: CT ABDOMEN PELVIS W CONTRAST  Result Date: 02/04/2020 CLINICAL DATA:  74 year old presenting with acute onset of generalized abdominal pain. Current history of lupus. Surgical history includes appendectomy and hysterectomy. EXAM: CT ABDOMEN AND PELVIS WITH CONTRAST TECHNIQUE: Multidetector CT imaging of the abdomen and pelvis was performed using the standard protocol following bolus administration of intravenous contrast. CONTRAST:  OMNIPAQUE IOHEXOL 300 MG/ML IV. COMPARISON:  01/20/2020, 12/26/2019. FINDINGS: Lower chest: Heart size upper normal to slightly enlarged, unchanged. Mitral annular calcification. Visualized lung bases clear. Hepatobiliary: Liver normal in size and appearance. Surgically absent gallbladder. No unexpected  biliary ductal dilation. Pancreas: Atrophic without evidence of mass or peripancreatic inflammation. Spleen: Normal in size and appearance. Adrenals/Urinary Tract: Normal appearing adrenal glands. Non-obstructing LEFT mid renal calculus measuring approximately 3 mm, unchanged. No urinary tract calculi elsewhere. No hydronephrosis involving either kidney. No focal parenchymal abnormalities involving either kidney. Irregular renal contours are likely related to persistent fetal lobulations, a developmental variant. Normal appearing urinary bladder. Stomach/Bowel: Moderately large hiatal hernia, unchanged. Stomach otherwise unremarkable for degree of distension. Likely duodenal diverticulum arising from the descending duodenum. Small bowel otherwise unremarkable. Normal-appearing small bowel loops are present LATERAL to the descending colon in the LEFT UPPER QUADRANT, possibly indicating an internal hernia. Moderate stool burden throughout the colon. Descending and sigmoid colon diverticulosis without evidence of acute diverticulitis. Surgically absent appendix. Vascular/Lymphatic: Moderate aorto-iliofemoral atherosclerosis without evidence of aneurysm. Normal-appearing portal venous and systemic venous systems. No pathologic lymphadenopathy. Reproductive: Surgically absent uterus.  No adnexal masses. Other: Phleboliths in both sides of the pelvis. Musculoskeletal: Prior ORIF of a LEFT femoral neck fracture with healing. Osseous demineralization. Remote compression fracture of L1, unchanged. Diffuse degenerative disc disease and spondylosis throughout the lower thoracic and lumbar spine. Severe diffuse facet degenerative changes throughout the lumbar spine. Moderate to severe multifactorial spinal stenosis at L2-3, L3-4 and L4-5. No acute findings. IMPRESSION: 1. No acute abnormalities involving the abdomen or pelvis. 2. Normal-appearing small bowel loops LATERAL to the descending colon in the LEFT UPPER QUADRANT,  possibly indicating an internal hernia. No evidence of small bowel obstruction. 3. Descending and sigmoid colon diverticulosis without evidence of acute diverticulitis. 4. Moderately large hiatal hernia, unchanged. 5.  Non-obstructing LEFT mid renal calculus measuring approximately 3 mm. 6. Moderate to severe multifactorial spinal stenosis at L2-3, L3-4 and L4-5. Aortic Atherosclerosis (ICD10-I70.0). Electronically Signed   By: Hulan Saas M.D.   On: 02/04/2020 09:55    Anti-infectives: Anti-infectives (From admission, onward)   None      Assessment/Plan: Sharon Jordan is a 74 yo with epigastric/ left sided pain and recent gastroenteritis and Ct with question of internal hernia but not showing any signs of obstruction and not a definitive internal hernia on the CT after review with radiology.  -Diet as tolerated -Discussed that this could be residual issues from gastroenteritis?  -Discussed possible need for exploration if no improvement but do not want to rush into this in a patient with O2 requirements, lupus, recent SNF admission, etc  -No acute indication for surgery, abdomen soft, not obstructed with Bms, and labs reassuring   Discussed with Dr. Mariea Clonts.   LOS: 0 days    Sharon Jordan 02/05/2020

## 2020-02-05 NOTE — Plan of Care (Signed)
  Problem: Education: Goal: Knowledge of General Education information will improve Description Including pain rating scale, medication(s)/side effects and non-pharmacologic comfort measures Outcome: Progressing   Problem: Health Behavior/Discharge Planning: Goal: Ability to manage health-related needs will improve Outcome: Progressing   

## 2020-02-06 LAB — GLUCOSE, CAPILLARY
Glucose-Capillary: 110 mg/dL — ABNORMAL HIGH (ref 70–99)
Glucose-Capillary: 111 mg/dL — ABNORMAL HIGH (ref 70–99)
Glucose-Capillary: 113 mg/dL — ABNORMAL HIGH (ref 70–99)
Glucose-Capillary: 145 mg/dL — ABNORMAL HIGH (ref 70–99)

## 2020-02-06 MED ORDER — FENTANYL CITRATE (PF) 100 MCG/2ML IJ SOLN: 12.5000 ug | INTRAMUSCULAR | Status: DC | PRN

## 2020-02-06 NOTE — TOC Progression Note (Signed)
Transition of Care [facility]) - Progression Note    Patient Details  Name: Sharon Jordan MRN: [id number] Date of Birth: 06-05-1946  [facility] CM/SW Contact  Annice Needy, LCSW Phone Number: 02/06/2020, 10:44 AM  Clinical Narrative:    Patient request through RN psych assistance for Depression. Spoke with patient regarding needs. Patient is agreeable to go to [facility]. Virtual appointment made for Monday, 02/09/20 @ 2:00 p.m. Patient advised of appointment and patient appointment added to AVS.          Expected Discharge Plan and Services                                                 Social Determinants of Health (SDOH) Interventions    Readmission Risk Interventions Readmission Risk Prevention Plan 10/28/2019  Transportation Screening Complete  Home Care Screening Complete  Medication Review (RN CM) Complete

## 2020-02-06 NOTE — Progress Notes (Addendum)
I was present with the medical student for this service. I personally verified the history of present illness, performed the physical exam, and made the plan for this encounter. I have verified the medical student's documentation and made modifications where appropriately. I have personally documented in my own words a brief history, physical, and plan below.     Improving with pain. Tolerated diet today. Had BM yesterday.   BP 126/64 (BP Location: Left Arm)   Pulse 87   Temp 98.2 F (36.8 C) (Oral)   Resp 18   Ht 5' (1.524 m)   Wt 53.1 kg   SpO2 98%   BMI 22.85 kg/m   Nondistended, tender in the lower abdomen but improving  Patient with overall improvement. CT with concern for possible internal hernia but not clinically obstructed and not definitive on the review with radiology. Do not want to rush to surgery as patient is frail and has been in SNF.  No acute surgical intervention right now. Hopefully home in next 24 hrs if continues improvement. If returns or has continued issues may need to repeat CT and or do exploration to rule out internal hernia.  [name], MD [facility] [address] [name] [place], [place] [address] [phone] (office)    Subjective: Abdominal pain is much improved this yesterday. Diet increased to solid foods and able to keep food down for the first time since last night. Can't remember last bowel movement. Back pain has not improved or worsened. Excited about getting out of bed and walking around this afternoon.  Objective: Vital signs in last 24 hours: Temp:  [97.9 F (36.6 C)-98.5 F (36.9 C)] 98.2 F (36.8 C) (07/09 0547) Pulse Rate:  [87-99] 87 (07/09 0547) Resp:  [18] 18 (07/09 0547) BP: (110-127)/(58-64) 126/64 (07/09 0547) SpO2:  [88 %-100 %] 98 % (07/09 0736) Last BM Date: 02/04/20  Intake/Output from previous day: 07/08 0701 - 07/09 0700 In: 566.2 [P.O.:120; I.V.:446.2] Out: 1001 [Urine:1000;  Stool:1] Intake/Output this shift: No intake/output data recorded.  General appearance: alert, cooperative and no distress Resp: clear to auscultation bilaterally Cardio: S1, S2 normal GI: abnormal findings:  moderate tenderness in the epigastrium, in the LLQ, in the left flank and in the right flank Extremities: TTP left shin  Lab Results:  Recent Labs    02/04/20 0759 02/05/20 0347  WBC 6.7 4.2  HGB 11.6* 10.2*  HCT 37.6 33.8*  PLT 276 235   BMET Recent Labs    02/04/20 0759 02/05/20 0347  NA 136 137  K 3.8 3.9  CL 91* 94*  CO2 36* 34*  GLUCOSE 99 87  BUN 15 9  CREATININE 0.72 0.58  CALCIUM 9.3 8.9   PT/INR No results for input(s): LABPROT, INR in the last 72 hours.  Studies/Results: CT ABDOMEN PELVIS W CONTRAST  Result Date: 02/04/2020 CLINICAL DATA:  74 year old presenting with acute onset of generalized abdominal pain. Current history of lupus. Surgical history includes appendectomy and hysterectomy. EXAM: CT ABDOMEN AND PELVIS WITH CONTRAST TECHNIQUE: Multidetector CT imaging of the abdomen and pelvis was performed using the standard protocol following bolus administration of intravenous contrast. CONTRAST:  OMNIPAQUE IOHEXOL 300 MG/ML IV. COMPARISON:  01/20/2020, 12/26/2019. FINDINGS: Lower chest: Heart size upper normal to slightly enlarged, unchanged. Mitral annular calcification. Visualized lung bases clear. Hepatobiliary: Liver normal in size and appearance. Surgically absent gallbladder. No unexpected biliary ductal dilation. Pancreas: Atrophic without evidence of mass or peripancreatic inflammation. Spleen: Normal in size and appearance. Adrenals/Urinary Tract:  Normal appearing adrenal glands. Non-obstructing LEFT mid renal calculus measuring approximately 3 mm, unchanged. No urinary tract calculi elsewhere. No hydronephrosis involving either kidney. No focal parenchymal abnormalities involving either kidney. Irregular renal contours are likely related to  persistent fetal lobulations, a developmental variant. Normal appearing urinary bladder. Stomach/Bowel: Moderately large hiatal hernia, unchanged. Stomach otherwise unremarkable for degree of distension. Likely duodenal diverticulum arising from the descending duodenum. Small bowel otherwise unremarkable. Normal-appearing small bowel loops are present LATERAL to the descending colon in the LEFT UPPER QUADRANT, possibly indicating an internal hernia. Moderate stool burden throughout the colon. Descending and sigmoid colon diverticulosis without evidence of acute diverticulitis. Surgically absent appendix. Vascular/Lymphatic: Moderate aorto-iliofemoral atherosclerosis without evidence of aneurysm. Normal-appearing portal venous and systemic venous systems. No pathologic lymphadenopathy. Reproductive: Surgically absent uterus.  No adnexal masses. Other: Phleboliths in both sides of the pelvis. Musculoskeletal: Prior ORIF of a LEFT femoral neck fracture with healing. Osseous demineralization. Remote compression fracture of L1, unchanged. Diffuse degenerative disc disease and spondylosis throughout the lower thoracic and lumbar spine. Severe diffuse facet degenerative changes throughout the lumbar spine. Moderate to severe multifactorial spinal stenosis at L2-3, L3-4 and L4-5. No acute findings. IMPRESSION: 1. No acute abnormalities involving the abdomen or pelvis. 2. Normal-appearing small bowel loops LATERAL to the descending colon in the LEFT UPPER QUADRANT, possibly indicating an internal hernia. No evidence of small bowel obstruction. 3. Descending and sigmoid colon diverticulosis without evidence of acute diverticulitis. 4. Moderately large hiatal hernia, unchanged. 5. Non-obstructing LEFT mid renal calculus measuring approximately 3 mm. 6. Moderate to severe multifactorial spinal stenosis at L2-3, L3-4 and L4-5. Aortic Atherosclerosis (ICD10-I70.0). Electronically Signed   By: Hulan Saas M.D.   On:  02/04/2020 09:55    Anti-infectives: Anti-infectives (From admission, onward)   None      Assessment/Plan: Ms. Koman's abdominal pain is much improved since yesterday and is tolerating solid and liquids. She continues to have significant bilateral flank pain and is tender to palpation in the epigastrium and LLQ on physical exam. Current symptoms are concerning for possible UTI or pyelonephritis, urinalysis from two days ago was negative for nitrates or LE, and patient currently has a catheter. She still does not have an indication for surgery.  - Continue diet as tolerated  -CBC, CMP Labs pending    -  LOS: 1 day    Doylene Canard, Medical Student  02/06/2020

## 2020-02-06 NOTE — Progress Notes (Signed)
Patient Demographics:    Sharon Jordan, is a 74 y.o. female, DOB - 1946/02/05, YHC:[id number]  Admit date - 02/04/2020   Admitting Physician Conchita Truxillo Mariea Clonts, MD  Outpatient Primary MD for the patient is Sharon Linsey, MD  LOS - 1   Chief Complaint  Patient presents with  . Abdominal Pain        Subjective:    [name] today has no fevers, no emesis,  No chest pain,    -Tolerating oral intake better, having BMs, abdominal pain or nausea has improved -Pt refused to leave today, says Family is OOT--- I gave patient a verbal  24hr Medicare notice of intention to discharge -patient will have to leave 7/10//2021    Assessment  & Plan :    Active Problems:   Abdominal pain   Brief Summary:- 74 y.o. 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  Internal hernia/obstructive finding on CT abdomen the pelvis General surgery recommended observation and serial clinical exams to rule out possible obstructive process/internal hernia -Tolerating oral intake better, having BMs, abdominal pain or nausea has improved -Clinically obstructive process/internal hernia deemed to be less likely  2-Intertrochanteric fracture of left femur (HCC) -No signs of acute disalignment or fracture -Chronic changes associated with previous surgery. -Continue as needed analgesics -PT eval requested-anticipate discharge home with home health physical therapy  3- history of depression/anxiety--stable, continue Wellbutrin and Cymbalta -Continue as needed anxiolytics.  4)RLS (restless legs  syndrome) -Continue the use of Requip   5)-chronic hypoxic respiratory failure due toCOPD (chronic obstructive pulmonary disease) (HCC) -Stable, continue bronchodilators and supplemental oxygen. -Appears to be at baseline at this time  6)-history of osteoporosis -Continue Fosamax after discharge.  7)Generalized weakness--recent hospitalization, recently discharged from SNF rehab, anticipate discharge home with home health physical therapy   Disposition/Need for in-Hospital Stay- patient unable to be discharged at this time due to ---Tolerating oral intake better, having BMs, abdominal pain or nausea has improved -Pt refused to leave today, says Family is OOT--- I gave patient a verbal  24hr Medicare notice of intention to discharge -patient will have to leave 7/10//2021   Dispo: The patient is from: Home  Anticipated d/c is to: Home  Anticipated d/c date is: 1 day  Patient currently is   medically stable to d/c. -Tolerating oral intake better, having BMs, abdominal pain or nausea has improved -Pt refused to leave today, says Family is OOT--- I gave patient a verbal  24hr Medicare notice of intention to discharge -patient will have to leave 7/10//2021   Code Status : full  Family Communication:   (patient is alert, awake and coherent)   Consults  :  Gen surgery  DVT Prophylaxis  :    - Heparin - SCDs    Lab Results  Component Value Date   PLT 235 02/05/2020    Inpatient Medications  Scheduled Meds: . aspirin EC  81 mg Oral Q breakfast  . bisacodyl  10 mg Rectal Once  . buPROPion  300 mg Oral  Daily  . calcium carbonate  1 tablet Oral TID WC  . cholecalciferol  5,000 Units Oral Daily  . DULoxetine  60 mg Oral Daily  . feeding supplement (ENSURE ENLIVE)  237 mL Oral BID BM  . fluticasone furoate-vilanterol  1 puff Inhalation Daily   And  . umeclidinium bromide  1 puff Inhalation Daily  . gabapentin  100 mg Oral BID  . heparin   5,000 Units Subcutaneous Q8H  . linaclotide  145 mcg Oral QAC breakfast  . pantoprazole  40 mg Oral Daily  . rOPINIRole  0.25 mg Oral QHS  . sodium chloride flush  3 mL Intravenous Q12H   Continuous Infusions: . sodium chloride    . dextrose 5 % and 0.45% NaCl 40 mL/hr at 02/06/20 1847   PRN Meds:.sodium chloride, acetaminophen **OR** acetaminophen, albuterol, fentaNYL (SUBLIMAZE) injection, guaiFENesin, hyoscyamine, labetalol, magnesium hydroxide, ondansetron **OR** ondansetron (ZOFRAN) IV, oxyCODONE, polyethylene glycol, sodium chloride flush, traZODone    Anti-infectives (From admission, onward)   None        Objective:   Vitals:   02/06/20 0547 02/06/20 0736 02/06/20 1542 02/06/20 1933  BP: 126/64  (!) 108/55   Pulse: 87  (!) 102   Resp: 18  20   Temp: 98.2 F (36.8 C)  (!) 97 F (36.1 C)   TempSrc: Oral  Oral   SpO2: 100% 98% 95% 90%  Weight:      Height:        Wt Readings from Last 3 Encounters:  02/04/20 53.1 kg  01/20/20 53.8 kg  01/06/20 59 kg    No intake or output data in the 24 hours ending 02/06/20 2021 Physical Exam  Gen:- Awake Alert,  In no apparent distress  HEENT:- .AT, No sclera icterus Neck-Supple Neck,No JVD,.  Lungs-  CTAB , fair symmetrical air movement CV- S1, S2 normal, regular  Abd-  +ve B.Sounds, Abd Soft, much improved abdominal tenderness worse in the lower quadrants, no definite rebound or guarding Extremity/Skin:- No  edema, pedal pulses present  Psych-affect is appropriate, oriented x3 Neuro-generalized weakness, no new focal deficits, no tremors   Data Review:   Micro Results Recent Results (from the past 240 hour(s))  SARS Coronavirus 2 by RT PCR (hospital order, [facility]) Nasopharyngeal Nasopharyngeal Swab     Status: None   Collection Time: 02/04/20 11:16 AM   Specimen: Nasopharyngeal Swab  Result Value Ref Range Status   SARS Coronavirus 2 NEGATIVE NEGATIVE Final    Comment:  (NOTE) SARS-CoV-2 target nucleic acids are NOT DETECTED.  The SARS-CoV-2 RNA is generally detectable in upper and lower respiratory specimens during the acute phase of infection. The lowest concentration of SARS-CoV-2 viral copies this assay can detect is 250 copies / mL. A negative result does not preclude SARS-CoV-2 infection and should not be used as the sole basis for treatment or other patient management decisions.  A negative result may occur with improper specimen collection / handling, submission of specimen other than nasopharyngeal swab, presence of viral mutation(s) within the areas targeted by this assay, and inadequate number of viral copies (<250 copies / mL). A negative result must be combined with clinical observations, patient history, and epidemiological information.  Fact Sheet for Patients:   BoilerBrush.com.cyhttps://www.fda.gov/media/136312/download  Fact Sheet for Healthcare Providers: https://pope.com/https://www.fda.gov/media/136313/download  This test is not yet approved or  cleared by the [place] FDA and has been authorized for detection and/or diagnosis of SARS-CoV-2 by FDA under an Emergency Use  Authorization (EUA).  This EUA will remain in effect (meaning this test can be used) for the duration of the COVID-19 declaration under Section 564(b)(1) of the Act, 21 U.S.C. section 360bbb-3(b)(1), unless the authorization is terminated or revoked sooner.  Performed at [facility], [address], [place], [place] [address]     Radiology Reports CT ABDOMEN PELVIS W CONTRAST  Result Date: 02/04/2020 CLINICAL DATA:  74 year old presenting with acute onset of generalized abdominal pain. Current history of lupus. Surgical history includes appendectomy and hysterectomy. EXAM: CT ABDOMEN AND PELVIS WITH CONTRAST TECHNIQUE: Multidetector CT imaging of the abdomen and pelvis was performed using the standard protocol following bolus administration of intravenous contrast. CONTRAST:   OMNIPAQUE IOHEXOL 300 MG/ML IV. COMPARISON:  01/20/2020, 12/26/2019. FINDINGS: Lower chest: Heart size upper normal to slightly enlarged, unchanged. Mitral annular calcification. Visualized lung bases clear. Hepatobiliary: Liver normal in size and appearance. Surgically absent gallbladder. No unexpected biliary ductal dilation. Pancreas: Atrophic without evidence of mass or peripancreatic inflammation. Spleen: Normal in size and appearance. Adrenals/Urinary Tract: Normal appearing adrenal glands. Non-obstructing LEFT mid renal calculus measuring approximately 3 mm, unchanged. No urinary tract calculi elsewhere. No hydronephrosis involving either kidney. No focal parenchymal abnormalities involving either kidney. Irregular renal contours are likely related to persistent fetal lobulations, a developmental variant. Normal appearing urinary bladder. Stomach/Bowel: Moderately large hiatal hernia, unchanged. Stomach otherwise unremarkable for degree of distension. Likely duodenal diverticulum arising from the descending duodenum. Small bowel otherwise unremarkable. Normal-appearing small bowel loops are present LATERAL to the descending colon in the LEFT UPPER QUADRANT, possibly indicating an internal hernia. Moderate stool burden throughout the colon. Descending and sigmoid colon diverticulosis without evidence of acute diverticulitis. Surgically absent appendix. Vascular/Lymphatic: Moderate aorto-iliofemoral atherosclerosis without evidence of aneurysm. Normal-appearing portal venous and systemic venous systems. No pathologic lymphadenopathy. Reproductive: Surgically absent uterus.  No adnexal masses. Other: Phleboliths in both sides of the pelvis. Musculoskeletal: Prior ORIF of a LEFT femoral neck fracture with healing. Osseous demineralization. Remote compression fracture of L1, unchanged. Diffuse degenerative disc disease and spondylosis throughout the lower thoracic and lumbar spine. Severe diffuse facet  degenerative changes throughout the lumbar spine. Moderate to severe multifactorial spinal stenosis at L2-3, L3-4 and L4-5. No acute findings. IMPRESSION: 1. No acute abnormalities involving the abdomen or pelvis. 2. Normal-appearing small bowel loops LATERAL to the descending colon in the LEFT UPPER QUADRANT, possibly indicating an internal hernia. No evidence of small bowel obstruction. 3. Descending and sigmoid colon diverticulosis without evidence of acute diverticulitis. 4. Moderately large hiatal hernia, unchanged. 5. Non-obstructing LEFT mid renal calculus measuring approximately 3 mm. 6. Moderate to severe multifactorial spinal stenosis at L2-3, L3-4 and L4-5. Aortic Atherosclerosis (ICD10-I70.0). Electronically Signed   By: Hulan Saas M.D.   On: 02/04/2020 09:55     CBC Recent Labs  Lab 02/04/20 0759 02/05/20 0347  WBC 6.7 4.2  HGB 11.6* 10.2*  HCT 37.6 33.8*  PLT 276 235  MCV 95.7 97.7  MCH 29.5 29.5  MCHC 30.9 30.2  RDW 14.7 14.6  LYMPHSABS 0.9  --   MONOABS 0.6  --   EOSABS 0.2  --   BASOSABS 0.0  --     Chemistries  Recent Labs  Lab 02/04/20 0759 02/05/20 0347  NA 136 137  K 3.8 3.9  CL 91* 94*  CO2 36* 34*  GLUCOSE 99 87  BUN 15 9  CREATININE 0.72 0.58  CALCIUM 9.3 8.9  AST 13*  --   ALT 11  --  ALKPHOS 66  --   BILITOT 0.7  --    ------------------------------------------------------------------------------------------------------------------ No results for input(s): CHOL, HDL, LDLCALC, TRIG, CHOLHDL, LDLDIRECT in the last 72 hours.  Lab Results  Component Value Date   HGBA1C 5.3 12/26/2019   ------------------------------------------------------------------------------------------------------------------ No results for input(s): TSH, T4TOTAL, T3FREE, THYROIDAB in the last 72 hours.  Invalid input(s): FREET3 ------------------------------------------------------------------------------------------------------------------ No results for  input(s): VITAMINB12, FOLATE, FERRITIN, TIBC, IRON, RETICCTPCT in the last 72 hours.  Coagulation profile No results for input(s): INR, PROTIME in the last 168 hours.  No results for input(s): DDIMER in the last 72 hours.  Cardiac Enzymes No results for input(s): CKMB, TROPONINI, MYOGLOBIN in the last 168 hours.  Invalid input(s): CK ------------------------------------------------------------------------------------------------------------------    Component Value Date/Time   BNP 91.0 12/26/2019 0918     Shon Hale M.D on 02/06/2020 at 8:21 PM  Go to [email or web address] - for contact info  [facility] - Office  [phone]

## 2020-02-06 NOTE — Care Management Important Message (Signed)
Important Message  Patient Details  Name: Sharon Jordan MRN: [id number] Date of Birth: Mar 19, 1946   Medicare Important Message Given:  Yes     Corey Harold 02/06/2020, 12:37 PM

## 2020-02-07 DIAGNOSIS — J9611 Chronic respiratory failure with hypoxia: Secondary | ICD-10-CM

## 2020-02-07 DIAGNOSIS — G2581 Restless legs syndrome: Secondary | ICD-10-CM

## 2020-02-07 DIAGNOSIS — J449 Chronic obstructive pulmonary disease, unspecified: Secondary | ICD-10-CM

## 2020-02-07 LAB — CBC
HCT: 32.3 % — ABNORMAL LOW (ref 36.0–46.0)
Hemoglobin: 9.7 g/dL — ABNORMAL LOW (ref 12.0–15.0)
MCH: 29 pg (ref 26.0–34.0)
MCHC: 30 g/dL (ref 30.0–36.0)
MCV: 96.7 fL (ref 80.0–100.0)
Platelets: 220 10*3/uL (ref 150–400)
RBC: 3.34 MIL/uL — ABNORMAL LOW (ref 3.87–5.11)
RDW: 14.4 % (ref 11.5–15.5)
WBC: 5.4 10*3/uL (ref 4.0–10.5)
nRBC: 0 % (ref 0.0–0.2)

## 2020-02-07 LAB — GLUCOSE, CAPILLARY
Glucose-Capillary: 149 mg/dL — ABNORMAL HIGH (ref 70–99)
Glucose-Capillary: 130 mg/dL — ABNORMAL HIGH (ref 70–99)
Glucose-Capillary: 98 mg/dL (ref 70–99)

## 2020-02-07 LAB — BASIC METABOLIC PANEL
Anion gap: 8 (ref 5–15)
BUN: 8 mg/dL (ref 8–23)
CO2: 35 mmol/L — ABNORMAL HIGH (ref 22–32)
Calcium: 8.8 mg/dL — ABNORMAL LOW (ref 8.9–10.3)
Chloride: 93 mmol/L — ABNORMAL LOW (ref 98–111)
Creatinine, Ser: 0.57 mg/dL (ref 0.44–1.00)
GFR calc Af Amer: >60 mL/min (ref >60)
GFR calc non Af Amer: >60 mL/min (ref >60)
Glucose, Bld: 83 mg/dL (ref 70–99)
Potassium: 3.7 mmol/L (ref 3.5–5.1)
Sodium: 136 mmol/L (ref 135–145)

## 2020-02-07 MED ORDER — HYDROXYZINE PAMOATE 25 MG PO CAPS
25.0000 mg | ORAL_CAPSULE | Freq: Three times a day (TID) | ORAL | 0 refills | Status: DC | PRN
Start: 2020-02-07 — End: 2020-03-29

## 2020-02-07 NOTE — Progress Notes (Signed)
IV removed, 2x2 gauze and paper tape applied to site, patient tolerated well. Reviewed AVS with patient and patient's son, both verbalized understanding. Patient taken downstairs in her own wheelchair by her son. Transported home by her son.

## 2020-02-07 NOTE — Progress Notes (Signed)
[facility] Progress Note     Subjective: Improved pain. Tolerated softs. Feeling better. Having flatus. Takes metamucil at home to have BM.  Objective: Vital signs in last 24 hours: Temp:  [97 F (36.1 C)-99 F (37.2 C)] 98.5 F (36.9 C) (07/10 0426) Pulse Rate:  [90-102] 90 (07/10 0426) Resp:  [20] 20 (07/10 0426) BP: (101-139)/(52-63) 101/52 (07/10 0426) SpO2:  [90 %-100 %] 98 % (07/10 0959) Last BM Date: 02/04/20  Intake/Output from previous day: 07/09 0701 - 07/10 0700 In: -  Out: 600 [Urine:600] Intake/Output this shift: Total I/O In: 240 [P.O.:240] Out: -   General appearance: alert, cooperative and no distress GI: soft, nondistended, improved tenderness, remains tender left flank, but improving  Lab Results:  Recent Labs    02/05/20 0347 02/07/20 0621  WBC 4.2 5.4  HGB 10.2* 9.7*  HCT 33.8* 32.3*  PLT 235 220   BMET Recent Labs    02/05/20 0347 02/07/20 0621  NA 137 136  K 3.9 3.7  CL 94* 93*  CO2 34* 35*  GLUCOSE 87 83  BUN 9 8  CREATININE 0.58 0.57  CALCIUM 8.9 8.8*    A Assessment/Plan: Ms. Plazola is a 74 yo with recent gastroenteritis and return to hospital with pain and CT with ? Of internal hernia but nothing definitive and no signs of obstruction. Has improved clinically.  -Diet as tolerated -Follow with PCP -Can see me if wants/ needs to in 2-3 weeks  -If repeated admissions for similar issues or pain, may need to do exploration, patient aware but reluctant   Discussed with Dr. Kerry Hough.   LOS: 2 days    Lucretia Roers 02/07/2020

## 2020-02-07 NOTE — Discharge Summary (Signed)
Physician Discharge Summary  Sharon CleverJacqueline Maines ZOX:[id number] DOB: 05/08/1946 DOA: 02/04/2020  PCP: Oval Linseyondiego, Richard, MD  Admit date: 02/04/2020 Discharge date: 02/07/2020  Admitted From: Home Disposition: Home  Recommendations for Outpatient Follow-up:  1. Follow up with PCP in 1-2 weeks 2. Please obtain BMP/CBC in one week 3. Follow-up with general surgery in 2 to 3 weeks 4. Follow-up appointment made with [facility] on 7/12  Discharge Condition: Stable CODE STATUS: Full code Diet recommendation: Heart healthy  Brief/Interim Summary: Sharon Jordan  is a 74 y.o. female  with medical history significant ofCOPD O2 dependent 2 L at home, HTN, recent history of closed fracture of anterior left acetabulum, pelvic fracture,,chronic anemia, episodic delirium,depression, chronic hyponatremia, lupus, RLS presents from home with abdominal pain after being recently treated in the hospital for enterocolitis and then discharged to SNF she was discharged from SNF to home recently  --No fever no chills no further emesis -Evaluated by general surgeon clinical evaluation not impressive for possible internal hernia or bowel obstruction however CT abdomen/imaging studies suggest possible internal hernia -General surgery recommends observation and serial clinical exams to rule out possible obstructive process/internal hernia  -No fever no chills no chest pains no palpitations no dizziness -CT abdomen and pelvis noted discussed with general surgeon -CMP noted and largely unremarkable -Lipase 23 -CBC WNL except for hemoglobin of 11.6 -UA with increased specific gravity with no evidence of UTI  Discharge Diagnoses:  Active Problems:   Abdominal pain  1) abdominal pain--status post recent treatment for enterocolitis, -General surgery consult appreciated, -???  Internal hernia/obstructive finding on CT abdomen the pelvis General surgery recommended observation and serial clinical  exams to rule out possible obstructive process/internal hernia -Tolerating oral intake better, having BMs, abdominal pain or nausea has improved -Clinically obstructive process/internal hernia deemed to be less likely -It was felt reasonable to discharge the patient home at this time since she is tolerating p.o. intake, abdominal pain is improved and she is having bowel movements -If she does have recurrent abdominal pain, she may need exploratory laparotomy to further evaluate for internal hernias.  She has been advised to return to the emergency room if she has recurrent pain  2-Intertrochanteric fracture of left femur (HCC) -No signs of acute disalignment or fracture -Chronic changes associated with previous surgery. -Continue as needed analgesics -Recently discharged from skilled nursing facility rehab.  3- history of depression/anxiety--stable, continue Wellbutrin and Cymbalta -Continue as needed anxiolytics. 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 -Appears to be at baseline at this time  6)-history of osteoporosis -Continue Fosamax after discharge.  Discharge Instructions  Discharge Instructions    Diet - low sodium heart healthy   Complete by: As directed    Increase activity slowly   Complete by: As directed      Allergies as of 02/07/2020      Reactions   Amoxicillin Nausea Only   Cephalosporins Nausea Only   Clindamycin/lincomycin Nausea Only   Doxycycline Nausea Only   Penicillins Diarrhea   Patient states she is allergic to 'all antibiotics' states has diarrhea and nausea.      Medication List    TAKE these medications   acetaminophen 500 MG tablet Commonly known as: TYLENOL Take 2 tablets (1,000 mg total) by mouth 3  (three) times daily.   albuterol (2.5 MG/3ML) 0.083% nebulizer solution  Commonly known as: PROVENTIL Take 3 mLs (2.5 mg total) by nebulization every 8 (eight) hours as needed for wheezing or shortness of breath.   alum & mag hydroxide-simeth 200-200-20 MG/5ML suspension Commonly known as: MAALOX/MYLANTA Take 30 mLs by mouth every 4 (four) hours as needed for indigestion.   Artificial Tears 1 % ophthalmic solution Generic drug: carboxymethylcellulose Place 2 drops into both eyes 3 (three) times daily. Instill 2 drops both eyes three times a day as needed for dry eyes   aspirin EC 81 MG tablet Take 81 mg by mouth 2 (two) times daily. QAM and QHS   Breztri Aerosphere 160-9-4.8 MCG/ACT Aero Generic drug: Budeson-Glycopyrrol-Formoterol Inhale 1 puff into the lungs daily.   buPROPion 300 MG 24 hr tablet Commonly known as: WELLBUTRIN XL Take 1 tablet (300 mg total) by mouth daily.   calcium carbonate 500 MG chewable tablet Commonly known as: TUMS - dosed in mg elemental calcium Chew 1 tablet by mouth 3 (three) times daily with meals.   docusate sodium 100 MG capsule Commonly known as: COLACE Take 100 mg by mouth daily.   DULoxetine 30 MG capsule Commonly known as: CYMBALTA Take 2 capsules (60 mg total) by mouth daily.   feeding supplement (ENSURE ENLIVE) Liqd Take 237 mLs by mouth 2 (two) times daily between meals.   gabapentin 100 MG capsule Commonly known as: NEURONTIN Take 1 capsule (100 mg total) by mouth 2 (two) times daily.   guaiFENesin 600 MG 12 hr tablet Commonly known as: MUCINEX Take 2 tablets (1,200 mg total) by mouth 2 (two) times daily as needed for cough or to loosen phlegm.   hydrOXYzine 25 MG capsule Commonly known as: Vistaril Take 1 capsule (25 mg total) by mouth 3 (three) times daily as needed.   hyoscyamine 0.125 MG SL tablet Commonly known as: LEVSIN SL Take 1 tablet (0.125 mg total) by mouth every 8 (eight) hours as needed for cramping.    linaclotide 145 MCG Caps capsule Commonly known as: LINZESS Take 1 capsule (145 mcg total) by mouth daily before breakfast.   magnesium hydroxide 400 MG/5ML suspension Commonly known as: MILK OF MAGNESIA Take 30 mLs by mouth daily as needed for moderate constipation.   pantoprazole 40 MG tablet Commonly known as: PROTONIX Take 1 tablet (40 mg total) by mouth daily.   psyllium 58.6 % packet Commonly known as: METAMUCIL Take 1 packet by mouth at bedtime.   rOPINIRole 0.25 MG tablet Commonly known as: REQUIP Take 1 tablet (0.25 mg total) by mouth at bedtime.   simethicone 80 MG chewable tablet Commonly known as: MYLICON Chew 1 tablet (80 mg total) by mouth 3 (three) times daily as needed.   Vitamin D-3 125 MCG (5000 UT) Tabs Take 5,000 Units by mouth daily.       Follow-up Information    [facility] Follow up in 3 day(s).   Specialty: Behavioral Health Why: VIRTUAL APPOINTMENT with Florencia Reasons, LCSW, Monday, 02/09/2020 at 2:00 p.m. Contact information: [address] [place] [address] [phone]       Lucretia Roers, MD. Schedule an appointment as soon as possible for a visit in 2 week(s).   Specialty: General Surgery Contact information: [address] [facility] [address] [phone]        [name], MD. Schedule an appointment as soon as possible for a visit in 1 week(s).   Specialty: Internal Medicine Contact information: [address] [place] [address]  [phone]              Allergies  Allergen Reactions  . Amoxicillin Nausea Only  . Cephalosporins Nausea Only  . Clindamycin/Lincomycin Nausea Only  . Doxycycline Nausea Only  . Penicillins Diarrhea    Patient states she is allergic to 'all antibiotics' states has diarrhea and nausea.    Consultations:  General surgery   Procedures/Studies: CT ABDOMEN PELVIS W CONTRAST  Result Date:  02/04/2020 CLINICAL DATA:  74 year old presenting with acute onset of generalized abdominal pain. Current history of lupus. Surgical history includes appendectomy and hysterectomy. EXAM: CT ABDOMEN AND PELVIS WITH CONTRAST TECHNIQUE: Multidetector CT imaging of the abdomen and pelvis was performed using the standard protocol following bolus administration of intravenous contrast. CONTRAST:  OMNIPAQUE IOHEXOL 300 MG/ML IV. COMPARISON:  01/20/2020, 12/26/2019. FINDINGS: Lower chest: Heart size upper normal to slightly enlarged, unchanged. Mitral annular calcification. Visualized lung bases clear. Hepatobiliary: Liver normal in size and appearance. Surgically absent gallbladder. No unexpected biliary ductal dilation. Pancreas: Atrophic without evidence of mass or peripancreatic inflammation. Spleen: Normal in size and appearance. Adrenals/Urinary Tract: Normal appearing adrenal glands. Non-obstructing LEFT mid renal calculus measuring approximately 3 mm, unchanged. No urinary tract calculi elsewhere. No hydronephrosis involving either kidney. No focal parenchymal abnormalities involving either kidney. Irregular renal contours are likely related to persistent fetal lobulations, a developmental variant. Normal appearing urinary bladder. Stomach/Bowel: Moderately large hiatal hernia, unchanged. Stomach otherwise unremarkable for degree of distension. Likely duodenal diverticulum arising from the descending duodenum. Small bowel otherwise unremarkable. Normal-appearing small bowel loops are present LATERAL to the descending colon in the LEFT UPPER QUADRANT, possibly indicating an internal hernia. Moderate stool burden throughout the colon. Descending and sigmoid colon diverticulosis without evidence of acute diverticulitis. Surgically absent appendix. Vascular/Lymphatic: Moderate aorto-iliofemoral atherosclerosis without evidence of aneurysm. Normal-appearing portal venous and systemic venous systems. No pathologic  lymphadenopathy. Reproductive: Surgically absent uterus.  No adnexal masses. Other: Phleboliths in both sides of the pelvis. Musculoskeletal: Prior ORIF of a LEFT femoral neck fracture with healing. Osseous demineralization. Remote compression fracture of L1, unchanged. Diffuse degenerative disc disease and spondylosis throughout the lower thoracic and lumbar spine. Severe diffuse facet degenerative changes throughout the lumbar spine. Moderate to severe multifactorial spinal stenosis at L2-3, L3-4 and L4-5. No acute findings. IMPRESSION: 1. No acute abnormalities involving the abdomen or pelvis. 2. Normal-appearing small bowel loops LATERAL to the descending colon in the LEFT UPPER QUADRANT, possibly indicating an internal hernia. No evidence of small bowel obstruction. 3. Descending and sigmoid colon diverticulosis without evidence of acute diverticulitis. 4. Moderately large hiatal hernia, unchanged. 5. Non-obstructing LEFT mid renal calculus measuring approximately 3 mm. 6. Moderate to severe multifactorial spinal stenosis at L2-3, L3-4 and L4-5. Aortic Atherosclerosis (ICD10-I70.0). Electronically Signed   By: Hulan Saas M.D.   On: 02/04/2020 09:55       Subjective: No nausea or vomiting.  Feels abdominal pain is controlled.  Tolerating diet.  Having bowel movements.  Discharge Exam: Vitals:   02/06/20 1933 02/06/20 2116 02/07/20 0426 02/07/20 0959  BP:  139/63 (!) 101/52   Pulse:  92 90   Resp:  20 20   Temp:  99 F (37.2 C) 98.5 F (36.9 C)   TempSrc:  Oral Oral   SpO2: 90% 100% 100% 98%  Weight:      Height:        General: Pt is alert, awake, not in acute distress Cardiovascular: RRR, S1/S2 +, no rubs, no gallops Respiratory: CTA  bilaterally, no wheezing, no rhonchi Abdominal: Soft, NT, ND, bowel sounds + Extremities: no edema, no cyanosis    The results of significant diagnostics from this hospitalization (including imaging, microbiology, ancillary and laboratory) are  listed below for reference.     Microbiology: Recent Results (from the past 240 hour(s))  SARS Coronavirus 2 by RT PCR (hospital order, performed in [facility] hospital lab) Nasopharyngeal Nasopharyngeal Swab     Status: None   Collection Time: 02/04/20 11:16 AM   Specimen: Nasopharyngeal Swab  Result Value Ref Range Status   SARS Coronavirus 2 NEGATIVE NEGATIVE Final    Comment: (NOTE) SARS-CoV-2 target nucleic acids are NOT DETECTED.  The SARS-CoV-2 RNA is generally detectable in upper and lower respiratory specimens during the acute phase of infection. The lowest concentration of SARS-CoV-2 viral copies this assay can detect is 250 copies / mL. A negative result does not preclude SARS-CoV-2 infection and should not be used as the sole basis for treatment or other patient management decisions.  A negative result may occur with improper specimen collection / handling, submission of specimen other than nasopharyngeal swab, presence of viral mutation(s) within the areas targeted by this assay, and inadequate number of viral copies (<250 copies / mL). A negative result must be combined with clinical observations, patient history, and epidemiological information.  Fact Sheet for Patients:   BoilerBrush.com.cy  Fact Sheet for Healthcare Providers: https://pope.com/  This test is not yet approved or  cleared by the [place] FDA and has been authorized for detection and/or diagnosis of SARS-CoV-2 by FDA under an Emergency Use Authorization (EUA).  This EUA will remain in effect (meaning this test can be used) for the duration of the COVID-19 declaration under Section 564(b)(1) of the Act, 21 U.S.C. section 360bbb-3(b)(1), unless the authorization is terminated or revoked sooner.  Performed at [facility], [address], [place], [place] [address]      Labs: BNP (last 3 results) Recent Labs    12/26/19 0918  BNP 91.0    Basic Metabolic Panel: Recent Labs  Lab 02/04/20 0759 02/05/20 0347 02/07/20 0621  NA 136 137 136  K 3.8 3.9 3.7  CL 91* 94* 93*  CO2 36* 34* 35*  GLUCOSE 99 87 83  BUN CREATININE 0.72 0.58 0.57  CALCIUM 9.3 8.9 8.8*   Liver Function Tests: Recent Labs  Lab 02/04/20 0759  AST 13*  ALT 11  ALKPHOS 66  BILITOT 0.7  PROT 6.5  ALBUMIN 3.8   Recent Labs  Lab 02/04/20 0759  LIPASE 23   No results for input(s): AMMONIA in the last 168 hours. CBC: Recent Labs  Lab 02/04/20 0759 02/05/20 0347 02/07/20 0621  WBC 6.7 4.2 5.4  NEUTROABS 5.0  --   --   HGB 11.6* 10.2* 9.7*  HCT 37.6 33.8* 32.3*  MCV 95.7 97.7 96.7  PLT 276 235 220   Cardiac Enzymes: No results for input(s): CKTOTAL, CKMB, CKMBINDEX, TROPONINI in the last 168 hours. BNP: Invalid input(s): POCBNP CBG: Recent Labs  Lab 02/06/20 1105 02/06/20 1658 02/07/20 0202 02/07/20 0454 02/07/20 1121  GLUCAP 111* 110* 149* 130* 98   D-Dimer No results for input(s): DDIMER in the last 72 hours. Hgb A1c No results for input(s): HGBA1C in the last 72 hours. Lipid Profile No results for input(s): CHOL, HDL, LDLCALC, TRIG, CHOLHDL, LDLDIRECT in the last 72 hours. Thyroid function studies No results for input(s): TSH, T4TOTAL, T3FREE, THYROIDAB in the last 72 hours.  Invalid input(s): FREET3 Anemia work up No results for input(s): VITAMINB12, FOLATE, FERRITIN, TIBC, IRON, RETICCTPCT in the last 72 hours. Urinalysis    Component Value Date/Time   COLORURINE YELLOW 02/04/2020 0759   APPEARANCEUR HAZY (A) 02/04/2020 0759   LABSPEC 1.039 (H) 02/04/2020 0759   PHURINE 8.0 02/04/2020 0759   GLUCOSEU NEGATIVE 02/04/2020 0759   HGBUR NEGATIVE 02/04/2020 0759   BILIRUBINUR NEGATIVE 02/04/2020 0759   KETONESUR NEGATIVE 02/04/2020 0759   PROTEINUR NEGATIVE 02/04/2020 0759   NITRITE NEGATIVE 02/04/2020 0759   LEUKOCYTESUR NEGATIVE 02/04/2020 0759   Sepsis Labs Invalid input(s): PROCALCITONIN,  WBC,   LACTICIDVEN Microbiology Recent Results (from the past 240 hour(s))  SARS Coronavirus 2 by RT PCR (hospital order, performed in [facility] hospital lab) Nasopharyngeal Nasopharyngeal Swab     Status: None   Collection Time: 02/04/20 11:16 AM   Specimen: Nasopharyngeal Swab  Result Value Ref Range Status   SARS Coronavirus 2 NEGATIVE NEGATIVE Final    Comment: (NOTE) SARS-CoV-2 target nucleic acids are NOT DETECTED.  The SARS-CoV-2 RNA is generally detectable in upper and lower respiratory specimens during the acute phase of infection. The lowest concentration of SARS-CoV-2 viral copies this assay can detect is 250 copies / mL. A negative result does not preclude SARS-CoV-2 infection and should not be used as the sole basis for treatment or other patient management decisions.  A negative result may occur with improper specimen collection / handling, submission of specimen other than nasopharyngeal swab, presence of viral mutation(s) within the areas targeted by this assay, and inadequate number of viral copies (<250 copies / mL). A negative result must be combined with clinical observations, patient history, and epidemiological information.  Fact Sheet for Patients:   BoilerBrush.com.cy  Fact Sheet for Healthcare Providers: https://pope.com/  This test is not yet approved or  cleared by the [place] FDA and has been authorized for detection and/or diagnosis of SARS-CoV-2 by FDA under an Emergency Use Authorization (EUA).  This EUA will remain in effect (meaning this test can be used) for the duration of the COVID-19 declaration under Section 564(b)(1) of the Act, 21 U.S.C. section 360bbb-3(b)(1), unless the authorization is terminated or revoked sooner.  Performed at [facility], [address], [place], [place] [address]      Time coordinating discharge:  SIGNED:   Erick Blinks, MD  [facility] 02/07/2020, 7:36 PM   If 7PM-7AM, please contact night-coverage [email or web address]

## 2020-02-09 ENCOUNTER — Emergency Department (HOSPITAL_COMMUNITY)
Admission: EM | Admit: 2020-02-09 | Discharge: 2020-02-09 | Disposition: A | Discharge status: Home / Self Care | Payer: Medicare Other | Attending: Emergency Medicine | Admitting: Emergency Medicine

## 2020-02-09 ENCOUNTER — Ambulatory Visit (HOSPITAL_COMMUNITY): Payer: Medicare Other | Admitting: Psychiatry

## 2020-02-09 ENCOUNTER — Encounter (HOSPITAL_COMMUNITY): Payer: Self-pay | Admitting: Emergency Medicine

## 2020-02-09 ENCOUNTER — Telehealth (HOSPITAL_COMMUNITY): Payer: Self-pay | Admitting: Psychiatry

## 2020-02-09 ENCOUNTER — Emergency Department (HOSPITAL_COMMUNITY): Payer: Medicare Other

## 2020-02-09 ENCOUNTER — Other Ambulatory Visit: Payer: Self-pay

## 2020-02-09 DIAGNOSIS — Z7951 Long term (current) use of inhaled steroids: Secondary | ICD-10-CM | POA: Diagnosis not present

## 2020-02-09 DIAGNOSIS — K219 Gastro-esophageal reflux disease without esophagitis: Secondary | ICD-10-CM | POA: Diagnosis not present

## 2020-02-09 DIAGNOSIS — Z87891 Personal history of nicotine dependence: Secondary | ICD-10-CM | POA: Insufficient documentation

## 2020-02-09 DIAGNOSIS — J449 Chronic obstructive pulmonary disease, unspecified: Secondary | ICD-10-CM | POA: Diagnosis not present

## 2020-02-09 DIAGNOSIS — I10 Essential (primary) hypertension: Secondary | ICD-10-CM | POA: Insufficient documentation

## 2020-02-09 DIAGNOSIS — R109 Unspecified abdominal pain: Secondary | ICD-10-CM | POA: Diagnosis present

## 2020-02-09 DIAGNOSIS — R1084 Generalized abdominal pain: Secondary | ICD-10-CM | POA: Insufficient documentation

## 2020-02-09 DIAGNOSIS — Z96642 Presence of left artificial hip joint: Secondary | ICD-10-CM | POA: Insufficient documentation

## 2020-02-09 LAB — CBC
HCT: 41.3 % (ref 36.0–46.0)
Hemoglobin: 12.8 g/dL (ref 12.0–15.0)
MCH: 29.6 pg (ref 26.0–34.0)
MCHC: 31 g/dL (ref 30.0–36.0)
MCV: 95.4 fL (ref 80.0–100.0)
Platelets: 353 10*3/uL (ref 150–400)
RBC: 4.33 MIL/uL (ref 3.87–5.11)
RDW: 14.4 % (ref 11.5–15.5)
WBC: 7.5 10*3/uL (ref 4.0–10.5)
nRBC: 0 % (ref 0.0–0.2)

## 2020-02-09 LAB — COMPREHENSIVE METABOLIC PANEL
ALT: 12 U/L (ref 0–44)
AST: 16 U/L (ref 15–41)
Albumin: 3.8 g/dL (ref 3.5–5.0)
Alkaline Phosphatase: 68 U/L (ref 38–126)
Anion gap: 10 (ref 5–15)
BUN: 10 mg/dL (ref 8–23)
CO2: 34 mmol/L — ABNORMAL HIGH (ref 22–32)
Calcium: 10 mg/dL (ref 8.9–10.3)
Chloride: 94 mmol/L — ABNORMAL LOW (ref 98–111)
Creatinine, Ser: 0.54 mg/dL (ref 0.44–1.00)
GFR calc Af Amer: >60 mL/min (ref >60)
GFR calc non Af Amer: >60 mL/min (ref >60)
Glucose, Bld: 98 mg/dL (ref 70–99)
Potassium: 4.6 mmol/L (ref 3.5–5.1)
Sodium: 138 mmol/L (ref 135–145)
Total Bilirubin: 0.8 mg/dL (ref 0.3–1.2)
Total Protein: 7.1 g/dL (ref 6.5–8.1)

## 2020-02-09 LAB — URINALYSIS, ROUTINE W REFLEX MICROSCOPIC
Bilirubin Urine: NEGATIVE
Glucose, UA: NEGATIVE mg/dL
Hgb urine dipstick: NEGATIVE
Ketones, ur: 20 mg/dL — AB
Leukocytes,Ua: NEGATIVE
Nitrite: NEGATIVE
Protein, ur: NEGATIVE mg/dL
Specific Gravity, Urine: 1.012 (ref 1.005–1.030)
pH: 8 (ref 5.0–8.0)

## 2020-02-09 LAB — LIPASE, BLOOD: Lipase: 18 U/L (ref 11–51)

## 2020-02-09 MED ORDER — HYDROMORPHONE HCL 1 MG/ML IJ SOLN
0.5000 mg | Freq: Once | INTRAMUSCULAR | Status: DC
  Filled 2020-02-09: qty 1

## 2020-02-09 MED ORDER — DICYCLOMINE HCL 10 MG PO CAPS
10.0000 mg | ORAL_CAPSULE | Freq: Once | ORAL | Status: AC
  Administered 2020-02-09: 10 mg via ORAL
  Filled 2020-02-09: qty 1

## 2020-02-09 MED ORDER — HYDROCODONE-ACETAMINOPHEN 5-325 MG PO TABS: 1.0000 | ORAL_TABLET | Freq: Three times a day (TID) | ORAL | 0 refills | Status: DC | PRN

## 2020-02-09 MED ORDER — HYDROMORPHONE HCL 1 MG/ML IJ SOLN
0.5000 mg | Freq: Once | INTRAMUSCULAR | Status: AC
  Administered 2020-02-09: 0.5 mg via INTRAMUSCULAR
  Filled 2020-02-09: qty 1

## 2020-02-09 MED ORDER — IOHEXOL 300 MG/ML  SOLN
75.0000 mL | Freq: Once | INTRAMUSCULAR | Status: AC | PRN
  Administered 2020-02-09: 75 mL via INTRAVENOUS

## 2020-02-09 MED ORDER — IOHEXOL 9 MG/ML PO SOLN
ORAL | Status: AC
  Filled 2020-02-09: qty 1000

## 2020-02-09 MED ORDER — HYDROMORPHONE HCL 1 MG/ML IJ SOLN: 1.0000 mg | Freq: Once | INTRAMUSCULAR | Status: DC

## 2020-02-09 MED ORDER — HYDROMORPHONE HCL 1 MG/ML IJ SOLN: 0.5000 mg | Freq: Once | INTRAMUSCULAR | Status: DC

## 2020-02-09 NOTE — ED Triage Notes (Signed)
Pt brought in by [facility] with c/o intermittent abdominal pain x 1 year. Pt recently admitted for UTI. Pt reports the abdominal pain hasn't gotten any better since she was at the hospital so she was told to come back by the discharging doctor if it didn't improve.

## 2020-02-09 NOTE — ED Notes (Signed)
Pt placed on purewick 

## 2020-02-09 NOTE — ED Notes (Signed)
Pt is aware we need urine sample.  

## 2020-02-09 NOTE — Telephone Encounter (Signed)
Therapist called patient for scheduled appointment and was informed patient is in the hospital.

## 2020-02-09 NOTE — ED Notes (Signed)
Pt was able to drink about half of one bottle of contrast and then became nauseated and started vomiting small amount of fluid. Pt reports she cannot drink anymore of the contrast. CT tech notified.

## 2020-02-09 NOTE — ED Notes (Signed)
EMS here to pick pt up and transport to home.

## 2020-02-09 NOTE — ED Notes (Signed)
Patient upset with having to wait.  Explained to patient we have ordered lab work to get the process started.  There is not bed available in the back to place her right now.   She states she wants to call her son to come get her.  Guest representative helping patient call her son.

## 2020-02-09 NOTE — ED Triage Notes (Signed)
Patient states she was discharged from the hospital yesterday.  Severe ABD pain today states she feels like she is constipated. No diarrhea or vomiting.

## 2020-02-10 ENCOUNTER — Emergency Department (HOSPITAL_COMMUNITY)
Admission: EM | Admit: 2020-02-10 | Discharge: 2020-02-10 | Disposition: A | Discharge status: Home / Self Care | Payer: Medicare Other | Attending: Emergency Medicine | Admitting: Emergency Medicine

## 2020-02-10 ENCOUNTER — Encounter (HOSPITAL_COMMUNITY): Payer: Self-pay

## 2020-02-10 ENCOUNTER — Other Ambulatory Visit: Payer: Self-pay

## 2020-02-10 ENCOUNTER — Ambulatory Visit (HOSPITAL_COMMUNITY)
Admission: EM | Admit: 2020-02-10 | Discharge: 2020-02-10 | Disposition: A | Discharge status: Home / Self Care | Payer: Medicare Other | Source: Home / Self Care

## 2020-02-10 DIAGNOSIS — R5381 Other malaise: Secondary | ICD-10-CM | POA: Diagnosis not present

## 2020-02-10 DIAGNOSIS — Z87891 Personal history of nicotine dependence: Secondary | ICD-10-CM | POA: Insufficient documentation

## 2020-02-10 DIAGNOSIS — I700 Atherosclerosis of aorta: Secondary | ICD-10-CM | POA: Insufficient documentation

## 2020-02-10 DIAGNOSIS — Z880 Allergy status to penicillin: Secondary | ICD-10-CM | POA: Diagnosis not present

## 2020-02-10 DIAGNOSIS — G8929 Other chronic pain: Secondary | ICD-10-CM | POA: Diagnosis not present

## 2020-02-10 DIAGNOSIS — K449 Diaphragmatic hernia without obstruction or gangrene: Secondary | ICD-10-CM | POA: Insufficient documentation

## 2020-02-10 DIAGNOSIS — R109 Unspecified abdominal pain: Secondary | ICD-10-CM | POA: Insufficient documentation

## 2020-02-10 DIAGNOSIS — N200 Calculus of kidney: Secondary | ICD-10-CM | POA: Diagnosis not present

## 2020-02-10 DIAGNOSIS — K219 Gastro-esophageal reflux disease without esophagitis: Secondary | ICD-10-CM | POA: Insufficient documentation

## 2020-02-10 DIAGNOSIS — Z881 Allergy status to other antibiotic agents status: Secondary | ICD-10-CM | POA: Insufficient documentation

## 2020-02-10 DIAGNOSIS — M329 Systemic lupus erythematosus, unspecified: Secondary | ICD-10-CM | POA: Diagnosis not present

## 2020-02-10 DIAGNOSIS — Z79899 Other long term (current) drug therapy: Secondary | ICD-10-CM | POA: Diagnosis not present

## 2020-02-10 DIAGNOSIS — K5730 Diverticulosis of large intestine without perforation or abscess without bleeding: Secondary | ICD-10-CM | POA: Insufficient documentation

## 2020-02-10 DIAGNOSIS — Z7982 Long term (current) use of aspirin: Secondary | ICD-10-CM | POA: Insufficient documentation

## 2020-02-10 DIAGNOSIS — R1084 Generalized abdominal pain: Secondary | ICD-10-CM

## 2020-02-10 DIAGNOSIS — I10 Essential (primary) hypertension: Secondary | ICD-10-CM | POA: Diagnosis not present

## 2020-02-10 DIAGNOSIS — J449 Chronic obstructive pulmonary disease, unspecified: Secondary | ICD-10-CM | POA: Diagnosis not present

## 2020-02-10 DIAGNOSIS — J90 Pleural effusion, not elsewhere classified: Secondary | ICD-10-CM | POA: Insufficient documentation

## 2020-02-10 LAB — COMPREHENSIVE METABOLIC PANEL
ALT: 10 U/L (ref 0–44)
AST: 14 U/L — ABNORMAL LOW (ref 15–41)
Albumin: 3.5 g/dL (ref 3.5–5.0)
Alkaline Phosphatase: 64 U/L (ref 38–126)
Anion gap: 14 (ref 5–15)
BUN: 14 mg/dL (ref 8–23)
CO2: 30 mmol/L (ref 22–32)
Calcium: 9.5 mg/dL (ref 8.9–10.3)
Chloride: 92 mmol/L — ABNORMAL LOW (ref 98–111)
Creatinine, Ser: 0.72 mg/dL (ref 0.44–1.00)
GFR calc Af Amer: >60 mL/min (ref >60)
GFR calc non Af Amer: >60 mL/min (ref >60)
Glucose, Bld: 101 mg/dL — ABNORMAL HIGH (ref 70–99)
Potassium: 4 mmol/L (ref 3.5–5.1)
Sodium: 136 mmol/L (ref 135–145)
Total Bilirubin: 0.7 mg/dL (ref 0.3–1.2)
Total Protein: 6.2 g/dL — ABNORMAL LOW (ref 6.5–8.1)

## 2020-02-10 LAB — LIPASE, BLOOD: Lipase: 22 U/L (ref 11–51)

## 2020-02-10 LAB — CBC
HCT: 37.8 % (ref 36.0–46.0)
Hemoglobin: 11.8 g/dL — ABNORMAL LOW (ref 12.0–15.0)
MCH: 28.6 pg (ref 26.0–34.0)
MCHC: 31.2 g/dL (ref 30.0–36.0)
MCV: 91.7 fL (ref 80.0–100.0)
Platelets: 343 10*3/uL (ref 150–400)
RBC: 4.12 MIL/uL (ref 3.87–5.11)
RDW: 14.2 % (ref 11.5–15.5)
WBC: 7 10*3/uL (ref 4.0–10.5)
nRBC: 0 % (ref 0.0–0.2)

## 2020-02-10 LAB — URINALYSIS, ROUTINE W REFLEX MICROSCOPIC
Bilirubin Urine: NEGATIVE
Glucose, UA: NEGATIVE mg/dL
Hgb urine dipstick: NEGATIVE
Ketones, ur: 20 mg/dL — AB
Leukocytes,Ua: NEGATIVE
Nitrite: NEGATIVE
Protein, ur: NEGATIVE mg/dL
Specific Gravity, Urine: 1.014 (ref 1.005–1.030)
pH: 7 (ref 5.0–8.0)

## 2020-02-10 LAB — LACTIC ACID, PLASMA: Lactic Acid, Venous: 0.9 mmol/L (ref 0.5–1.9)

## 2020-02-10 MED ORDER — HYDROCODONE-ACETAMINOPHEN 5-325 MG PO TABS
1.0000 | ORAL_TABLET | Freq: Once | ORAL | Status: AC
  Administered 2020-02-10: 1 via ORAL
  Filled 2020-02-10: qty 1

## 2020-02-10 MED ORDER — LOPERAMIDE HCL 2 MG PO CAPS
4.0000 mg | ORAL_CAPSULE | ORAL | Status: DC | PRN
  Administered 2020-02-10: 4 mg via ORAL
  Filled 2020-02-10: qty 2

## 2020-02-10 NOTE — ED Notes (Signed)
Patient verbalizes understanding of discharge instructions. Opportunity for questioning and answers were provided. Armband removed by staff, pt discharged from ED via wheelchair.  

## 2020-02-10 NOTE — Discharge Instructions (Signed)
Taking you to the ER

## 2020-02-10 NOTE — ED Notes (Signed)
Patient is being discharged from the Urgent Care and sent to the Emergency Department via wheelchair . Per Jaci Lazier, NP, patient is in need of higher level of care due to shortness of breath. Patient is aware and verbalizes understanding of plan of care.  Vitals:   02/10/20 0908  BP: (!) 142/70  Pulse: (!) 107  Resp: 19  Temp: 97.8 F (36.6 C)  SpO2: 98%

## 2020-02-10 NOTE — Discharge Instructions (Addendum)
The testing today does not show any serious problems.  It is safe to go home and continue your current medications.  For diarrhea you can use Kaopectate or Imodium.  Follow-up with your primary care doctor for further help with your ongoing problems.  We will have the home health services evaluate you in your home to provide further care, as needed.
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
## 2020-02-10 NOTE — ED Notes (Signed)
Pt toileted.  

## 2020-02-10 NOTE — ED Provider Notes (Signed)
Patient presents today with severe 10 out of 10 abdominal pain, diarrhea shortness of breath. Vital signs stable today.  She was just in the ER yesterday has been hospitalized recently. We will send back to the ER for further management.   Dahlia Byes A, NP 02/10/20 [phone]
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
## 2020-02-10 NOTE — ED Triage Notes (Signed)
Pt reports abd cramping, pt recently admitted for abd infection and discharged. Nausea but no vomiting, Multiple episodes of diarrhea today. Pt tearful in triage

## 2020-02-11 LAB — GASTROINTESTINAL PANEL BY PCR, STOOL (REPLACES STOOL CULTURE)

## 2020-02-15 NOTE — ED Provider Notes (Signed)
[facility] EMERGENCY DEPARTMENT Provider Note   CSN: [id number] Arrival date & time: 02/09/20  4098     History No chief complaint on file.   Sharon Jordan is a 74 y.o. female.  HPI   74 year old female with abdominal pain.  Patient just left the hospital with abdominal pain after being treated for enterocolitis.  Questional internal hernia on imaging.  Evaluated by general surgery.  Patient states that her abdominal pain started to worsen again and she left.  She was told to come back to the emergency room if it did.  Second pain she had previously.  No vomiting.  No acute urinary complaints.  Past Medical History:  Diagnosis Date  . Anemia   . At risk for delirium   . Closed fracture of anterior column of left acetabulum (HCC)   . Closed fracture of pelvis (HCC)   . COPD (chronic obstructive pulmonary disease) (HCC)   . Depression   . History of rheumatic fever   . HTN (hypertension)   . Hyponatremia   . Lupus (HCC)   . RLS (restless legs syndrome)     Patient Active Problem List   Diagnosis Date Noted  . Abdominal pain 02/04/2020  . Enterocolitis 12/26/2019  . Closed trochanteric fracture of right femur with routine healing 10/28/19 11/13/2019  . Chronic respiratory failure with hypoxia (HCC)   . Anxiety   . Hip fracture (HCC) 10/27/2019  . Intertrochanteric fracture of left femur (HCC) 09/05/2019  . Fracture of acetabulum, left, closed (HCC) 09/05/2019  . Hypertension 09/05/2019  . COPD (chronic obstructive pulmonary disease) (HCC) 09/05/2019  . Depression 09/05/2019  . GERD (gastroesophageal reflux disease) 09/05/2019  . RLS (restless legs syndrome) 09/05/2019  . Anemia 08/26/2019  . At risk for delirium 08/26/2019  . Hyponatremia 08/26/2019  . Closed fracture of pelvis (HCC) 08/23/2019    Past Surgical History:  Procedure Laterality Date  . ABDOMINAL HYSTERECTOMY    . APPENDECTOMY    . 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  CLOSED MANIPULATION AND CLOSED TREATMENT OF LEFT ACETABULUM FRACTURE, BOTH COLUMNS Left   . HIP SURGERY Left   . JOINT REPLACEMENT    . REMOVAL OF DEEP HARDWARE, LEFT INTRAMEDULLARY NAIL Left    TFN  . 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 TONSILLECTOMY       OB History    Gravida  3   Para      Term      Preterm      AB      Living  2     SAB      TAB      Ectopic      Multiple      Live Births              No family history on file.  Social History   Tobacco Use  . Smoking status: Former Games developer  . Smokeless tobacco: Never Used  Vaping Use  . Vaping Use: Never used  Substance Use Topics  . Alcohol use: Never  . Drug use: Never    Home Medications Prior to Admission medications   Medication Sig Start Date End Date Taking? Authorizing Provider  acetaminophen (TYLENOL) 500 MG tablet Take 2 tablets (1,000 mg total) by mouth 3 (three) times daily. 01/06/20  Yes Reed, Tiffany L, DO  albuterol (PROVENTIL) (2.5 MG/3ML) 0.083% nebulizer solution Take 3 mLs (  2.5 mg total) by nebulization every 8 (eight) hours as needed for wheezing or shortness of breath. 01/20/20  Yes Trudie Reed, Arlo C, PA-C  alum & mag hydroxide-simeth (MAALOX/MYLANTA) 200-200-20 MG/5ML suspension Take 30 mLs by mouth every 4 (four) hours as needed for indigestion. 01/03/20  Yes Sherryll Burger, Pratik D, DO  aspirin EC 81 MG tablet Take 81 mg by mouth 2 (two) times daily. QAM and QHS   Yes [provider]  Budeson-Glycopyrrol-Formoterol (BREZTRI AEROSPHERE) 160-9-4.8 MCG/ACT AERO Inhale 1 puff into the lungs daily. 01/20/20  Yes Lassen, Arlo C, PA-C  buPROPion (WELLBUTRIN XL) 300 MG 24 hr tablet Take 1 tablet (300 mg total) by mouth daily. 01/20/20  Yes Lassen, Arlo C, PA-C  calcium carbonate (TUMS - DOSED IN MG ELEMENTAL CALCIUM) 500 MG chewable tablet Chew 1 tablet by mouth 3 (three) times daily with meals.    Yes [provider]    carboxymethylcellulose (ARTIFICIAL TEARS) 1 % ophthalmic solution Place 2 drops into both eyes 3 (three) times daily. Instill 2 drops both eyes three times a day as needed for dry eyes 10/22/19  Yes Lassen, Arlo C, PA-C  Cholecalciferol (VITAMIN D-3) 125 MCG (5000 UT) TABS Take 5,000 Units by mouth daily. 01/16/20  Yes Reed, Tiffany L, DO  docusate sodium (COLACE) 100 MG capsule Take 100 mg by mouth daily.   Yes [provider]  DULoxetine (CYMBALTA) 30 MG capsule Take 2 capsules (60 mg total) by mouth daily. 01/20/20  Yes Lassen, Arlo C, PA-C  feeding supplement, ENSURE ENLIVE, (ENSURE ENLIVE) LIQD Take 237 mLs by mouth 2 (two) times daily between meals. 01/03/20  Yes Shah, Pratik D, DO  gabapentin (NEURONTIN) 100 MG capsule Take 1 capsule (100 mg total) by mouth 2 (two) times daily. 01/20/20  Yes Lassen, Arlo C, PA-C  guaiFENesin (MUCINEX) 600 MG 12 hr tablet Take 2 tablets (1,200 mg total) by mouth 2 (two) times daily as needed for cough or to loosen phlegm. 01/06/20  Yes Reed, Tiffany L, DO  hydrOXYzine (VISTARIL) 25 MG capsule Take 1 capsule (25 mg total) by mouth 3 (three) times daily as needed. 02/07/20  Yes Erick Blinks, MD  linaclotide Karlene Einstein) 145 MCG CAPS capsule Take 1 capsule (145 mcg total) by mouth daily before breakfast. 01/20/20  Yes Trudie Reed, Arlo C, PA-C  magnesium hydroxide (MILK OF MAGNESIA) 400 MG/5ML suspension Take 30 mLs by mouth daily as needed for moderate constipation. 10/31/19  Yes Vassie Loll, MD  pantoprazole (PROTONIX) 40 MG tablet Take 1 tablet (40 mg total) by mouth daily. 01/20/20  Yes Lassen, Arlo C, PA-C  psyllium (METAMUCIL) 58.6 % packet Take 1 packet by mouth at bedtime.   Yes [provider]  rOPINIRole (REQUIP) 0.25 MG tablet Take 1 tablet (0.25 mg total) by mouth at bedtime. 01/20/20  Yes Trudie Reed, Arlo C, PA-C  simethicone (MYLICON) 80 MG chewable tablet Chew 1 tablet (80 mg total) by mouth 3 (three) times daily as needed. 01/20/20  Yes Lassen, Arlo C,  PA-C  HYDROcodone-acetaminophen (NORCO/VICODIN) 5-325 MG tablet Take 1 tablet by mouth every 8 (eight) hours as needed. 02/09/20   Raeford Razor, MD  hyoscyamine (LEVSIN SL) 0.125 MG SL tablet Take 1 tablet (0.125 mg total) by mouth every 8 (eight) hours as needed for cramping. 01/20/20   Edmon Crape C, PA-C    Allergies    Amoxicillin, Cephalosporins, Clindamycin/lincomycin, Doxycycline, and Penicillins  Review of Systems   Review of Systems All systems reviewed and negative, other than as noted  in HPI.  Physical Exam Updated Vital Signs BP 120/71   Pulse 96   Temp 98.5 F (36.9 C) (Oral)   Resp 20   Ht 5' (1.524 m)   Wt 52.2 kg   SpO2 99%   BMI 22.46 kg/m   Physical Exam Vitals and nursing note reviewed.  Constitutional:      General: She is not in acute distress.    Appearance: She is well-developed.  HENT:     Head: Normocephalic and atraumatic.  Eyes:     General:        Right eye: No discharge.        Left eye: No discharge.     Conjunctiva/sclera: Conjunctivae normal.  Cardiovascular:     Rate and Rhythm: Normal rate and regular rhythm.     Heart sounds: Normal heart sounds. No murmur heard.  No friction rub. No gallop.   Pulmonary:     Effort: Pulmonary effort is normal. No respiratory distress.     Breath sounds: Normal breath sounds.  Abdominal:     General: There is no distension.     Palpations: Abdomen is soft.     Tenderness: There is no abdominal tenderness.  Musculoskeletal:        General: No tenderness.     Cervical back: Neck supple.  Skin:    General: Skin is warm and dry.  Neurological:     Mental Status: She is alert.  Psychiatric:        Behavior: Behavior normal.        Thought Content: Thought content normal.     ED Results / Procedures / Treatments   Labs (all labs ordered are listed, but only abnormal results are displayed) Labs Reviewed  COMPREHENSIVE METABOLIC PANEL - Abnormal; Notable for the following components:       Result Value   Chloride 94 (*)    CO2 34 (*)    All other components within normal limits  URINALYSIS, ROUTINE W REFLEX MICROSCOPIC - Abnormal; Notable for the following components:   APPearance CLOUDY (*)    Ketones, ur 20 (*)    All other components within normal limits  LIPASE, BLOOD  CBC    EKG None  Radiology No results found.   CT ABDOMEN PELVIS W CONTRAST  Result Date: 02/09/2020 CLINICAL DATA:  Intermittent abdominal pain for 1 year EXAM: CT ABDOMEN AND PELVIS WITH CONTRAST TECHNIQUE: Multidetector CT imaging of the abdomen and pelvis was performed using the standard protocol following bolus administration of intravenous contrast. CONTRAST:  75mL OMNIPAQUE IOHEXOL 300 MG/ML  SOLN COMPARISON:  02/04/2020, 12/31/2019, 12/26/2019 FINDINGS: Lower chest: There is a trace left pleural effusion, new since prior study. No acute parenchymal lung disease. Stable hiatal hernia. Hepatobiliary: No focal liver abnormality is seen. Status post cholecystectomy. No biliary dilatation. Pancreas: Unremarkable. No pancreatic ductal dilatation or surrounding inflammatory changes. Spleen: Normal in size without focal abnormality. Adrenals/Urinary Tract: Stable punctate nonobstructing left renal calculus. There is a new punctate less than 2 mm nonobstructing calculus lower pole right kidney. No obstructive uropathy. The bladder is decompressed with no gross abnormalities. The adrenals are stable. Stomach/Bowel: No bowel obstruction or ileus. There is diverticulosis of the distal colon without diverticulitis. There is segmental wall thickening involving the ascending and proximal transverse colon, which may be due to under distension. No surrounding inflammatory change. Appendix is surgically absent. Vascular/Lymphatic: Aortic atherosclerosis. No enlarged abdominal or pelvic lymph nodes. Reproductive: Status post hysterectomy. No adnexal masses. Other: No  abdominal wall hernia or abnormality. No abdominopelvic  ascites. Musculoskeletal: No acute or destructive bony lesions. Postsurgical changes left femur. Reconstructed images demonstrate stable L1 compression deformity. IMPRESSION: 1. Punctate bilateral nonobstructing renal calculi. 2. Hiatal hernia. 3. Trace left pleural effusion. 4. Segmental wall thickening of the proximal colon, felt to represent under distension rather than active inflammation/infection. 5. Diverticulosis without diverticulitis. 6.  Aortic Atherosclerosis (ICD10-I70.0). Electronically Signed   By: Sharlet SalinaMichael  Brown M.D.   On: 02/09/2020 15:15   CT ABDOMEN PELVIS W CONTRAST  Result Date: 02/04/2020 CLINICAL DATA:  74 year old presenting with acute onset of generalized abdominal pain. Current history of lupus. Surgical history includes appendectomy and hysterectomy. EXAM: CT ABDOMEN AND PELVIS WITH CONTRAST TECHNIQUE: Multidetector CT imaging of the abdomen and pelvis was performed using the standard protocol following bolus administration of intravenous contrast. CONTRAST:  100mL OMNIPAQUE IOHEXOL 300 MG/ML IV. COMPARISON:  01/20/2020, 12/26/2019. FINDINGS: Lower chest: Heart size upper normal to slightly enlarged, unchanged. Mitral annular calcification. Visualized lung bases clear. Hepatobiliary: Liver normal in size and appearance. Surgically absent gallbladder. No unexpected biliary ductal dilation. Pancreas: Atrophic without evidence of mass or peripancreatic inflammation. Spleen: Normal in size and appearance. Adrenals/Urinary Tract: Normal appearing adrenal glands. Non-obstructing LEFT mid renal calculus measuring approximately 3 mm, unchanged. No urinary tract calculi elsewhere. No hydronephrosis involving either kidney. No focal parenchymal abnormalities involving either kidney. Irregular renal contours are likely related to persistent fetal lobulations, a developmental variant. Normal appearing urinary bladder. Stomach/Bowel: Moderately large hiatal hernia, unchanged. Stomach otherwise  unremarkable for degree of distension. Likely duodenal diverticulum arising from the descending duodenum. Small bowel otherwise unremarkable. Normal-appearing small bowel loops are present LATERAL to the descending colon in the LEFT UPPER QUADRANT, possibly indicating an internal hernia. Moderate stool burden throughout the colon. Descending and sigmoid colon diverticulosis without evidence of acute diverticulitis. Surgically absent appendix. Vascular/Lymphatic: Moderate aorto-iliofemoral atherosclerosis without evidence of aneurysm. Normal-appearing portal venous and systemic venous systems. No pathologic lymphadenopathy. Reproductive: Surgically absent uterus.  No adnexal masses. Other: Phleboliths in both sides of the pelvis. Musculoskeletal: Prior ORIF of a LEFT femoral neck fracture with healing. Osseous demineralization. Remote compression fracture of L1, unchanged. Diffuse degenerative disc disease and spondylosis throughout the lower thoracic and lumbar spine. Severe diffuse facet degenerative changes throughout the lumbar spine. Moderate to severe multifactorial spinal stenosis at L2-3, L3-4 and L4-5. No acute findings. IMPRESSION: 1. No acute abnormalities involving the abdomen or pelvis. 2. Normal-appearing small bowel loops LATERAL to the descending colon in the LEFT UPPER QUADRANT, possibly indicating an internal hernia. No evidence of small bowel obstruction. 3. Descending and sigmoid colon diverticulosis without evidence of acute diverticulitis. 4. Moderately large hiatal hernia, unchanged. 5. Non-obstructing LEFT mid renal calculus measuring approximately 3 mm. 6. Moderate to severe multifactorial spinal stenosis at L2-3, L3-4 and L4-5. Aortic Atherosclerosis (ICD10-I70.0). Electronically Signed   By: Hulan Saashomas  Lawrence M.D.   On: 02/04/2020 09:55    Procedures Procedures (including critical care time)  Medications Ordered in ED Medications  iohexol (OMNIPAQUE) 300 MG/ML solution 75 mL (75 mLs  Intravenous Contrast Given 02/09/20 1446)  dicyclomine (BENTYL) capsule 10 mg (10 mg Oral Given 02/09/20 1554)  HYDROmorphone (DILAUDID) injection 0.5 mg (0.5 mg Intramuscular Given 02/09/20 1653)    ED Course  I have reviewed the triage vital signs and the nursing notes.  Pertinent labs & imaging results that were available during my care of the patient were reviewed by me and considered in my medical decision making (see chart  for details).    MDM Rules/Calculators/A&P                          74 year old female with abdominal pain.  Appears to be more of a chronic issue.  Doubt emergent process.  Reassured.  Outpatient follow-up. Final Clinical Impression(s) / ED Diagnoses Final diagnoses:  Generalized abdominal pain    Rx / DC Orders ED Discharge Orders         Ordered    HYDROcodone-acetaminophen (NORCO/VICODIN) 5-325 MG tablet  Every 8 hours PRN     Discontinue  Reprint     02/09/20 1624           Raeford Razor, MD 02/15/20 1246

## 2020-02-16 ENCOUNTER — Encounter: Payer: Self-pay | Admitting: Orthopedic Surgery

## 2020-02-16 ENCOUNTER — Other Ambulatory Visit: Payer: Self-pay

## 2020-02-16 ENCOUNTER — Ambulatory Visit: Payer: Medicare Other

## 2020-02-16 ENCOUNTER — Ambulatory Visit (INDEPENDENT_AMBULATORY_CARE_PROVIDER_SITE_OTHER): Payer: Medicare Other | Admitting: Orthopedic Surgery

## 2020-02-16 DIAGNOSIS — M79671 Pain in right foot: Secondary | ICD-10-CM | POA: Diagnosis not present

## 2020-02-16 DIAGNOSIS — S92344A Nondisplaced fracture of fourth metatarsal bone, right foot, initial encounter for closed fracture: Secondary | ICD-10-CM

## 2020-02-16 DIAGNOSIS — S72002D Fracture of unspecified part of neck of left femur, subsequent encounter for closed fracture with routine healing: Secondary | ICD-10-CM

## 2020-02-16 DIAGNOSIS — S92354A Nondisplaced fracture of fifth metatarsal bone, right foot, initial encounter for closed fracture: Secondary | ICD-10-CM | POA: Diagnosis not present

## 2020-02-16 NOTE — Progress Notes (Signed)
Sharon Jordan  02/16/2020  There is no height or weight on file to calculate BMI.   S: Chief Complaint  Patient presents with  . Post-op Follow-up    10/28/19 right hip fracture had previous surgery left hip at [facility] needed xray today   . Foot Pain    right foot bruising painful to weight bear    74 year old status post greater trochanteric hip fracture treated nonoperatively also had a left hip gamma nail which was revised to a left leg plate at [facility] and we have agreed to follow that until healing  Patient complains of new problem pain right foot after a fall cannot bear weight No complaints on the right side O: There were no vitals taken for this visit.  Physical Exam  Right foot exam tenderness at second third fourth and fifth metatarsals no gross deformity he does have some bruising under the skin and difficulty moving her toes color and capillary refill are normal no sensory deficits   MEDICAL DECISION MAKING  Encounter Diagnoses  Name Primary?  . Pain in right foot Yes  . Closed left hip fracture, with routine healing, subsequent encounter   . Closed nondisplaced fracture of fifth metatarsal bone of right foot, initial encounter   . Closed nondisplaced fracture of fourth metatarsal bone of right foot, initial encounter       IMAGING: Independent interpretation of images: Images of the left hip show a blade plate in good position with fracture healing  X-ray of the foot shows abnormality of the second third and fourth and fifth metatarsals   Outside records reviewed: no  MANAGEMENT   Hard soled shoe attempted first if not we will have to go to a cam walker.  WBAT with walker   X-rays in 6 weeks   No orders of the defined types were placed in this encounter.     Fuller Canada, MD  02/16/2020 11:34 AM

## 2020-02-23 ENCOUNTER — Encounter: Payer: Self-pay | Admitting: Internal Medicine

## 2020-03-02 ENCOUNTER — Telehealth (HOSPITAL_COMMUNITY): Payer: Self-pay | Admitting: Psychiatry

## 2020-03-02 ENCOUNTER — Encounter (HOSPITAL_COMMUNITY): Payer: Self-pay | Admitting: *Deleted

## 2020-03-02 ENCOUNTER — Emergency Department (HOSPITAL_COMMUNITY)
Admission: EM | Admit: 2020-03-02 | Discharge: 2020-03-03 | Disposition: A | Discharge status: Home / Self Care | Payer: Medicare Other | Attending: Emergency Medicine | Admitting: Emergency Medicine

## 2020-03-02 DIAGNOSIS — R1084 Generalized abdominal pain: Secondary | ICD-10-CM

## 2020-03-02 DIAGNOSIS — Z20822 Contact with and (suspected) exposure to covid-19: Secondary | ICD-10-CM | POA: Diagnosis not present

## 2020-03-02 DIAGNOSIS — Z87891 Personal history of nicotine dependence: Secondary | ICD-10-CM | POA: Diagnosis not present

## 2020-03-02 DIAGNOSIS — R45851 Suicidal ideations: Secondary | ICD-10-CM

## 2020-03-02 DIAGNOSIS — Z7951 Long term (current) use of inhaled steroids: Secondary | ICD-10-CM | POA: Diagnosis not present

## 2020-03-02 DIAGNOSIS — I10 Essential (primary) hypertension: Secondary | ICD-10-CM | POA: Diagnosis not present

## 2020-03-02 DIAGNOSIS — J449 Chronic obstructive pulmonary disease, unspecified: Secondary | ICD-10-CM | POA: Diagnosis not present

## 2020-03-02 DIAGNOSIS — F331 Major depressive disorder, recurrent, moderate: Secondary | ICD-10-CM | POA: Insufficient documentation

## 2020-03-02 DIAGNOSIS — R109 Unspecified abdominal pain: Secondary | ICD-10-CM | POA: Diagnosis present

## 2020-03-02 LAB — COMPREHENSIVE METABOLIC PANEL WITH GFR
ALT: 11 U/L (ref 0–44)
AST: 13 U/L — ABNORMAL LOW (ref 15–41)
Albumin: 3.5 g/dL (ref 3.5–5.0)
Alkaline Phosphatase: 76 U/L (ref 38–126)
Anion gap: 9 (ref 5–15)
BUN: 8 mg/dL (ref 8–23)
CO2: 31 mmol/L (ref 22–32)
Calcium: 8.7 mg/dL — ABNORMAL LOW (ref 8.9–10.3)
Chloride: 94 mmol/L — ABNORMAL LOW (ref 98–111)
Creatinine, Ser: 0.6 mg/dL (ref 0.44–1.00)
GFR calc Af Amer: >60 mL/min
GFR calc non Af Amer: >60 mL/min
Glucose, Bld: 110 mg/dL — ABNORMAL HIGH (ref 70–99)
Potassium: 4.1 mmol/L (ref 3.5–5.1)
Sodium: 134 mmol/L — ABNORMAL LOW (ref 135–145)
Total Bilirubin: 0.6 mg/dL (ref 0.3–1.2)
Total Protein: 6.5 g/dL (ref 6.5–8.1)

## 2020-03-02 LAB — CBC WITH DIFFERENTIAL/PLATELET
Abs Immature Granulocytes: 0.03 10*3/uL (ref 0.00–0.07)
Basophils Absolute: 0 10*3/uL (ref 0.0–0.1)
Basophils Relative: 0 %
Eosinophils Absolute: 0.1 10*3/uL (ref 0.0–0.5)
Eosinophils Relative: 2 %
HCT: 33.6 % — ABNORMAL LOW (ref 36.0–46.0)
Hemoglobin: 10.4 g/dL — ABNORMAL LOW (ref 12.0–15.0)
Immature Granulocytes: 1 %
Lymphocytes Relative: 9 %
Lymphs Abs: 0.4 10*3/uL — ABNORMAL LOW (ref 0.7–4.0)
MCH: 29.8 pg (ref 26.0–34.0)
MCHC: 31 g/dL (ref 30.0–36.0)
MCV: 96.3 fL (ref 80.0–100.0)
Monocytes Absolute: 0.4 10*3/uL (ref 0.1–1.0)
Monocytes Relative: 8 %
Neutro Abs: 4.1 10*3/uL (ref 1.7–7.7)
Neutrophils Relative %: 80 %
Platelets: 265 10*3/uL (ref 150–400)
RBC: 3.49 MIL/uL — ABNORMAL LOW (ref 3.87–5.11)
RDW: 13.8 % (ref 11.5–15.5)
WBC: 5.1 10*3/uL (ref 4.0–10.5)
nRBC: 0 % (ref 0.0–0.2)

## 2020-03-02 LAB — LIPASE, BLOOD: Lipase: 21 U/L (ref 11–51)

## 2020-03-02 LAB — POC OCCULT BLOOD, ED: Fecal Occult Bld: NEGATIVE

## 2020-03-02 LAB — CBG MONITORING, ED: Glucose-Capillary: 106 mg/dL — ABNORMAL HIGH (ref 70–99)

## 2020-03-02 LAB — ETHANOL: Alcohol, Ethyl (B): <10 mg/dL (ref <10)

## 2020-03-02 LAB — SARS CORONAVIRUS 2 BY RT PCR (HOSPITAL ORDER, PERFORMED IN ~~LOC~~ HOSPITAL LAB): SARS Coronavirus 2: NEGATIVE

## 2020-03-02 MED ORDER — ALUM & MAG HYDROXIDE-SIMETH 200-200-20 MG/5ML PO SUSP
30.0000 mL | Freq: Once | ORAL | Status: AC
  Administered 2020-03-02: 30 mL via ORAL
  Filled 2020-03-02: qty 30

## 2020-03-02 MED ORDER — PANTOPRAZOLE SODIUM 40 MG PO TBEC
40.0000 mg | DELAYED_RELEASE_TABLET | Freq: Every day | ORAL | Status: DC
  Administered 2020-03-02 – 2020-03-03 (×2): 40 mg via ORAL
  Filled 2020-03-02 (×2): qty 1

## 2020-03-02 MED ORDER — HYDROCODONE-ACETAMINOPHEN 5-325 MG PO TABS
1.0000 | ORAL_TABLET | Freq: Once | ORAL | Status: AC
  Administered 2020-03-02: 1 via ORAL
  Filled 2020-03-02: qty 1

## 2020-03-02 MED ORDER — LIDOCAINE VISCOUS HCL 2 % MT SOLN
15.0000 mL | Freq: Once | OROMUCOSAL | Status: AC
  Administered 2020-03-02: 15 mL via ORAL
  Filled 2020-03-02: qty 15

## 2020-03-02 MED ORDER — DICYCLOMINE HCL 10 MG PO CAPS
10.0000 mg | ORAL_CAPSULE | Freq: Once | ORAL | Status: AC
  Administered 2020-03-02: 10 mg via ORAL
  Filled 2020-03-02: qty 1

## 2020-03-02 MED ORDER — ROPINIROLE HCL 0.25 MG PO TABS
0.2500 mg | ORAL_TABLET | Freq: Every day | ORAL | Status: DC
  Administered 2020-03-03: 0.25 mg via ORAL
  Filled 2020-03-02 (×3): qty 1

## 2020-03-02 NOTE — Telephone Encounter (Signed)
Called to schedule appt. From referral patient not available

## 2020-03-02 NOTE — ED Notes (Signed)
Call to TTS after inquiry by PA  They report pt will be called second  When they catch up with walkins

## 2020-03-02 NOTE — BH Assessment (Signed)
Comprehensive Clinical Assessment (CCA) Screening, Triage and Referral Note  03/02/2020 Prim Morace [id number]  Visit Diagnosis: F33.1, Major depressive disorder, Recurrent episode, Moderate    ICD-10-CM   1. Generalized abdominal pain  R10.84   2. Suicidal ideation  R45.851    Sharon Jordan is a 74 year old patient who was brought to [facility] due to ongoing abdominal pain that she has been experiencing for 3-4 weeks. Pt shares she has tried multiple medications and home remedies in an effort to stop the pain but that nothing has assisted in relieving the pain she's experiencing. Pt stated she experienced something similar when she was in her 57's but states that pain only lasted for a short while and she doesn't remember what was wrong/what was done to assist in the pain.   Pt expresses feeling depressed since November 2020; she shares she fell and broke her hip and, once released from the hospital, she was d/c to her son's home. While staying at her son's home, she fell again and broke the same hip. She states that, after some time, she was finally able to return to her own home and she again fell and broke her other hip. Pt states she's spent more time in the hospital since November than she's spent in her own home, which has been difficult.  Pt acknowledged experiencing SI and that she had thought of taking all of her medication 2 days ago but denies she continues to feel that way or that she attempted to harm herself. She acknowledges she attempted to kill herself in her 43's or 30's by o/d on pills. Pt expresses she could return home and be safe if she were to return home. Pt denies HI, AVH, NSSIB, access to guns/weapons, engagement with the legal system, or SA.  Pt's protective factors include her son's involvement in her life, her consistent housing, and her lack of HI and AVH.  Pt provided verbal consent for contact to be made with her daughter, Sharon Jordan, and daughter-in-law, Sharon Jordan.  Pt is oriented x5. Her recent and remote memory is intact. Pt was cooperative throughout the assessment process. Pt's insight, judgement, and impulse control is fair at this time.   Patient Reported Information How did you hear about [place]? Other (Comment) (Pt's EDP)   Referral name: Berle Mull, PA-C   Referral phone number: No data recorded Whom do you see for routine medical problems? Primary Care   Practice/Facility Name: [facility]   Practice/Facility Phone Number: No data recorded  Name of Contact: [facility]   Contact Number: [facility]   Contact Fax Number: [facility]   Prescriber Name: [facility]   Prescriber Address (if known): [facility]  What Is the Reason for Your Visit/Call Today? Pt has been experiencing abdominal pain for one month and, due to an inability to curb the pain, noted she had considered taking all of her medication 2 days ago to end her life.  How Long Has This Been Causing You Problems? 1 wk - 1 month  Have You Recently Been in Any Inpatient Treatment (Hospital/Detox/Crisis Center/28-Day Program)? No   Name/Location of Program/Hospital:No data recorded  How Long Were You There? No data recorded  When Were You Discharged? No data recorded Have You Ever Received Services From [facility] Before? No   Who Do You See at [facility]? No data recorded Have You Recently Had Any Thoughts About Hurting Yourself? Yes   Are You Planning to Commit Suicide/Harm Yourself At This time?  No  Have you  Recently Had Thoughts About Hurting Someone Sharon Jordan? No   Explanation: No data recorded Have You Used Any Alcohol or Drugs in the Past 24 Hours? No   How Long Ago Did You Use Drugs or Alcohol?  No data recorded  What Did You Use and How Much? No data recorded What Do You Feel Would Help You the Most Today? Other (Comment);Therapy (Pt has a plan to start seeing a therapist.)  Do You Currently Have a Therapist/Psychiatrist? No   Name of Therapist/Psychiatrist: No data recorded  Have You  Been Recently Discharged From Any Office Practice or Programs? No   Explanation of Discharge From Practice/Program:  No data recorded    CCA Screening Triage Referral Assessment Type of Contact: Tele-Assessment   Is this Initial or Reassessment? Initial Assessment   Date Telepsych consult ordered in [facility]:  03/02/20   Time Telepsych consult ordered in [facility]:  1421  Patient Reported Information Reviewed? Yes   Patient Left Without Being Seen? No data recorded  Reason for Not Completing Assessment: No data recorded Collateral Involvement: Pt provided verbal consent for clinician to contact her daughter, Sharon Jordan, and her daughter-in-law, Quiara Killian, for collateral information.  Does Patient Have a Automotive engineer Guardian? No data recorded  Name and Contact of Legal Guardian:  No data recorded If Minor and Not Living with Parent(s), Who has Custody? N/A  Is CPS involved or ever been involved? Never  Is APS involved or ever been involved? Never  Patient Determined To Be At Risk for Harm To Self or Others Based on Review of Patient Reported Information or Presenting Complaint? No   Method: No data recorded  Availability of Means: No data recorded  Intent: No data recorded  Notification Required: No data recorded  Additional Information for Danger to Others Potential:  No data recorded  Additional Comments for Danger to Others Potential:  No data recorded  Are There Guns or Other Weapons in Your Home?  No data recorded   Types of Guns/Weapons: No data recorded   Are These Weapons Safely Secured?                              No data recorded   Who Could Verify You Are Able To Have These Secured:    No data recorded Do You Have any Outstanding Charges, Pending Court Dates, Parole/Probation? No data recorded Contacted To Inform of Risk of Harm To Self or Others: No data recorded Location of Assessment: [facility]  Does Patient Present under Involuntary Commitment? No   IVC  Papers Initial File Date: No data recorded  [place] of Residence: [place]  Patient Currently Receiving the Following Services: Not Receiving Services   Determination of Need: No data recorded  Options For Referral: Other: Comment (Pt will be observed overnight for safety and stability and be re-assessed in the morning.) Elenore Paddy, NP, reviewed pt's chart and information and determined pt should be observed overnight and re-assessed in the morning by psychiatry. This information was provided to pt's provider and nurse at 2148.   Ralph Dowdy, LMFT

## 2020-03-02 NOTE — ED Notes (Signed)
Pt is unable to give urine sample at this time Pt was given water

## 2020-03-02 NOTE — ED Triage Notes (Signed)
Abdominal pain with vomiting and diarrhea

## 2020-03-02 NOTE — ED Provider Notes (Signed)
[facility] EMERGENCY DEPARTMENT Provider Note   CSN: [id number] Arrival date & time: 03/02/20  1156     History Chief Complaint  Patient presents with  . Abdominal Pain  . V70.1    Sharon Jordan is a 74 y.o. female.  HPI   Patient presents to the emergency department with chief complaint of abdominal pain that has been going on for the last 3 weeks.  Patient states the pain is episodic, it feels like a crampy pain that does not radiate.  She states the pain feels better when she passes gas and has a bowel movement but lately she feels like she has had a more difficult time with passing gas.  She has tried drinking soda, vinegar water and tried 1 day of MiraLAX without relief.  Patient has been seen here multiple times for abdominal pain, patient had multiple CT abdomen pelvis that did not show acute abnormalities as well as lab work that is unremarkable.  Patient is also here because she had thoughts of ending her life by taking all of her pills.  She has no history of suicidal attempts, denies hallucinations, delusions, homicidal thoughts.  Patient has significant medical history of anemia, COPD, depression, hypertension, lupus.  Patient denies headache, fever, chills, shortness of breath, chest pain, nausea, vomiting, dysuria, pedal edema.  Past Medical History:  Diagnosis Date  . Anemia   . At risk for delirium   . Closed fracture of anterior column of left acetabulum (HCC)   . Closed fracture of pelvis (HCC)   . COPD (chronic obstructive pulmonary disease) (HCC)   . Depression   . History of rheumatic fever   . HTN (hypertension)   . Hyponatremia   . Lupus (HCC)   . RLS (restless legs syndrome)     Patient Active Problem List   Diagnosis Date Noted  . Abdominal pain 02/04/2020  . Enterocolitis 12/26/2019  . Closed trochanteric fracture of right femur with routine healing 10/28/19 11/13/2019  . Chronic respiratory failure with hypoxia (HCC)   . Anxiety   . Hip  fracture (HCC) 10/27/2019  . Intertrochanteric fracture of left femur (HCC) 09/05/2019  . Fracture of acetabulum, left, closed (HCC) 09/05/2019  . Hypertension 09/05/2019  . COPD (chronic obstructive pulmonary disease) (HCC) 09/05/2019  . Depression 09/05/2019  . GERD (gastroesophageal reflux disease) 09/05/2019  . RLS (restless legs syndrome) 09/05/2019  . Anemia 08/26/2019  . At risk for delirium 08/26/2019  . Hyponatremia 08/26/2019  . Closed fracture of pelvis (HCC) 08/23/2019    Past Surgical History:  Procedure Laterality Date  . ABDOMINAL HYSTERECTOMY    . APPENDECTOMY    . 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 CLOSED MANIPULATION AND CLOSED TREATMENT OF LEFT ACETABULUM FRACTURE, BOTH COLUMNS Left   . HIP SURGERY Left   . JOINT REPLACEMENT    . REMOVAL OF DEEP HARDWARE, LEFT INTRAMEDULLARY NAIL Left    TFN  . 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 TONSILLECTOMY       OB History    Gravida  3   Para      Term      Preterm      AB      Living  2     SAB      TAB      Ectopic      Multiple      Live Births  No family history on file.  Social History   Tobacco Use  . Smoking status: Former Games developer  . Smokeless tobacco: Never Used  Vaping Use  . Vaping Use: Never used  Substance Use Topics  . Alcohol use: Never  . Drug use: Never    Home Medications Prior to Admission medications   Medication Sig Start Date End Date Taking? Authorizing Provider  albuterol (PROVENTIL) (2.5 MG/3ML) 0.083% nebulizer solution Take 3 mLs (2.5 mg total) by nebulization every 8 (eight) hours as needed for wheezing or shortness of breath. 01/20/20   Roena Malady, PA-C  aspirin EC 81 MG tablet Take 81 mg by mouth 2 (two) times daily. QAM and QHS    [provider]  Budeson-Glycopyrrol-Formoterol (BREZTRI AEROSPHERE) 160-9-4.8 MCG/ACT AERO Inhale 1 puff into the lungs  daily. Patient not taking: Reported on 02/16/2020 01/20/20   Roena Malady, PA-C  buPROPion (WELLBUTRIN XL) 300 MG 24 hr tablet Take 1 tablet (300 mg total) by mouth daily. Patient not taking: Reported on 02/16/2020 01/20/20   Roena Malady, PA-C  calcium carbonate (TUMS - DOSED IN MG ELEMENTAL CALCIUM) 500 MG chewable tablet Chew 1 tablet by mouth 3 (three) times daily with meals.  Patient not taking: Reported on 02/16/2020    [provider]  carboxymethylcellulose (ARTIFICIAL TEARS) 1 % ophthalmic solution Place 2 drops into both eyes 3 (three) times daily. Instill 2 drops both eyes three times a day as needed for dry eyes Patient not taking: Reported on 02/16/2020 10/22/19   Roena Malady, PA-C  Cholecalciferol (VITAMIN D-3) 125 MCG (5000 UT) TABS Take 5,000 Units by mouth daily. Patient not taking: Reported on 02/16/2020 01/16/20   Bufford Spikes L, DO  DULoxetine (CYMBALTA) 30 MG capsule Take 2 capsules (60 mg total) by mouth daily. Patient not taking: Reported on 02/16/2020 01/20/20   Roena Malady, PA-C  feeding supplement, ENSURE ENLIVE, (ENSURE ENLIVE) LIQD Take 237 mLs by mouth 2 (two) times daily between meals. 01/03/20   Sherryll Burger, Pratik D, DO  gabapentin (NEURONTIN) 100 MG capsule Take 1 capsule (100 mg total) by mouth 2 (two) times daily. Patient not taking: Reported on 02/16/2020 01/20/20   Roena Malady, PA-C  guaiFENesin (MUCINEX) 600 MG 12 hr tablet Take 2 tablets (1,200 mg total) by mouth 2 (two) times daily as needed for cough or to loosen phlegm. 01/06/20   Reed, Tiffany L, DO  HYDROcodone-acetaminophen (NORCO/VICODIN) 5-325 MG tablet Take 1 tablet by mouth every 8 (eight) hours as needed. 02/09/20   Raeford Razor, MD  hydrOXYzine (VISTARIL) 25 MG capsule Take 1 capsule (25 mg total) by mouth 3 (three) times daily as needed. Patient not taking: Reported on 02/16/2020 02/07/20   Erick Blinks, MD  hyoscyamine (LEVSIN SL) 0.125 MG SL tablet Take 1 tablet (0.125 mg total) by mouth every  8 (eight) hours as needed for cramping. Patient not taking: Reported on 02/16/2020 01/20/20   Roena Malady, PA-C  linaclotide [facility]) 145 MCG CAPS capsule Take 1 capsule (145 mcg total) by mouth daily before breakfast. Patient not taking: Reported on 02/16/2020 01/20/20   Roena Malady, PA-C  magnesium hydroxide (MILK OF MAGNESIA) 400 MG/5ML suspension Take 30 mLs by mouth daily as needed for moderate constipation. Patient not taking: Reported on 02/16/2020 10/31/19   Vassie Loll, MD  oxyCODONE (OXY IR/ROXICODONE) 5 MG immediate release tablet Take 5 mg by mouth 3 (three) times daily. 02/11/20   [provider]  pantoprazole (PROTONIX) 40  MG tablet Take 1 tablet (40 mg total) by mouth daily. Patient not taking: Reported on 02/16/2020 01/20/20   Roena Malady, PA-C  psyllium (METAMUCIL) 58.6 % packet Take 1 packet by mouth at bedtime. Patient not taking: Reported on 02/16/2020    [provider]  rOPINIRole (REQUIP) 0.25 MG tablet Take 1 tablet (0.25 mg total) by mouth at bedtime. 01/20/20   Roena Malady, PA-C  simethicone (MYLICON) 80 MG chewable tablet Chew 1 tablet (80 mg total) by mouth 3 (three) times daily as needed. Patient not taking: Reported on 02/16/2020 01/20/20   Roena Malady, PA-C    Allergies    Amoxicillin, Cephalosporins, Clindamycin/lincomycin, Doxycycline, and Penicillins  Review of Systems   Review of Systems  Constitutional: Negative for chills and fever.  HENT: Negative for congestion, sore throat and trouble swallowing.   Eyes: Negative for visual disturbance.  Respiratory: Negative for cough and shortness of breath.   Cardiovascular: Negative for chest pain and palpitations.  Gastrointestinal: Positive for abdominal pain and diarrhea. Negative for nausea and vomiting.  Genitourinary: Negative for enuresis and flank pain.  Musculoskeletal: Negative for back pain.  Skin: Negative for rash.  Neurological: Negative for dizziness and headaches.   Hematological: Does not bruise/bleed easily.    Physical Exam Updated Vital Signs BP (!) 154/80 (BP Location: Right Arm)   Pulse 94   Temp 98.5 F (36.9 C) (Oral)   Resp 18   Ht 5' (1.524 m)   Wt 49.9 kg   SpO2 94%   BMI 21.48 kg/m   Physical Exam Vitals and nursing note reviewed.  Constitutional:      General: She is not in acute distress.    Appearance: She is not ill-appearing.  HENT:     Head: Normocephalic and atraumatic.     Nose: No congestion.     Mouth/Throat:     Mouth: Mucous membranes are moist.     Pharynx: Oropharynx is clear.  Eyes:     General: No scleral icterus. Cardiovascular:     Rate and Rhythm: Normal rate and regular rhythm.     Pulses: Normal pulses.     Heart sounds: No murmur heard.  No friction rub. No gallop.   Pulmonary:     Effort: No respiratory distress.     Breath sounds: No wheezing, rhonchi or rales.  Abdominal:     General: There is no distension.     Tenderness: There is no abdominal tenderness. There is no guarding.     Comments: Patient's abdomen was visualized, nondistended, normal active bowel sounds, dull to percussion, slight tender to palpation in her umbilicus, negative Murphy sign, negative McBurney point, no signs of acute abdomen noted.  Musculoskeletal:        General: No swelling.  Skin:    General: Skin is warm and dry.     Capillary Refill: Capillary refill takes less than 2 seconds.     Findings: No rash.  Neurological:     Mental Status: She is alert and oriented to person, place, and time.  Psychiatric:        Mood and Affect: Mood normal.     ED Results / Procedures / Treatments   Labs (all labs ordered are listed, but only abnormal results are displayed) Labs Reviewed  COMPREHENSIVE METABOLIC PANEL - Abnormal; Notable for the following components:      Result Value   Sodium 134 (*)    Chloride 94 (*)    Glucose, Bld  110 (*)    Calcium 8.7 (*)    AST 13 (*)    All other components within normal  limits  CBC WITH DIFFERENTIAL/PLATELET - Abnormal; Notable for the following components:   RBC 3.49 (*)    Hemoglobin 10.4 (*)    HCT 33.6 (*)    Lymphs Abs 0.4 (*)    All other components within normal limits  CBG MONITORING, ED - Abnormal; Notable for the following components:   Glucose-Capillary 106 (*)    All other components within normal limits  SARS CORONAVIRUS 2 BY RT PCR (HOSPITAL ORDER, PERFORMED IN [facility])  LIPASE, BLOOD  ETHANOL  URINALYSIS, ROUTINE W REFLEX MICROSCOPIC  RAPID URINE DRUG SCREEN, HOSP PERFORMED  OCCULT BLOOD X 1 CARD TO LAB, STOOL  POC OCCULT BLOOD, ED    EKG None  Radiology No results found.  Procedures Procedures (including critical care time)  Medications Ordered in ED Medications  rOPINIRole (REQUIP) tablet 0.25 mg (has no administration in time range)  pantoprazole (PROTONIX) EC tablet 40 mg (40 mg Oral Given 03/02/20 2043)  alum & mag hydroxide-simeth (MAALOX/MYLANTA) 200-200-20 MG/5ML suspension 30 mL (30 mLs Oral Given 03/02/20 1337)    And  lidocaine (XYLOCAINE) 2 % viscous mouth solution 15 mL (15 mLs Oral Given 03/02/20 1337)  dicyclomine (BENTYL) capsule 10 mg (10 mg Oral Given 03/02/20 1705)    ED Course  I have reviewed the triage vital signs and the nursing notes.  Pertinent labs & imaging results that were available during my care of the patient were reviewed by me and considered in my medical decision making (see chart for details).    MDM Rules/Calculators/A&P                          I have personally reviewed all imaging, labs and have interpreted them.  On exam patient was alert and oriented, showing no acute signs distress, she admits that she has some abdominal pain but states she is feeling better.  Physical exam did not show an acute abdomen.  Patient denies homicidal or suicidal thoughts at this time but does endorse that she thought about taking all of her pills 2 days ago because she wanted to end her  life.  She denies delusions, hallucinations.  Will order screening exams and consult TTS for psych evaluation.  Patient will be given a GI cocktail and Bentyl for abdominal pain.  I have low suspicion for a bowel obstruction, diverticulitis, colitis, appendicitis as abdominal exam was benign no signs of an acute abdomen, it was nondistended, dull to percussion, no rebound tenderness, no Rovsing or McBurney. Patient CBC did show microcytic anemia and has decreased a little, performed Hemoccult which came back negative making GI bleed unlikely at this time. Will defer further imaging as she has had 4 CT scans done in the last 2 months, last one was 2 weeks ago that did not show  acute findings.  I have low suspicion for pancreatitis as she denies epigastric pain, has low risk factors, lipase was 21.  Unlikely patient suffering from acute hepatitis as liver enzymes were not elevated she had no right upper quadrant pain.  Unlikely patient suffering from biliary disease as there is no right upper quadrant pain, no Murphy sign, no elevated liver enzymes, well as total bili were within normal limits.  Low suspicion for systemic infection as patient CBC does not show leukocytosis, patient is nontoxic vital signs  reassuring no obvious signs of infection noted.    Patient will be handed off to [employer] she was provided HPI, current work-up as well as likely discharge.  As long as patient's UA and patient is cleared by TTS patient can be discharged home.  Likely patient's abdominal pain is chronic in nature different diagnosis includes constipation, gastroparesis, IBS.  Will place patient on laxatives and recommend she follows up with GI for further evaluation. Final Clinical Impression(s) / ED Diagnoses Final diagnoses:  Generalized abdominal pain  Suicidal ideation    Rx / DC Orders ED Discharge Orders    None       Barnie Del 03/02/20 2133    Mancel Bale, MD 03/03/20  [phone]

## 2020-03-02 NOTE — ED Triage Notes (Signed)
States she is tired of being sick and was awaiting another day to see if pain stopped. States she plans to take all her pills.

## 2020-03-02 NOTE — ED Notes (Signed)
Call by son Mr Shona Simpson [phone]   He reports mother has had anxiety and depression for a long time   Has no outpt provider   He has tried to encourage her to not live alone but he cannot convince her   He has questions regarding her condition   He is referred to PA for answers to questions

## 2020-03-02 NOTE — ED Notes (Signed)
Sharon Jordan [phone]

## 2020-03-02 NOTE — ED Provider Notes (Signed)
  Face-to-face evaluation   History: She presents for evaluation of abdominal pain.  She reports diarrhea but no vomiting.  She states this is a recurrent problem, and has happened twice in the last month.  She denies problems at home today.  Physical exam: Elderly, alert and cooperative.  Abdomen soft and mildly tender to palpation.  No rebound tenderness.  Patient is lucid.  Medical screening examination/treatment/procedure(s) were conducted as a shared visit with non-physician practitioner(s) and myself.  I personally evaluated the patient during the encounter    Mancel Bale, MD 03/03/20 [phone]

## 2020-03-02 NOTE — Discharge Instructions (Addendum)
You have been seen here for abdominal pain and suicidal ideation.  I recommend that you take MiraLAX for the next 2 weeks, please take this every day and follow dosing on the back of bottle.  I need you to drink plenty of water as this will help with constipation and promote regular bowel movements.  Please eat foods that have plenty of fiber in it like vegetables and and fruits, nuts, grains as this will also help promote healthy bowel movements.   I have given you contact information for a GI specialist would like you to call them at your earliest convenience as I feel further work-up is needed.  I want to come back to emerge department if you develop severe abdominal pain, uncontrolled nausea, vomiting, diarrhea, chest pain, shortness of breath as these symptoms require further evaluation management.

## 2020-03-02 NOTE — ED Provider Notes (Signed)
Care assumed from Berle Mull, PA-C at shift change with TTS and UA pending.   In brief, this patient is a 74 y.o. F presents for evaluation of abdominal pain.  She reports this been ongoing for the last 3 weeks.  Patient has been seen here multiple times in the ED for abdominal pain.  States that this feels similar.  She states that the pain feels better when she passes gas and has a bowel movement.  She has been trying vinegar water and MiraLAX with minimal improvement in symptoms.  She also reported some suicidal ideations.  She states that this was a fleeting thought that she had this morning and states that she thought about taking all her pills to kill herself because of the chronic abdominal pain that she has.  Please see note from previous provider for full history/physical exam.   Physical Exam  BP (!) 154/80 (BP Location: Right Arm)   Pulse 94   Temp 98.5 F (36.9 C) (Oral)   Resp 18   Ht 5' (1.524 m)   Wt 49.9 kg   SpO2 94%   BMI 21.48 kg/m   Physical Exam   Abdomen is soft, nondistended.  Mild tenderness noted generally.  No focal point.  ED Course/Procedures     Procedures   Results for orders placed or performed during the hospital encounter of 03/02/20 (from the past 24 hour(s))  Ethanol     Status: None   Collection Time: 03/02/20  1:08 PM  Result Value Ref Range   Alcohol, Ethyl (B) <10 <10 mg/dL  Lipase, blood     Status: None   Collection Time: 03/02/20  1:22 PM  Result Value Ref Range   Lipase 21 11 - 51 U/L  Comprehensive metabolic panel     Status: Abnormal   Collection Time: 03/02/20  1:22 PM  Result Value Ref Range   Sodium 134 (L) 135 - 145 mmol/L   Potassium 4.1 3.5 - 5.1 mmol/L   Chloride 94 (L) 98 - 111 mmol/L   CO2 31 22 - 32 mmol/L   Glucose, Bld 110 (H) 70 - 99 mg/dL   BUN 8 8 - 23 mg/dL   Creatinine, Ser 3.81 0.44 - 1.00 mg/dL   Calcium 8.7 (L) 8.9 - 10.3 mg/dL   Total Protein 6.5 6.5 - 8.1 g/dL   Albumin 3.5 3.5 - 5.0 g/dL   AST  13 (L) 15 - 41 U/L   ALT 11 0 - 44 U/L   Alkaline Phosphatase 76 38 - 126 U/L   Total Bilirubin 0.6 0.3 - 1.2 mg/dL   GFR calc non Af Amer >60 >60 mL/min   GFR calc Af Amer >60 >60 mL/min   Anion gap 9 5 - 15  CBC with Differential     Status: Abnormal   Collection Time: 03/02/20  1:22 PM  Result Value Ref Range   WBC 5.1 4.0 - 10.5 K/uL   RBC 3.49 (L) 3.87 - 5.11 MIL/uL   Hemoglobin 10.4 (L) 12.0 - 15.0 g/dL   HCT 82.9 (L) 36 - 46 %   MCV 96.3 80.0 - 100.0 fL   MCH 29.8 26.0 - 34.0 pg   MCHC 31.0 30.0 - 36.0 g/dL   RDW 93.7 16.9 - 67.8 %   Platelets 265 150 - 400 K/uL   nRBC 0.0 0.0 - 0.2 %   Neutrophils Relative % 80 %   Neutro Abs 4.1 1.7 - 7.7 K/uL   Lymphocytes Relative 9 %  Lymphs Abs 0.4 (L) 0.7 - 4.0 K/uL   Monocytes Relative 8 %   Monocytes Absolute 0.4 0 - 1 K/uL   Eosinophils Relative 2 %   Eosinophils Absolute 0.1 0 - 0 K/uL   Basophils Relative 0 %   Basophils Absolute 0.0 0 - 0 K/uL   Immature Granulocytes 1 %   Abs Immature Granulocytes 0.03 0.00 - 0.07 K/uL  SARS Coronavirus 2 by RT PCR (hospital order, performed in [facility] hospital lab) Nasopharyngeal Urine, Clean Catch     Status: None   Collection Time: 03/02/20  2:20 PM   Specimen: Urine, Clean Catch; Nasopharyngeal  Result Value Ref Range   SARS Coronavirus 2 NEGATIVE NEGATIVE  CBG monitoring, ED     Status: Abnormal   Collection Time: 03/02/20  4:19 PM  Result Value Ref Range   Glucose-Capillary 106 (H) 70 - 99 mg/dL  POC occult blood, ED     Status: None   Collection Time: 03/02/20  8:34 PM  Result Value Ref Range   Fecal Occult Bld NEGATIVE NEGATIVE   MDM    PLAN: Patient pending TTS consult.  MDM: Behavioral health is recommended overnight observation with reassessment in the morning.  Patient with history of depression and anxiety.  She does not have any outpatient resources.  Will likely need help in establishing outpatient resources to help with these concerns.  Discussed results  with patient.  She is voluntary at this time.  She still having some mild abdominal pain.  Will give additional analgesics.  Abdominal exam shows some generalized tenderness with no focal point.  No rigidity, guarding.  Patient will observed overnight.    1. Generalized abdominal pain   2. Suicidal ideation      Rosana Hoes 03/03/20 0001    Raeford Razor, MD 03/04/20 1919

## 2020-03-02 NOTE — ED Notes (Signed)
TTS 

## 2020-03-03 ENCOUNTER — Telehealth (HOSPITAL_COMMUNITY): Payer: Self-pay | Admitting: Psychiatry

## 2020-03-03 LAB — RAPID URINE DRUG SCREEN, HOSP PERFORMED
Amphetamines: NOT DETECTED
Barbiturates: NOT DETECTED
Benzodiazepines: NOT DETECTED
Cocaine: NOT DETECTED
Opiates: NOT DETECTED
Tetrahydrocannabinol: NOT DETECTED

## 2020-03-03 LAB — URINALYSIS, ROUTINE W REFLEX MICROSCOPIC
Bilirubin Urine: NEGATIVE
Glucose, UA: NEGATIVE mg/dL
Hgb urine dipstick: NEGATIVE
Ketones, ur: NEGATIVE mg/dL
Leukocytes,Ua: NEGATIVE
Nitrite: NEGATIVE
Protein, ur: NEGATIVE mg/dL
Specific Gravity, Urine: 1.009 (ref 1.005–1.030)
pH: 8 (ref 5.0–8.0)

## 2020-03-03 MED ORDER — DICYCLOMINE HCL 10 MG PO CAPS
10.0000 mg | ORAL_CAPSULE | Freq: Once | ORAL | Status: AC
  Administered 2020-03-03: 10 mg via ORAL
  Filled 2020-03-03: qty 1

## 2020-03-03 MED ORDER — BISACODYL 5 MG PO TBEC
5.0000 mg | DELAYED_RELEASE_TABLET | Freq: Once | ORAL | Status: AC
  Administered 2020-03-03: 5 mg via ORAL
  Filled 2020-03-03: qty 1

## 2020-03-03 MED ORDER — HYDROCODONE-ACETAMINOPHEN 5-325 MG PO TABS
1.0000 | ORAL_TABLET | Freq: Once | ORAL | Status: AC
  Administered 2020-03-03: 1 via ORAL
  Filled 2020-03-03: qty 1

## 2020-03-03 NOTE — Clinical Social Work Note (Signed)
Transition of Care [facility]) - Emergency Department Mini Assessment  Patient Details  Name: Sharon Jordan MRN: [id number] Date of Birth: 03/17/1946  [facility] of [facility]) CM/SW Contact:    Ewing Schlein, LCSW Phone Number: 03/03/2020, 4:31 PM  Clinical Narrative: Patient is a 74 year old female who presented to the ED for abdominal pain. Patient has also been psych cleared by TTS. CSW called patient's daughter, Sharon Jordan, to follow up with patient being picked up. Daughter reported the family will pick up the patient. CSW discussed outpatient palliative care for patient and daughter agreeable to referral. CSW spoke with EDP, Dr. Preston Fleeting, regarding palliative referral. CSW called [facility] with [facility] to make palliative referral.  CSW called Dr. Bing Matter office to follow up with OP referral made during patient's previous admission. CSW informed Dr. Bing Matter office is unable to schedule new patients until November and recommended the patient keep her appointment at [facility] on 03/30/20.  CSW updated daughter regarding Dr. Bing Matter schedule and the referral for palliative. TOC signing off.  ED Mini Assessment: What brought you to the Emergency Department? : Abdominal pain; psych assessment Barriers to Discharge: ED Barriers Resolved Barrier interventions: Referral to [facility] OP palliative Means of departure: Car Interventions which prevented an admission or readmission: Other (must enter comment) (Referral to outpatient palliative care)  Patient Contact and Communications Spoke with: Prudence Davidson Date: 03/03/20  Contact time: 1617 Call outcome: Referral made Patient states their goals for this hospitalization and ongoing recovery are:: Improve pain CMS Medicare.gov Compare Post Acute Care list provided to:: Patient Represenative (must comment) Sharon Jordan (daughter)) Choice offered to / list presented to : Adult Children  Admission diagnosis:  Abdominal Pain Patient Active  Problem List   Diagnosis Date Noted  . Abdominal pain 02/04/2020  . Enterocolitis 12/26/2019  . Closed trochanteric fracture of right femur with routine healing 10/28/19 11/13/2019  . Chronic respiratory failure with hypoxia (HCC)   . Anxiety   . Hip fracture (HCC) 10/27/2019  . Intertrochanteric fracture of left femur (HCC) 09/05/2019  . Fracture of acetabulum, left, closed (HCC) 09/05/2019  . Hypertension 09/05/2019  . COPD (chronic obstructive pulmonary disease) (HCC) 09/05/2019  . Depression 09/05/2019  . GERD (gastroesophageal reflux disease) 09/05/2019  . RLS (restless legs syndrome) 09/05/2019  . Anemia 08/26/2019  . At risk for delirium 08/26/2019  . Hyponatremia 08/26/2019  . Closed fracture of pelvis (HCC) 08/23/2019   PCP:  [name], MD Pharmacy:   [facility] [place], [place] - [address] [place] [address] Phone: [phone] Fax: [phone]

## 2020-03-03 NOTE — ED Notes (Signed)
TTS in progress 

## 2020-03-03 NOTE — ED Notes (Signed)
Megan with social work notified of pt psych cleared and family states concerns to [facility]. Social work will follow.

## 2020-03-03 NOTE — ED Notes (Signed)
Pt c/o abdominal pain. Dr Charm Barges made aware.

## 2020-03-03 NOTE — Telephone Encounter (Signed)
RN Called from Emergency Department advising patient has been psych cleared and will be discharged. Pt was called to make New patient appt on 03/02/20 and was advised by family pt was in the hospital. Advised RN New patient appts are scheduling out to November, RN advised to far off and that the patient will keep appt at [facility]. RN wanted to keep appt with [facility] advised unable to do so considering patient has accepted and scheduled with another facility and/ doctor.

## 2020-03-03 NOTE — ED Notes (Signed)
Pt checked and purewick/brief are dry at this time.

## 2020-03-03 NOTE — ED Provider Notes (Signed)
Patient cleared for discharge by behavioral health.  Social worker will provide outpatient resources for patient.  Family is coming to pick her up.   Dione Booze, MD 03/03/20 [phone]

## 2020-03-03 NOTE — ED Provider Notes (Signed)
Emergency Medicine Observation Re-evaluation Note  [facility] is a 74 y.o. female, seen on rounds today.  Pt initially presented to the [facility] for complaints of Abdominal Pain and V70.1 Currently, the patient is resting in bed.  Physical Exam  BP (!) 154/80 (BP Location: Right Arm)   Pulse 94   Temp 98.5 F (36.9 C) (Oral)   Resp 18   Ht 5' (1.524 m)   Wt 49.9 kg   SpO2 94%   BMI 21.48 kg/m  Physical Exam  [facility] Course / MDM  EKG:    I have reviewed the labs performed to date as well as medications administered while in observation.  Recent changes in the last 24 hours include medical workup and cleared. TTS eval and plan for reassess in am Plan  Current plan is for TTS reassessment. Patient is not under full IVC at this time.   Terrilee Files, MD 03/04/20 1012

## 2020-03-03 NOTE — Telephone Encounter (Signed)
Patient caregiver called to advised patient is in the hospital and has been evaluated by behavorial health - and is currently still in hospital.

## 2020-03-03 NOTE — Progress Notes (Signed)
Patient ID: [facility], female   DOB: Oct 15, 1945, 74 y.o.   MRN: [id number]   I spoke to [name] [phone] after she returned my call. I updated her on patients progress and discussed with her that patient currently did not meet criteria for psychiatric hospitalization. We further discussed that patient continued to have passive deaths thoughts which was secondary to her chronic abdominal pain. She stated that she was aware of patients chronic abdominal pain and added that patient had experienced thi [place] in the past and ended up having an intestinal infection  ( per review of chart, patient was recently treated for enterocolitis). She stated that she would like for patient to be tested to see of the infection had resurfaced. I further advised her that Dr. Neysa Hotter, psychiatry,  had been trying to schedule patient an appointment following a referral. She stated that she had not received that call. She added that patient has an appointment with [facility] for 03/30/2020 however, she was hoping to get a sooner appointment. She was advised reach back out. She stated that patient to has an appointment , 04/22/2020 with her GI doctor however, she was wanting for patient again to be tested for the intestinal infection before she is discharged from the ED. She was advised that this information would be forwarded. She was again advised that on a psychiatric standpoint, patient was psychiatrically cleared.

## 2020-03-03 NOTE — Progress Notes (Signed)
Patient ID: [facility], female   DOB: Mar 27, 1946, 74 y.o.   MRN: [id number]   Psychiatric reassessment   HPI: [facility] is a 74 year old patient who was brought to [facility] due to ongoing abdominal pain that she has been experiencing for 3-4 weeks. Pt shares she has tried multiple medications and home remedies in an effort to stop the pain but that nothing has assisted in relieving the pain she's experiencing. Pt stated she experienced something similar when she was in her 67's but states that pain only lasted for a short while and she doesn't remember what was wrong/what was done to assist in the pain.   Pt expresses feeling depressed since November 2020; she shares she fell and broke her hip and, once released from the hospital, she was d/c to her son's home. While staying at her son's home, she fell again and broke the same hip. She states that, after some time, she was finally able to return to her own home and she again fell and broke her other hip. Pt states she's spent more time in the hospital since November than she's spent in her own home, which has been difficult.  Pt acknowledged experiencing SI and that she had thought of taking all of her medication 2 days ago but denies she continues to feel that way or that she attempted to harm herself. She acknowledges she attempted to kill herself in her 16's or 30's by o/d on pills. Pt expresses she could return home and be safe if she were to return home. Pt denies HI, AVH, NSSIB, access to guns/weapons, engagement with the legal system, or SA.  Psychiatric evaluation: [facility] is a 74 year old female who presented to [facility] with chief complaint of abdominal pain. AT some point, she endorsed suicidal thoughts.  On evaluation today, patient was alert an oriented x4, calm and cooperative. She  continued to endorse generalized abdominal pain. She described the pain as chronic although reported for the past three weeks, the pain has  worsened. She stated that the pain is intermittent and worse on some days than others. She stated," who would want to live like this. No one wants to live there life in pain." She specifically stated," If I did not have this pain I could live my life and I wouldn't be thinking like this." In other words, patient stated that if she did not have the pain, she would not be having thoughts of not wanting to live. Despite  voicing passive SI secondary to chronic pain, she denied plan or intent to harm herself. She denied psychosis and did not appear to be responding to internal stimuli. She denied homicidal thoughts. She reported some depression due to ongoing pain. She did however state that she has a psychiatrist who manges her depression. She reported one suicide attempt in the very distant past (age 65; overdose on pills). Stated she was psychiatrically hospitalized at that time. She denied history of NSSIB, substance abuse or use, access to guns/weapons, engagement with the legal system.  She provided verbal consent for contact to be made with her daughter, [name] [phone] and although an attempt was made, attempt was unsuccessful. Voice message left for a return phone call.   Disposition: Patient denies current HI and hallucinations. She endorsed passive thoughts of death but denied plan or intent to harm herself. Her passive thoughts of death she reported is related to chronic pain which does not meet criteria for an inpatient psychiatric admission. There  is no current evidence of imminent risk to self or others at present that would further warrant an inpatient psychiatric admission as such, patient is psychiatrically cleared. Patients case was discussed with Dr. Lucianne Muss who agreed that a psychiatric admission was not warranted. Dr. Lucianne Muss did suggest that the EDP evaluate patients chief complaint which is generalized  abdominal pain.   Patient sated that she receives outpatient psychiatric services. I  reviewed patients chart and there were documentation that Dr. Neysa Hotter, psychiatry,  had been trying to schedule patient an appointment following a referral. It is recommended that Dr. Vanetta Shawl is contacted so an appoinemtn can be scheduled. I made an attempt to [name] [phone] to discuss disposition and recommendation to contact Dr.Hisada however, she could not be reached. Voice message left for a return phone call. I will discuss disposition, plan of care, and safety plan once [name] can be reached.   On a psychiatric standpoint, patient is psychiatrically cleared. ED updated on disposition.

## 2020-03-18 ENCOUNTER — Telehealth (HOSPITAL_COMMUNITY): Payer: Self-pay | Admitting: *Deleted

## 2020-03-18 NOTE — Telephone Encounter (Signed)
New Patient referral received to sch new pt appt for pt from Dr. Otilio Saber office. Staff called [phone] and [facility]

## 2020-03-23 ENCOUNTER — Telehealth: Payer: Self-pay | Admitting: Nurse Practitioner

## 2020-03-23 NOTE — Telephone Encounter (Signed)
Spoke with patient's daughter-in-law, [name], regarding Palliative services and she stated that patient is in the process of moving to [place] where her other son lives and possibly being placed in an assisted living facility there.  Noreene Larsson is not sure when this is suppose to occur, she is going to call her sister-in-law to see where there are in this process and will call me back.  Noreene Larsson requested that if it's going to be a while before she moves she would like to schedule a visit with [place].

## 2020-03-29 ENCOUNTER — Ambulatory Visit: Payer: Medicare Other

## 2020-03-29 ENCOUNTER — Other Ambulatory Visit: Payer: Self-pay

## 2020-03-29 ENCOUNTER — Ambulatory Visit (INDEPENDENT_AMBULATORY_CARE_PROVIDER_SITE_OTHER): Payer: Medicare Other | Admitting: Orthopedic Surgery

## 2020-03-29 ENCOUNTER — Encounter: Payer: Self-pay | Admitting: Orthopedic Surgery

## 2020-03-29 VITALS — BP 144/81 | HR 95 | Ht 60.0 in | Wt 110.0 lb

## 2020-03-29 DIAGNOSIS — S92344A Nondisplaced fracture of fourth metatarsal bone, right foot, initial encounter for closed fracture: Secondary | ICD-10-CM

## 2020-03-29 DIAGNOSIS — S72101D Unspecified trochanteric fracture of right femur, subsequent encounter for closed fracture with routine healing: Secondary | ICD-10-CM | POA: Diagnosis not present

## 2020-03-29 DIAGNOSIS — S92354A Nondisplaced fracture of fifth metatarsal bone, right foot, initial encounter for closed fracture: Secondary | ICD-10-CM

## 2020-03-29 NOTE — Progress Notes (Signed)
[facility]  03/29/2020  Body mass index is 21.48 kg/m.   S:  Chief Complaint  Patient presents with  . Foot Injury    fracture/ improving ? date of injury xray on 02/16/20   Patient says her pain is better her exam shows no tenderness foot alignment is normal toes are aligned well  Recommend return to normal activity follow-up with [place] as needed  F/U : IMAGING: Independent interpretation of images: Images of the left hip show a blade plate in good position with fracture healing   X-ray of the foot shows abnormality of the second third and fourth and fifth metatarsals   Outside records reviewed: no  MANAGEMENT    Hard soled shoe attempted first if not we will have to go to a cam walker.   WBAT with walker    X-rays in 6 weeks    No orders of the defined types were placed in this encounter.       O: BP (!) 144/81   Pulse 95   Ht 5' (1.524 m)   Wt 110 lb (49.9 kg)   BMI 21.48 kg/m   Physical Exam   See above  MEDICAL DECISION MAKING  Encounter Diagnoses  Name Primary?  . Closed nondisplaced fracture of fifth metatarsal bone of right foot, initial encounter   . Closed trochanteric fracture of right femur with routine healing   . Closed nondisplaced fracture of fourth metatarsal bone of right foot, initial encounter Yes      IMAGING:  Fractures have healed all 5   No orders of the defined types were placed in this encounter.     Fuller Canada, MD  03/29/2020 11:01 AM

## 2020-04-09 ENCOUNTER — Telehealth: Payer: Self-pay | Admitting: Nurse Practitioner

## 2020-04-09 NOTE — Telephone Encounter (Signed)
Spoke with daughter n law, Noreene Larsson, and she stated that patient has moved to [place] and is in an assisted living facility there, patient's son lives there.  Explained to Noreene Larsson that I would cancel this referral.

## 2020-04-22 ENCOUNTER — Ambulatory Visit: Payer: Medicare Other | Admitting: Gastroenterology

## 2020-04-26 ENCOUNTER — Institutional Professional Consult (permissible substitution): Payer: Medicare Other | Admitting: Pulmonary Disease

## 2020-07-24 IMAGING — CT CT HIP*R* W/O CM
2 of 3 series · 16 of 46 positions shown, 18 images · non-contrast
Comparison: Plain films right hip earlier today.

CLINICAL DATA: Right hip pain after a fall.  Initial encounter.

EXAM:
CT OF THE RIGHT HIP WITHOUT CONTRAST
TECHNIQUE: Multidetector CT imaging of the right hip was performed according to
the standard protocol. Multiplanar CT image reconstructions were
also generated.

[Series 6: 3 axial soft · axial · 0.50mm/px · z∈[+752,+912]mm · 13 of 92 slices shown, 15 images]
[im 6/92  soft-tissue]
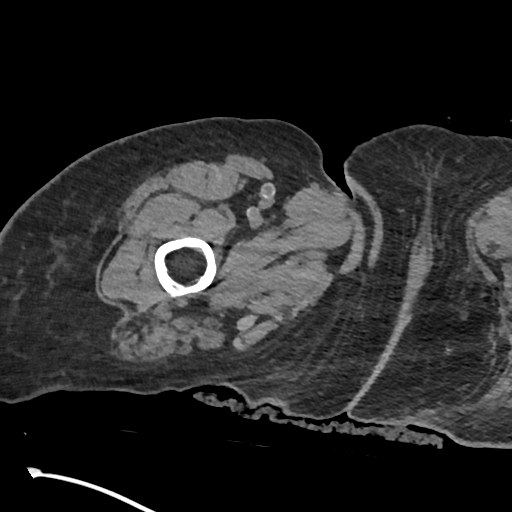
[im 6/92  bone]
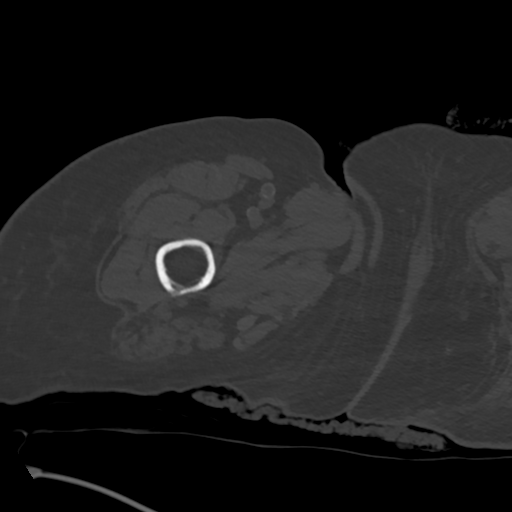
[im 12/92  soft-tissue]
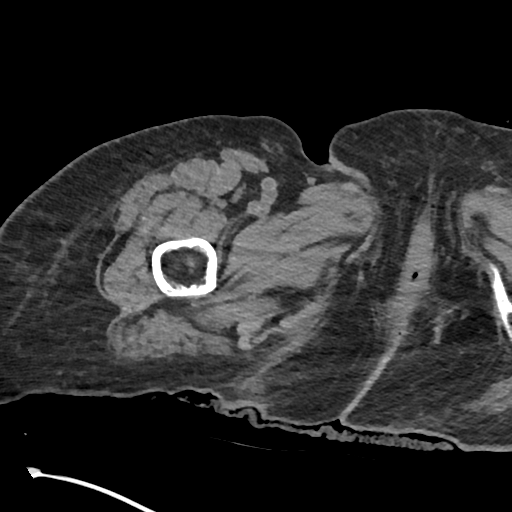
[im 18/92  soft-tissue]
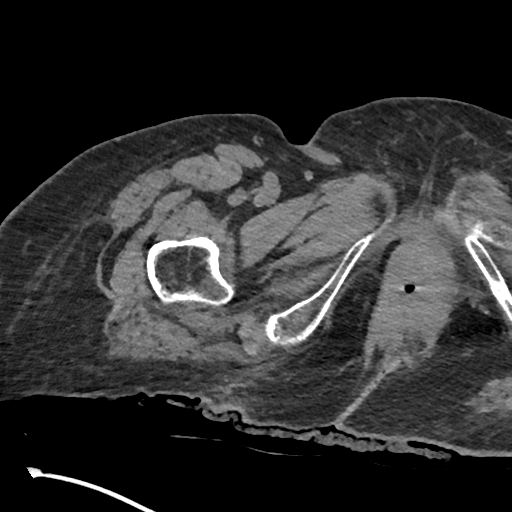
[im 27/92  soft-tissue]
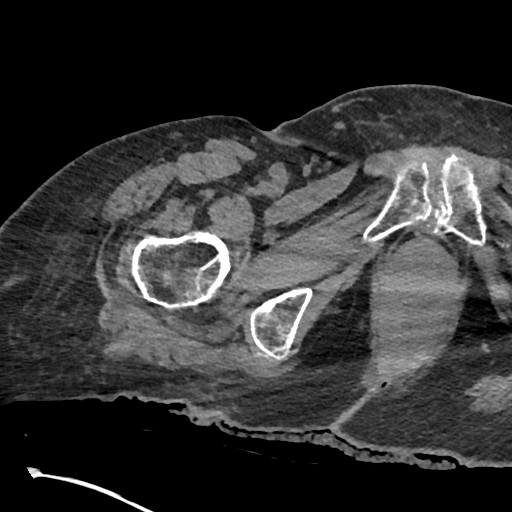
[im 33/92  soft-tissue]
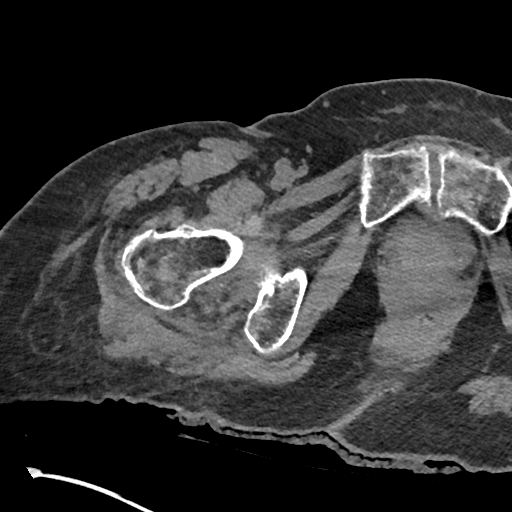
[im 39/92  soft-tissue]
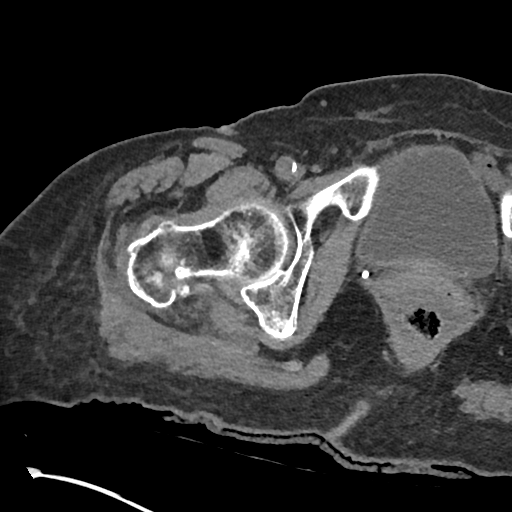
[im 47/92  soft-tissue]
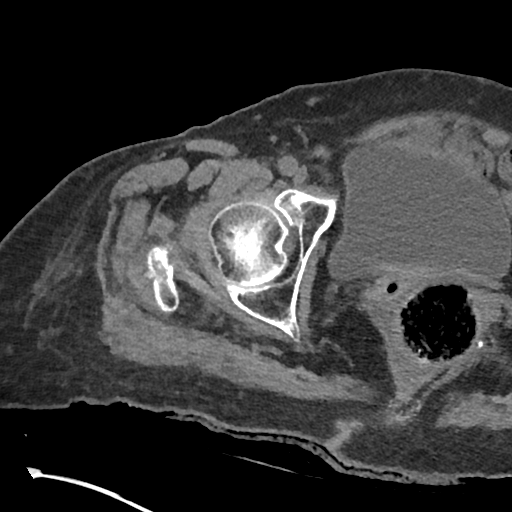
[im 53/92  soft-tissue]
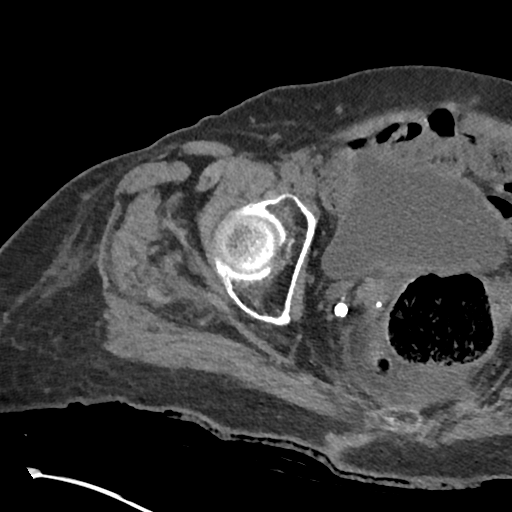
[im 59/92  soft-tissue]
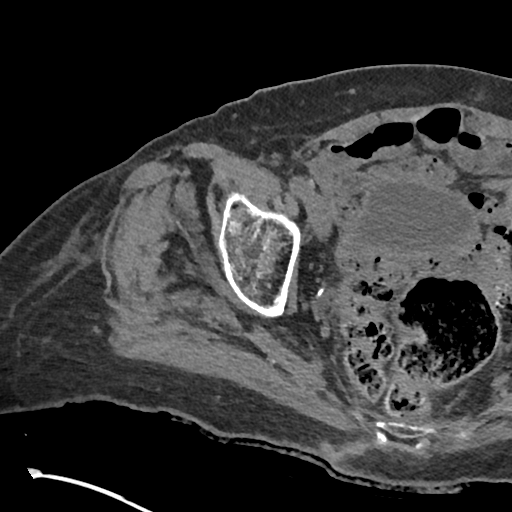
[im 59/92  bone]
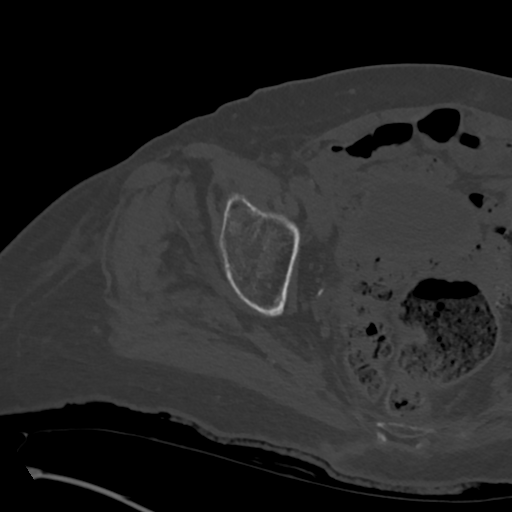
[im 65/92  soft-tissue]
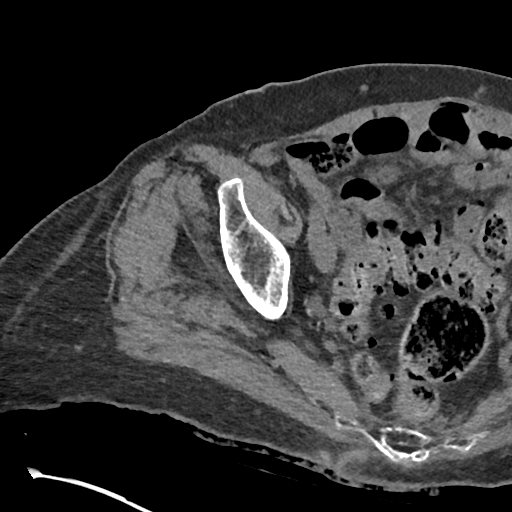
[im 74/92  soft-tissue]
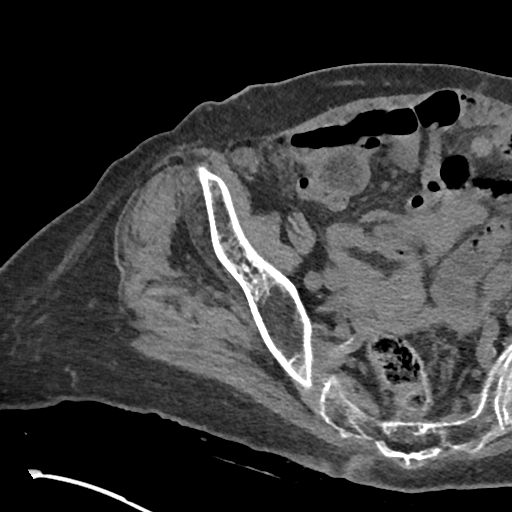
[im 80/92  soft-tissue]
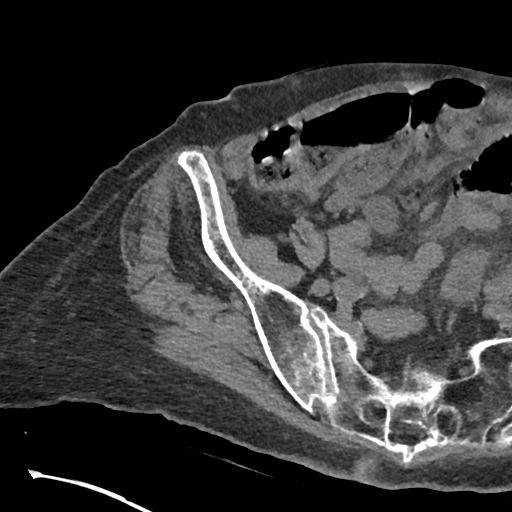
[im 86/92  soft-tissue]
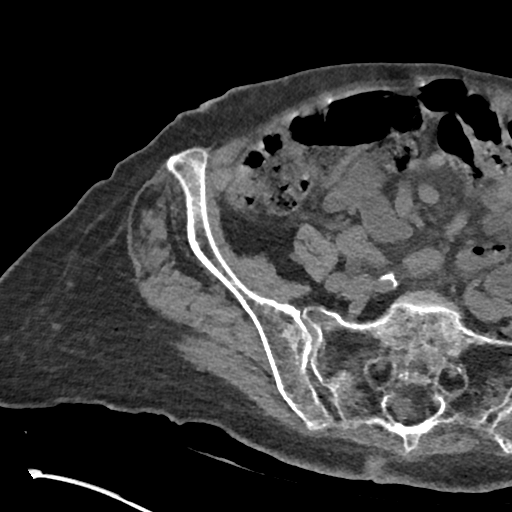

[Series 9: coronal st · coronal · 0.39mm/px · 3 of 108 slices shown]
[im 36/108  soft-tissue]
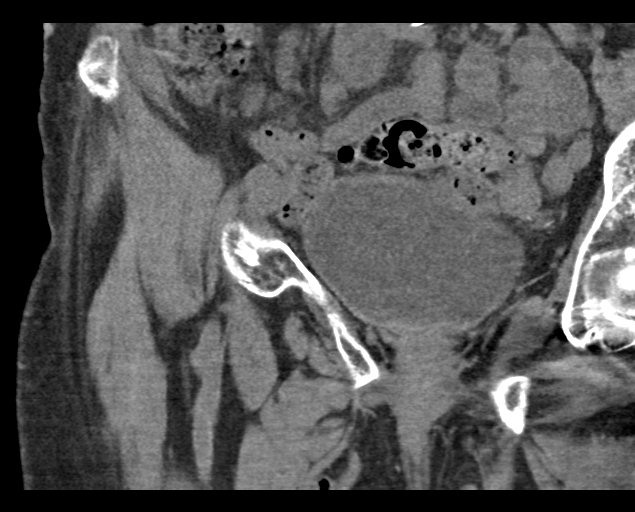
[im 48/108  soft-tissue]
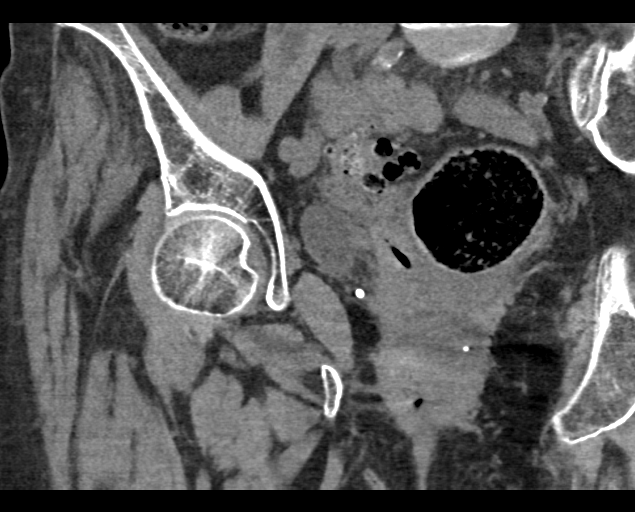
[im 60/108  soft-tissue]
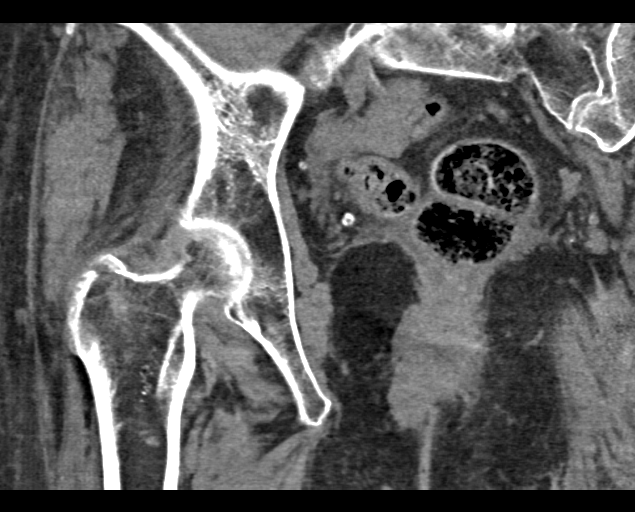

[16 of 46 positions shown; findings below may reference images not displayed]

FINDINGS: Bones/Joint/Cartilage

Bones are osteopenic. The patient has an acute fracture through the
base of the greater trochanter. The greater trochanter demonstrates
medial displacement of approximately 0.5 cm and mild superior
displacement. No extension of the fracture into the
intertrochanteric femur is identified.

There is buckling of the anterior cortex of the right sacrum
consistent with fracture, likely remote. Remote left pubic bone and
inferior pubic ramus fractures are partially imaged.

No lytic or sclerotic lesion is identified. The right femoral head
is located. Mild right hip joint space narrowing is noted.

Ligaments

Suboptimally assessed by CT.

Muscles and Tendons

Negative.

Soft tissues

Imaged intrapelvic contents demonstrate sigmoid diverticulosis. The
patient is status post hysterectomy.
IMPRESSION: Mildly displaced fracture of the right greater trochanter. No
intertrochanteric extension is identified but osteopenia limits
evaluation. If there is concern for intertrochanteric extension, MRI
without contrast is recommended for further evaluation.

Right sacral fracture appears remote. Remote left parasymphyseal and
inferior pubic ramus fractures are partially visualized.

## 2021-09-28 DEATH — deceased
